# Patient Record
Sex: Male | Born: 1955 | Race: White | Hispanic: No | Marital: Married | Smoking: Never smoker
Health system: Southern US, Community
[De-identification: ages and names within clinical notes are randomized; demographics above are authoritative.]

## PROBLEM LIST (undated history)

## (undated) DIAGNOSIS — I639 Cerebral infarction, unspecified: Secondary | ICD-10-CM

## (undated) DIAGNOSIS — E785 Hyperlipidemia, unspecified: Secondary | ICD-10-CM

## (undated) DIAGNOSIS — R413 Other amnesia: Secondary | ICD-10-CM

## (undated) DIAGNOSIS — R7303 Prediabetes: Secondary | ICD-10-CM

## (undated) DIAGNOSIS — I10 Essential (primary) hypertension: Secondary | ICD-10-CM

## (undated) HISTORY — DX: Essential (primary) hypertension: I10

## (undated) HISTORY — DX: Other amnesia: R41.3

## (undated) HISTORY — DX: Hyperlipidemia, unspecified: E78.5

## (undated) HISTORY — DX: Prediabetes: R73.03

## (undated) HISTORY — DX: Cerebral infarction, unspecified: I63.9

---

## 2015-06-17 ENCOUNTER — Encounter: Payer: Self-pay | Admitting: Emergency Medicine

## 2015-06-17 ENCOUNTER — Emergency Department
Admission: EM | Admit: 2015-06-17 | Discharge: 2015-06-18 | Disposition: A | Discharge status: Home / Self Care | Payer: BLUE CROSS/BLUE SHIELD | Attending: Emergency Medicine | Admitting: Emergency Medicine

## 2015-06-17 ENCOUNTER — Emergency Department
Admission: EM | Admit: 2015-06-17 | Disposition: A | Discharge status: Home / Self Care | Payer: BLUE CROSS/BLUE SHIELD | Source: Home / Self Care

## 2015-06-17 DIAGNOSIS — S50812A Abrasion of left forearm, initial encounter: Secondary | ICD-10-CM | POA: Insufficient documentation

## 2015-06-17 DIAGNOSIS — S0990XA Unspecified injury of head, initial encounter: Secondary | ICD-10-CM | POA: Diagnosis present

## 2015-06-17 DIAGNOSIS — I10 Essential (primary) hypertension: Secondary | ICD-10-CM

## 2015-06-17 DIAGNOSIS — Y92410 Unspecified street and highway as the place of occurrence of the external cause: Secondary | ICD-10-CM | POA: Insufficient documentation

## 2015-06-17 DIAGNOSIS — Y998 Other external cause status: Secondary | ICD-10-CM | POA: Insufficient documentation

## 2015-06-17 DIAGNOSIS — S0081XA Abrasion of other part of head, initial encounter: Secondary | ICD-10-CM | POA: Diagnosis not present

## 2015-06-17 DIAGNOSIS — Y9389 Activity, other specified: Secondary | ICD-10-CM | POA: Diagnosis not present

## 2015-06-17 DIAGNOSIS — T148XXA Other injury of unspecified body region, initial encounter: Secondary | ICD-10-CM

## 2015-06-17 DIAGNOSIS — Z23 Encounter for immunization: Secondary | ICD-10-CM | POA: Diagnosis not present

## 2015-06-17 MED ORDER — LORAZEPAM 2 MG/ML IJ SOLN
1.0000 mg | Freq: Once | INTRAMUSCULAR | Status: AC
  Administered 2015-06-17: 1 mg via INTRAVENOUS

## 2015-06-17 MED ORDER — LORAZEPAM 2 MG/ML IJ SOLN
INTRAMUSCULAR | Status: AC
  Administered 2015-06-17: 1 mg via INTRAVENOUS
  Filled 2015-06-17: qty 1
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
## 2015-06-18 DIAGNOSIS — S0081XA Abrasion of other part of head, initial encounter: Secondary | ICD-10-CM | POA: Diagnosis not present

## 2015-06-18 MED ORDER — TETANUS-DIPHTH-ACELL PERTUSSIS 5-2.5-18.5 LF-MCG/0.5 IM SUSP
0.5000 mL | Freq: Once | INTRAMUSCULAR | Status: AC
  Administered 2015-06-18: 0.5 mL via INTRAMUSCULAR

## 2015-06-18 MED ORDER — TETANUS-DIPHTH-ACELL PERTUSSIS 5-2.5-18.5 LF-MCG/0.5 IM SUSP
INTRAMUSCULAR | Status: AC
  Administered 2015-06-18: 0.5 mL via INTRAMUSCULAR
  Filled 2015-06-18: qty 0.5

## 2015-06-18 MED ORDER — HYDROCHLOROTHIAZIDE 25 MG PO TABS: 25.0000 mg | ORAL_TABLET | Freq: Every day | ORAL | Status: DC
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
## 2015-07-30 ENCOUNTER — Ambulatory Visit (INDEPENDENT_AMBULATORY_CARE_PROVIDER_SITE_OTHER): Payer: BLUE CROSS/BLUE SHIELD | Admitting: Family Medicine

## 2015-07-30 ENCOUNTER — Encounter: Payer: Self-pay | Admitting: Family Medicine

## 2015-07-30 ENCOUNTER — Ambulatory Visit: Payer: BLUE CROSS/BLUE SHIELD | Admitting: Family Medicine

## 2015-07-30 VITALS — BP 220/120 | HR 80 | Temp 98.8°F | Ht 67.0 in | Wt 201.1 lb

## 2015-07-30 DIAGNOSIS — Z125 Encounter for screening for malignant neoplasm of prostate: Secondary | ICD-10-CM

## 2015-07-30 DIAGNOSIS — I160 Hypertensive urgency: Secondary | ICD-10-CM

## 2015-07-30 DIAGNOSIS — Z1159 Encounter for screening for other viral diseases: Secondary | ICD-10-CM

## 2015-07-30 DIAGNOSIS — I10 Essential (primary) hypertension: Secondary | ICD-10-CM | POA: Insufficient documentation

## 2015-07-30 DIAGNOSIS — E669 Obesity, unspecified: Secondary | ICD-10-CM

## 2015-07-30 DIAGNOSIS — Z1211 Encounter for screening for malignant neoplasm of colon: Secondary | ICD-10-CM

## 2015-07-30 DIAGNOSIS — Z0000 Encounter for general adult medical examination without abnormal findings: Secondary | ICD-10-CM

## 2015-07-30 MED ORDER — AMLODIPINE BESYLATE 10 MG PO TABS: 10.0000 mg | ORAL_TABLET | Freq: Every day | ORAL | Status: DC

## 2015-07-30 MED ORDER — HYDROCHLOROTHIAZIDE 25 MG PO TABS
25.0000 mg | ORAL_TABLET | Freq: Every day | ORAL | Status: DC
Start: 2015-07-30 — End: 2016-07-18
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
## 2015-07-30 NOTE — Patient Instructions (Signed)
It was nice to see you today.  Take the medications daily as prescribed.  Follow up in 1-2 weeks for a BP check.  We will call with the results of your labs.  Take care  Dr. Adriana Simas  Health Maintenance A healthy lifestyle and preventative care can promote health and wellness.  Maintain regular health, dental, and eye exams.  Eat a healthy diet. Foods like vegetables, fruits, whole grains, low-fat dairy products, and lean protein foods contain the nutrients you need and are low in calories. Decrease your intake of foods high in solid fats, added sugars, and salt. Get information about a proper diet from your health care provider, if necessary.  Regular physical exercise is one of the most important things you can do for your health. Most adults should get at least 150 minutes of moderate-intensity exercise (any activity that increases your heart rate and causes you to sweat) each week. In addition, most adults need muscle-strengthening exercises on 2 or more days a week.   Maintain a healthy weight. The body mass index (BMI) is a screening tool to identify possible weight problems. It provides an estimate of body fat based on height and weight. Your health care provider can find your BMI and can help you achieve or maintain a healthy weight. For males 20 years and older:  A BMI below 18.5 is considered underweight.  A BMI of 18.5 to 24.9 is normal.  A BMI of 25 to 29.9 is considered overweight.  A BMI of 30 and above is considered obese.  Maintain normal blood lipids and cholesterol by exercising and minimizing your intake of saturated fat. Eat a balanced diet with plenty of fruits and vegetables. Blood tests for lipids and cholesterol should begin at age 21 and be repeated every 5 years. If your lipid or cholesterol levels are high, you are over age 19, or you are at high risk for heart disease, you may need your cholesterol levels checked more frequently.Ongoing high lipid and  cholesterol levels should be treated with medicines if diet and exercise are not working.  If you smoke, find out from your health care provider how to quit. If you do not use tobacco, do not start.  Lung cancer screening is recommended for adults aged 55-80 years who are at high risk for developing lung cancer because of a history of smoking. A yearly low-dose CT scan of the lungs is recommended for people who have at least a 30-pack-year history of smoking and are current smokers or have quit within the past 15 years. A pack year of smoking is smoking an average of 1 pack of cigarettes a day for 1 year (for example, a 30-pack-year history of smoking could mean smoking 1 pack a day for 30 years or 2 packs a day for 15 years). Yearly screening should continue until the smoker has stopped smoking for at least 15 years. Yearly screening should be stopped for people who develop a health problem that would prevent them from having lung cancer treatment.  If you choose to drink alcohol, do not have more than 2 drinks per day. One drink is considered to be 12 oz (360 mL) of beer, 5 oz (150 mL) of wine, or 1.5 oz (45 mL) of liquor.  Avoid the use of street drugs. Do not share needles with anyone. Ask for help if you need support or instructions about stopping the use of drugs.  High blood pressure causes heart disease and increases the risk of  stroke. Blood pressure should be checked at least every 1-2 years. Ongoing high blood pressure should be treated with medicines if weight loss and exercise are not effective.  If you are 75-51 years old, ask your health care provider if you should take aspirin to prevent heart disease.  Diabetes screening involves taking a blood sample to check your fasting blood sugar level. This should be done once every 3 years after age 68 if you are at a normal weight and without risk factors for diabetes. Testing should be considered at a younger age or be carried out more  frequently if you are overweight and have at least 1 risk factor for diabetes.  Colorectal cancer can be detected and often prevented. Most routine colorectal cancer screening begins at the age of 81 and continues through age 40. However, your health care provider may recommend screening at an earlier age if you have risk factors for colon cancer. On a yearly basis, your health care provider may provide home test kits to check for hidden blood in the stool. A small camera at the end of a tube may be used to directly examine the colon (sigmoidoscopy or colonoscopy) to detect the earliest forms of colorectal cancer. Talk to your health care provider about this at age 74 when routine screening begins. A direct exam of the colon should be repeated every 5-10 years through age 11, unless early forms of precancerous polyps or small growths are found.  People who are at an increased risk for hepatitis B should be screened for this virus. You are considered at high risk for hepatitis B if:  You were born in a country where hepatitis B occurs often. Talk with your health care provider about which countries are considered high risk.  Your parents were born in a high-risk country and you have not received a shot to protect against hepatitis B (hepatitis B vaccine).  You have HIV or AIDS.  You use needles to inject street drugs.  You live with, or have sex with, someone who has hepatitis B.  You are a man who has sex with other men (MSM).  You get hemodialysis treatment.  You take certain medicines for conditions like cancer, organ transplantation, and autoimmune conditions.  Hepatitis C blood testing is recommended for all people born from [birth year] through 1965 and any individual with known risk factors for hepatitis C.  Healthy men should no longer receive prostate-specific antigen (PSA) blood tests as part of routine cancer screening. Talk to your health care provider about prostate cancer  screening.  Testicular cancer screening is not recommended for adolescents or adult males who have no symptoms. Screening includes self-exam, a health care provider exam, and other screening tests. Consult with your health care provider about any symptoms you have or any concerns you have about testicular cancer.  Practice safe sex. Use condoms and avoid high-risk sexual practices to reduce the spread of sexually transmitted infections (STIs).  You should be screened for STIs, including gonorrhea and chlamydia if:  You are sexually active and are younger than 24 years.  You are older than 24 years, and your health care provider tells you that you are at risk for this type of infection.  Your sexual activity has changed since you were last screened, and you are at an increased risk for chlamydia or gonorrhea. Ask your health care provider if you are at risk.  If you are at risk of being infected with HIV, it is recommended that  a prescription medicine daily to prevent HIV infection. This is called pre-exposure prophylaxis (PrEP). You are considered at risk if:  You are a man who has sex with other men (MSM).  You are a heterosexual man who is sexually active with multiple partners.  You take drugs by injection.  You are sexually active with a partner who has HIV.  Talk with your health care provider about whether you are at high risk of being infected with HIV. If you choose to begin PrEP, you should first be tested for HIV. You should then be tested every 3 months for as long as you are taking PrEP.  Use sunscreen. Apply sunscreen liberally and repeatedly throughout the day. You should seek shade when your shadow is shorter than you. Protect yourself by wearing long sleeves, pants, a wide-brimmed hat, and sunglasses year round whenever you are outdoors.  Tell your health care provider of new moles or changes in moles, especially if there is a change in shape or color. Also, tell  your health care provider if a mole is larger than the size of a pencil eraser.  A one-time screening for abdominal aortic aneurysm (AAA) and surgical repair of large AAAs by ultrasound is recommended for men aged 65-75 years who are current or former smokers.  Stay current with your vaccines (immunizations). Document Released: 05/26/2008 Document Revised: 12/03/2013 Document Reviewed: 04/25/2011 Exit[employer] Patient Information 2015 [employer]. This information is not intended to replace advice given to you by your health care provider. Make sure you discuss any questions you have with your health care provider.  

## 2015-07-30 NOTE — Progress Notes (Signed)
Pre visit review using our clinic review tool, if applicable. No additional management support is needed unless otherwise documented below in the visit note. 

## 2015-07-31 ENCOUNTER — Encounter: Payer: Self-pay | Admitting: Family Medicine

## 2015-07-31 DIAGNOSIS — Z0000 Encounter for general adult medical examination without abnormal findings: Secondary | ICD-10-CM | POA: Insufficient documentation

## 2015-07-31 DIAGNOSIS — I10 Essential (primary) hypertension: Secondary | ICD-10-CM | POA: Insufficient documentation

## 2015-07-31 LAB — COMPREHENSIVE METABOLIC PANEL
ALBUMIN: 4.7 g/dL (ref 3.5–5.2)
ALK PHOS: 67 U/L (ref 39–117)
ALT: 65 U/L — ABNORMAL HIGH (ref 0–53)
AST: 40 U/L — AB (ref 0–37)
BILIRUBIN TOTAL: 0.8 mg/dL (ref 0.2–1.2)
BUN: 12 mg/dL (ref 6–23)
CALCIUM: 9.9 mg/dL (ref 8.4–10.5)
CHLORIDE: 102 meq/L (ref 96–112)
CO2: 27 mEq/L (ref 19–32)
CREATININE: 1.16 mg/dL (ref 0.40–1.50)
GFR: 68.45 mL/min (ref >60.00)
Glucose, Bld: 100 mg/dL — ABNORMAL HIGH (ref 70–99)
Potassium: 4.3 mEq/L (ref 3.5–5.1)
Sodium: 140 mEq/L (ref 135–145)
TOTAL PROTEIN: 7.4 g/dL (ref 6.0–8.3)

## 2015-07-31 LAB — PSA: PSA: 1.15 ng/mL (ref 0.10–4.00)

## 2015-07-31 LAB — HEMOGLOBIN A1C: Hgb A1c MFr Bld: 5.4 % (ref 4.6–6.5)

## 2015-07-31 LAB — MICROALBUMIN / CREATININE URINE RATIO
Creatinine,U: 86 mg/dL
MICROALB UR: 1.2 mg/dL (ref 0.0–1.9)
Microalb Creat Ratio: 1.4 mg/g (ref 0.0–30.0)

## 2015-07-31 LAB — HEPATITIS C ANTIBODY: HCV Ab: NEGATIVE

## 2015-07-31 NOTE — Assessment & Plan Note (Signed)
Immunizations up to date. Labs: Lipid, CMP, A1C. Patient and I had a long discussion about prostate cancer screening. Will proceed with PSA screening. Placing referral for colonoscopy.

## 2015-07-31 NOTE — Assessment & Plan Note (Addendum)
Patient's blood pressure severely elevated today. Patient asymptomatic. With no physical exam signs of end organ damage. No prior labs in the EMR. Obtaining metabolic panel today to ensure no underlying renal disease or other abnormalities to suggest secondary cause of HTN. Starting patient on Norvasc and HCTZ. Follow up in 1-2 weeks for BP recheck.

## 2015-08-19 ENCOUNTER — Ambulatory Visit (INDEPENDENT_AMBULATORY_CARE_PROVIDER_SITE_OTHER): Payer: BLUE CROSS/BLUE SHIELD

## 2015-08-19 ENCOUNTER — Telehealth: Payer: Self-pay

## 2015-08-19 VITALS — BP 148/88 | HR 95 | Resp 18

## 2015-08-19 DIAGNOSIS — I10 Essential (primary) hypertension: Secondary | ICD-10-CM

## 2015-08-19 DIAGNOSIS — I160 Hypertensive urgency: Secondary | ICD-10-CM

## 2015-08-19 NOTE — Progress Notes (Signed)
Very pleased with BP reduction. Will continue current regimen.  Follow up BP check in 2 weeks. Goal <140/90

## 2015-08-19 NOTE — Progress Notes (Signed)
Patient came in for BP check.  Patient is BP checked in both arms.  Per patient he is taking both medications per ordered (Norvasc and HCTZ) daily.  Has had no major issues with the exception of one evening being lethargic and no other symptoms.  Please advise if you would like any changes.  Patient would like a return call at [phone].

## 2015-08-19 NOTE — Telephone Encounter (Signed)
-----   Message from Tommie Sams, DO sent at 08/19/2015  8:51 AM EDT -----   ----- Message -----    From: Elvia Collum, RN    Sent: 08/19/2015   8:15 AM      To: Tommie Sams, DO

## 2015-08-19 NOTE — Telephone Encounter (Signed)
Called patient to congratulate him on getting his BP down. He will call back and schedule his 2 wk BP check.

## 2015-08-25 ENCOUNTER — Other Ambulatory Visit (INDEPENDENT_AMBULATORY_CARE_PROVIDER_SITE_OTHER): Payer: BLUE CROSS/BLUE SHIELD

## 2015-08-25 ENCOUNTER — Other Ambulatory Visit (INDEPENDENT_AMBULATORY_CARE_PROVIDER_SITE_OTHER): Payer: BLUE CROSS/BLUE SHIELD | Admitting: *Deleted

## 2015-08-25 DIAGNOSIS — I10 Essential (primary) hypertension: Secondary | ICD-10-CM

## 2015-08-25 DIAGNOSIS — R945 Abnormal results of liver function studies: Principal | ICD-10-CM

## 2015-08-25 DIAGNOSIS — R7989 Other specified abnormal findings of blood chemistry: Secondary | ICD-10-CM

## 2015-08-26 LAB — COMPREHENSIVE METABOLIC PANEL
ALT: 36 U/L (ref 0–53)
AST: 23 U/L (ref 0–37)
Albumin: 4.4 g/dL (ref 3.5–5.2)
Alkaline Phosphatase: 68 U/L (ref 39–117)
BILIRUBIN TOTAL: 0.8 mg/dL (ref 0.2–1.2)
BUN: 20 mg/dL (ref 6–23)
CHLORIDE: 98 meq/L (ref 96–112)
CO2: 31 meq/L (ref 19–32)
Calcium: 10 mg/dL (ref 8.4–10.5)
Creatinine, Ser: 1.33 mg/dL (ref 0.40–1.50)
GFR: 58.44 mL/min — AB (ref >60.00)
Glucose, Bld: 102 mg/dL — ABNORMAL HIGH (ref 70–99)
POTASSIUM: 3.8 meq/L (ref 3.5–5.1)
Sodium: 139 mEq/L (ref 135–145)
Total Protein: 7 g/dL (ref 6.0–8.3)

## 2015-10-05 ENCOUNTER — Ambulatory Visit: Payer: BLUE CROSS/BLUE SHIELD | Admitting: *Deleted

## 2015-10-05 ENCOUNTER — Encounter
Admission: RE | Disposition: A | Discharge status: Home / Self Care | Payer: Self-pay | Source: Ambulatory Visit | Attending: Unknown Physician Specialty

## 2015-10-05 ENCOUNTER — Encounter: Payer: Self-pay | Admitting: Anesthesiology

## 2015-10-05 ENCOUNTER — Ambulatory Visit
Admission: RE | Admit: 2015-10-05 | Discharge: 2015-10-05 | Disposition: A | Discharge status: Home / Self Care | Payer: BLUE CROSS/BLUE SHIELD | Source: Ambulatory Visit | Attending: Unknown Physician Specialty | Admitting: Unknown Physician Specialty

## 2015-10-05 DIAGNOSIS — K640 First degree hemorrhoids: Secondary | ICD-10-CM | POA: Diagnosis not present

## 2015-10-05 DIAGNOSIS — K621 Rectal polyp: Secondary | ICD-10-CM | POA: Insufficient documentation

## 2015-10-05 DIAGNOSIS — K635 Polyp of colon: Secondary | ICD-10-CM | POA: Diagnosis not present

## 2015-10-05 DIAGNOSIS — Z1211 Encounter for screening for malignant neoplasm of colon: Secondary | ICD-10-CM | POA: Diagnosis not present

## 2015-10-05 DIAGNOSIS — I10 Essential (primary) hypertension: Secondary | ICD-10-CM | POA: Diagnosis not present

## 2015-10-05 HISTORY — PX: COLONOSCOPY WITH PROPOFOL: SHX5780

## 2015-10-05 SURGERY — COLONOSCOPY WITH PROPOFOL
Anesthesia: General

## 2015-10-05 MED ORDER — LACTATED RINGERS IV SOLN
INTRAVENOUS | Status: DC | PRN
  Administered 2015-10-05: 14:00:00 via INTRAVENOUS

## 2015-10-05 MED ORDER — SODIUM CHLORIDE 0.9 % IV SOLN
INTRAVENOUS | Status: DC
  Administered 2015-10-05: 14:00:00 via INTRAVENOUS

## 2015-10-05 MED ORDER — PROPOFOL 500 MG/50ML IV EMUL
INTRAVENOUS | Status: DC | PRN
  Administered 2015-10-05: 100 ug/kg/min via INTRAVENOUS

## 2015-10-05 MED ORDER — SODIUM CHLORIDE 0.9 % IV SOLN: INTRAVENOUS | Status: DC

## 2015-10-05 NOTE — Op Note (Signed)
[facility] Gastroenterology Patient Name: Jesse Black Procedure Date: 10/05/2015 2:03 PM MRN: [id number] Account #: [id number] Date of Birth: 07/22/1956 Admit Type: Outpatient Age: 59 Room: [facility] ENDO ROOM 1 Gender: Male Note Status: Finalized Procedure:         Colonoscopy Indications:       Screening for colorectal malignant neoplasm Providers:         Scot Jun, MD Referring MD:      No Local Md, MD (Referring MD) Medicines:         Propofol per Anesthesia Complications:     No immediate complications. Procedure:         Pre-Anesthesia Assessment:                    - After reviewing the risks and benefits, the patient was                     deemed in satisfactory condition to undergo the procedure.                    After obtaining informed consent, the colonoscope was                     passed under direct vision. Throughout the procedure, the                     patient's blood pressure, pulse, and oxygen saturations                     were monitored continuously. The Colonoscope was                     introduced through the anus and advanced to the the cecum,                     identified by appendiceal orifice and ileocecal valve. The                     colonoscopy was performed without difficulty. The patient                     tolerated the procedure well. The quality of the bowel                     preparation was excellent. Findings:      Two sessile polyps were found in the sigmoid colon. The polyps were       diminutive in size. These polyps were removed with a jumbo cold forceps.       Resection and retrieval were complete.      A diminutive polyp was found in the rectum. The polyp was sessile. The       polyp was removed with a jumbo cold forceps. Resection and retrieval       were complete.      The exam was otherwise without abnormality.      Internal hemorrhoids were found during endoscopy. The hemorrhoids were       small,  medium-sized and Grade I (internal hemorrhoids that do not       prolapse).      The exam was otherwise without abnormality. Impression:        - Two diminutive polyps in the sigmoid colon. Resected and  retrieved.                    - One diminutive polyp in the rectum. Resected and                     retrieved.                    - The examination was otherwise normal. Recommendation:    - Await pathology results. Scot Junobert T [name], MD 10/05/2015 2:31:48 PM This report has been signed electronically. Number of Addenda: 0 Note Initiated On: 10/05/2015 2:03 PM Scope Withdrawal Time: 0 hours 16 minutes 27 seconds  Total Procedure Duration: 0 hours 20 minutes 39 seconds       [facility]

## 2015-10-05 NOTE — Transfer of Care (Signed)
Immediate Anesthesia Transfer of Care Note  Patient: Jesse Black  Procedure(s) Performed: Procedure(s): COLONOSCOPY WITH PROPOFOL (N/A)  Patient Location: PACU  Anesthesia Type:General  Level of Consciousness: awake, alert  and oriented  Airway & Oxygen Therapy: Patient Spontanous Breathing and Patient connected to nasal cannula oxygen  Post-op Assessment: Report given to RN and Post -op Vital signs reviewed and stable  Post vital signs: Reviewed and stable  Last Vitals:  Filed Vitals:   10/05/15 1350  BP: 148/117  Pulse: 98  Temp: 36.8 C  Resp: 17    Complications: No apparent anesthesia complications

## 2015-10-05 NOTE — Anesthesia Preprocedure Evaluation (Addendum)
Anesthesia Evaluation  Patient identified by MRN, date of birth, ID band Patient awake, Patient confused and Patient unresponsive    Reviewed: Allergy & Precautions, H&P , NPO status , Patient's Chart, lab work & pertinent test results, reviewed documented beta blocker date and time   History of Anesthesia Complications Negative for: history of anesthetic complications  Airway Mallampati: II  TM Distance: >3 FB Neck ROM: full    Dental no notable dental hx.    Pulmonary neg pulmonary ROS,    Pulmonary exam normal breath sounds clear to auscultation       Cardiovascular Exercise Tolerance: Good hypertension, On Medications (-) angina(-) CAD, (-) Past MI, (-) Cardiac Stents and (-) CABG Normal cardiovascular exam(-) dysrhythmias (-) Valvular Problems/Murmurs Rhythm:Regular Rate:Normal     Neuro/Psych negative neurological ROS  negative psych ROS   GI/Hepatic negative GI ROS, Neg liver ROS,   Endo/Other  negative endocrine ROS  Renal/GU negative Renal ROS  negative genitourinary   Musculoskeletal negative musculoskeletal ROS (+)   Abdominal Normal abdominal exam  (+)   Peds negative pediatric ROS (+)  Hematology negative hematology ROS (+)   Anesthesia Other Findings Past Medical History:   High blood pressure                                          Reproductive/Obstetrics negative OB ROS                            Anesthesia Physical Anesthesia Plan  ASA: II  Anesthesia Plan: General   Post-op Pain Management:    Induction: Intravenous  Airway Management Planned:   Additional Equipment:   Intra-op Plan:   Post-operative Plan:   Informed Consent: I have reviewed the patients History and Physical, chart, labs and discussed the procedure including the risks, benefits and alternatives for the proposed anesthesia with the patient or authorized representative who has indicated  his/her understanding and acceptance.   Dental advisory given  Plan Discussed with: CRNA, Surgeon and Anesthesiologist  Anesthesia Plan Comments:        Anesthesia Quick Evaluation

## 2015-10-05 NOTE — H&P (Signed)
   Primary Care Physician:  Everlene OtherJayce Cook, DO Primary Gastroenterologist:  Dr. Mechele CollinElliott  Pre-Procedure History & Physical: HPI:  Jesse CossMark Austin Dorton is a 59 y.o. male is here for an colonoscopy.   Past Medical History  Diagnosis Date  . High blood pressure     Past Surgical History  Procedure Laterality Date  . No past surgeries      Prior to Admission medications   Medication Sig Start Date End Date Taking? Authorizing Provider  amLODipine (NORVASC) 10 MG tablet Take 1 tablet (10 mg total) by mouth daily. 07/30/15  Yes Tommie SamsJayce G Cook, DO  hydrochlorothiazide (HYDRODIURIL) 25 MG tablet Take 1 tablet (25 mg total) by mouth daily. 07/30/15   Tommie SamsJayce G Cook, DO    Allergies as of 09/04/2015  . (No Known Allergies)    Family History  Problem Relation Age of Onset  . Lung cancer Father     Social History   Social History  . Marital Status: Married    Spouse Name: N/A  . Number of Children: N/A  . Years of Education: N/A   Occupational History  . Not on file.   Social History Main Topics  . Smoking status: Never Smoker   . Smokeless tobacco: Never Used  . Alcohol Use: No     Comment: occ  . Drug Use: No  . Sexual Activity: Yes   Other Topics Concern  . Not on file   Social History Narrative    Review of Systems: See HPI, otherwise negative ROS  Physical Exam: BP 148/117 mmHg  Pulse 98  Temp(Src) 98.2 F (36.8 C) (Oral)  Resp 17  Ht 5\' 7"  (1.702 m)  Wt 86.183 kg (190 lb)  BMI 29.75 kg/m2  SpO2 100% General:   Alert,  pleasant and cooperative in NAD Head:  Normocephalic and atraumatic. Neck:  Supple; no masses or thyromegaly. Lungs:  Clear throughout to auscultation.    Heart:  Regular rate and rhythm. Abdomen:  Soft, nontender and nondistended. Normal bowel sounds, without guarding, and without rebound.   Neurologic:  Alert and  oriented x4;  grossly normal neurologically.  Impression/Plan: Jesse Black is here for an colonoscopy to be performed  for screening  Risks, benefits, limitations, and alternatives regarding  colonoscopy have been reviewed with the patient.  Questions have been answered.  All parties agreeable.   Lynnae PrudeELLIOTT, ROBERT, MD  10/05/2015, 1:58 PM

## 2015-10-06 NOTE — Anesthesia Postprocedure Evaluation (Signed)
  Anesthesia Post-op Note  Patient: Maximiano CossMark Austin Bogdon  Procedure(s) Performed: Procedure(s): COLONOSCOPY WITH PROPOFOL (N/A)  Anesthesia type:General  Patient location: PACU  Post pain: Pain level controlled  Post assessment: Post-op Vital signs reviewed, Patient's Cardiovascular Status Stable, Respiratory Function Stable, Patent Airway and No signs of Nausea or vomiting  Post vital signs: Reviewed and stable  Last Vitals:  Filed Vitals:   10/05/15 1500  BP: 119/80  Pulse:   Temp:   Resp:     Level of consciousness: awake, alert  and patient cooperative  Complications: No apparent anesthesia complications

## 2015-10-08 ENCOUNTER — Encounter: Payer: Self-pay | Admitting: Unknown Physician Specialty

## 2015-10-08 LAB — SURGICAL PATHOLOGY

## 2016-07-18 ENCOUNTER — Other Ambulatory Visit: Payer: Self-pay | Admitting: Family Medicine

## 2016-07-18 DIAGNOSIS — I160 Hypertensive urgency: Secondary | ICD-10-CM

## 2017-07-21 ENCOUNTER — Other Ambulatory Visit: Payer: Self-pay | Admitting: Family Medicine

## 2017-07-21 DIAGNOSIS — I160 Hypertensive urgency: Secondary | ICD-10-CM

## 2018-07-12 ENCOUNTER — Other Ambulatory Visit: Payer: Self-pay | Admitting: Family Medicine

## 2018-07-12 DIAGNOSIS — I160 Hypertensive urgency: Secondary | ICD-10-CM

## 2019-01-24 ENCOUNTER — Other Ambulatory Visit: Payer: Self-pay

## 2019-01-24 ENCOUNTER — Emergency Department: Payer: BLUE CROSS/BLUE SHIELD

## 2019-01-24 ENCOUNTER — Encounter: Payer: Self-pay | Admitting: Emergency Medicine

## 2019-01-24 ENCOUNTER — Inpatient Hospital Stay
Admission: EM | Admit: 2019-01-24 | Discharge: 2019-01-25 | DRG: 066 | Disposition: A | Discharge status: Home / Self Care | Payer: BLUE CROSS/BLUE SHIELD | Attending: Internal Medicine | Admitting: Internal Medicine

## 2019-01-24 DIAGNOSIS — R945 Abnormal results of liver function studies: Secondary | ICD-10-CM | POA: Diagnosis present

## 2019-01-24 DIAGNOSIS — I160 Hypertensive urgency: Secondary | ICD-10-CM | POA: Diagnosis not present

## 2019-01-24 DIAGNOSIS — Z79899 Other long term (current) drug therapy: Secondary | ICD-10-CM

## 2019-01-24 DIAGNOSIS — R29700 NIHSS score 0: Secondary | ICD-10-CM | POA: Diagnosis not present

## 2019-01-24 DIAGNOSIS — I6612 Occlusion and stenosis of left anterior cerebral artery: Secondary | ICD-10-CM | POA: Diagnosis not present

## 2019-01-24 DIAGNOSIS — I6381 Other cerebral infarction due to occlusion or stenosis of small artery: Principal | ICD-10-CM | POA: Diagnosis present

## 2019-01-24 DIAGNOSIS — I639 Cerebral infarction, unspecified: Secondary | ICD-10-CM | POA: Diagnosis not present

## 2019-01-24 DIAGNOSIS — R4789 Other speech disturbances: Secondary | ICD-10-CM | POA: Diagnosis present

## 2019-01-24 DIAGNOSIS — I10 Essential (primary) hypertension: Secondary | ICD-10-CM | POA: Diagnosis not present

## 2019-01-24 DIAGNOSIS — R260 Ataxic gait: Secondary | ICD-10-CM | POA: Diagnosis not present

## 2019-01-24 DIAGNOSIS — G459 Transient cerebral ischemic attack, unspecified: Secondary | ICD-10-CM | POA: Diagnosis not present

## 2019-01-24 DIAGNOSIS — I6602 Occlusion and stenosis of left middle cerebral artery: Secondary | ICD-10-CM | POA: Diagnosis not present

## 2019-01-24 DIAGNOSIS — I63231 Cerebral infarction due to unspecified occlusion or stenosis of right carotid arteries: Secondary | ICD-10-CM | POA: Diagnosis not present

## 2019-01-24 DIAGNOSIS — I63532 Cerebral infarction due to unspecified occlusion or stenosis of left posterior cerebral artery: Secondary | ICD-10-CM | POA: Diagnosis not present

## 2019-01-24 DIAGNOSIS — I6623 Occlusion and stenosis of bilateral posterior cerebral arteries: Secondary | ICD-10-CM | POA: Diagnosis not present

## 2019-01-24 DIAGNOSIS — E785 Hyperlipidemia, unspecified: Secondary | ICD-10-CM | POA: Diagnosis not present

## 2019-01-24 DIAGNOSIS — R4182 Altered mental status, unspecified: Secondary | ICD-10-CM | POA: Diagnosis not present

## 2019-01-24 LAB — CBC
HCT: 43.1 % (ref 39.0–52.0)
Hemoglobin: 15.5 g/dL (ref 13.0–17.0)
MCH: 34.1 pg — ABNORMAL HIGH (ref 26.0–34.0)
MCHC: 36 g/dL (ref 30.0–36.0)
MCV: 94.7 fL (ref 80.0–100.0)
NRBC: 0 % (ref 0.0–0.2)
PLATELETS: 224 10*3/uL (ref 150–400)
RBC: 4.55 MIL/uL (ref 4.22–5.81)
RDW: 12.7 % (ref 11.5–15.5)
WBC: 9 10*3/uL (ref 4.0–10.5)

## 2019-01-24 LAB — URINALYSIS, COMPLETE (UACMP) WITH MICROSCOPIC
BACTERIA UA: NONE SEEN
Bilirubin Urine: NEGATIVE
Glucose, UA: NEGATIVE mg/dL
Hgb urine dipstick: NEGATIVE
Ketones, ur: NEGATIVE mg/dL
Leukocytes,Ua: NEGATIVE
NITRITE: NEGATIVE
PH: 6 (ref 5.0–8.0)
Protein, ur: NEGATIVE mg/dL
SPECIFIC GRAVITY, URINE: 1.012 (ref 1.005–1.030)
SQUAMOUS EPITHELIAL / LPF: NONE SEEN (ref 0–5)

## 2019-01-24 LAB — COMPREHENSIVE METABOLIC PANEL
ALT: 106 U/L — AB (ref 0–44)
AST: 62 U/L — AB (ref 15–41)
Albumin: 4 g/dL (ref 3.5–5.0)
Alkaline Phosphatase: 56 U/L (ref 38–126)
Anion gap: 6 (ref 5–15)
BUN: 12 mg/dL (ref 8–23)
CHLORIDE: 103 mmol/L (ref 98–111)
CO2: 30 mmol/L (ref 22–32)
CREATININE: 1.25 mg/dL — AB (ref 0.61–1.24)
Calcium: 9 mg/dL (ref 8.9–10.3)
GFR calc Af Amer: >60 mL/min (ref >60)
GFR calc non Af Amer: >60 mL/min (ref >60)
Glucose, Bld: 117 mg/dL — ABNORMAL HIGH (ref 70–99)
POTASSIUM: 3.6 mmol/L (ref 3.5–5.1)
SODIUM: 139 mmol/L (ref 135–145)
Total Bilirubin: 1 mg/dL (ref 0.3–1.2)
Total Protein: 7.1 g/dL (ref 6.5–8.1)

## 2019-01-24 LAB — URINE DRUG SCREEN, QUALITATIVE (ARMC ONLY)
AMPHETAMINES, UR SCREEN: NOT DETECTED
Barbiturates, Ur Screen: NOT DETECTED
Benzodiazepine, Ur Scrn: NOT DETECTED
CANNABINOID 50 NG, UR ~~LOC~~: NOT DETECTED
COCAINE METABOLITE, UR ~~LOC~~: NOT DETECTED
MDMA (ECSTASY) UR SCREEN: NOT DETECTED
Methadone Scn, Ur: NOT DETECTED
Opiate, Ur Screen: NOT DETECTED
PHENCYCLIDINE (PCP) UR S: NOT DETECTED
TRICYCLIC, UR SCREEN: NOT DETECTED

## 2019-01-24 LAB — ETHANOL

## 2019-01-24 LAB — GLUCOSE, CAPILLARY: Glucose-Capillary: 132 mg/dL — ABNORMAL HIGH (ref 70–99)

## 2019-01-24 MED ORDER — OMEGA-3-ACID ETHYL ESTERS 1 G PO CAPS
1.0000 g | ORAL_CAPSULE | Freq: Every day | ORAL | Status: DC
  Administered 2019-01-24 – 2019-01-25 (×2): 1 g via ORAL
  Filled 2019-01-24 (×2): qty 1

## 2019-01-24 MED ORDER — ASPIRIN 81 MG PO CHEW
324.0000 mg | CHEWABLE_TABLET | Freq: Once | ORAL | Status: AC
  Administered 2019-01-24: 324 mg via ORAL
  Filled 2019-01-24: qty 4

## 2019-01-24 MED ORDER — ACETAMINOPHEN 325 MG PO TABS: 650.0000 mg | ORAL_TABLET | ORAL | Status: DC | PRN

## 2019-01-24 MED ORDER — LISINOPRIL 10 MG PO TABS
20.0000 mg | ORAL_TABLET | Freq: Every day | ORAL | Status: DC
  Administered 2019-01-24 – 2019-01-25 (×2): 20 mg via ORAL
  Filled 2019-01-24 (×2): qty 2

## 2019-01-24 MED ORDER — ACETAMINOPHEN 160 MG/5ML PO SOLN
650.0000 mg | ORAL | Status: DC | PRN
  Filled 2019-01-24: qty 20.3

## 2019-01-24 MED ORDER — ACETAMINOPHEN 650 MG RE SUPP: 650.0000 mg | RECTAL | Status: DC | PRN

## 2019-01-24 MED ORDER — ASPIRIN 325 MG PO TABS
325.0000 mg | ORAL_TABLET | Freq: Every day | ORAL | Status: DC
  Administered 2019-01-25: 325 mg via ORAL
  Filled 2019-01-24 (×2): qty 1

## 2019-01-24 MED ORDER — ENOXAPARIN SODIUM 40 MG/0.4ML ~~LOC~~ SOLN
40.0000 mg | SUBCUTANEOUS | Status: DC
  Administered 2019-01-24: 20:00:00 40 mg via SUBCUTANEOUS
  Filled 2019-01-24: qty 0.4

## 2019-01-24 MED ORDER — HYDRALAZINE HCL 50 MG PO TABS
25.0000 mg | ORAL_TABLET | Freq: Three times a day (TID) | ORAL | Status: DC
  Administered 2019-01-24 – 2019-01-25 (×4): 25 mg via ORAL
  Filled 2019-01-24 (×4): qty 1

## 2019-01-24 MED ORDER — STROKE: EARLY STAGES OF RECOVERY BOOK
Freq: Once | Status: AC
  Administered 2019-01-24: 17:00:00

## 2019-01-24 MED ORDER — SENNOSIDES-DOCUSATE SODIUM 8.6-50 MG PO TABS: 1.0000 | ORAL_TABLET | Freq: Every evening | ORAL | Status: DC | PRN

## 2019-01-24 MED ORDER — ADULT MULTIVITAMIN W/MINERALS CH
1.0000 | ORAL_TABLET | Freq: Every day | ORAL | Status: DC
  Administered 2019-01-25: 11:00:00 1 via ORAL
  Filled 2019-01-24: qty 1

## 2019-01-24 MED ORDER — ASPIRIN 300 MG RE SUPP: 300.0000 mg | Freq: Every day | RECTAL | Status: DC

## 2019-01-24 MED ORDER — CLONIDINE HCL 0.1 MG PO TABS
0.1000 mg | ORAL_TABLET | Freq: Once | ORAL | Status: AC
  Administered 2019-01-24: 0.1 mg via ORAL
  Filled 2019-01-24: qty 1

## 2019-01-24 MED ORDER — ATORVASTATIN CALCIUM 20 MG PO TABS
40.0000 mg | ORAL_TABLET | Freq: Every day | ORAL | Status: DC
  Administered 2019-01-24: 40 mg via ORAL
  Filled 2019-01-24: qty 2

## 2019-01-24 NOTE — ED Notes (Signed)
Po meds given. Awaiting verification to administer ordered meds.

## 2019-01-24 NOTE — ED Notes (Signed)
As per family patient demonstrating odd behavior. grasping for things that are not there. Appears incoherent at times, has missed work, speech is different. Rambling when conversation not really making cense. Family report change for over 1 week. Not able to bring him in to ed to be evaluated.

## 2019-01-24 NOTE — Progress Notes (Signed)
Advanced care plan. Purpose of the Encounter: CODE STATUS Parties in Attendance:Patient and family Patient's Decision Capacity:Good Subjective/Patient's story: Presented for confusion and slow speech Objective/Medical story Mental status better Mri brain revealed cva Needs stroke work up and further stroke work up Goals of care determination:  Advance care directives and goals of care discussed Patient wants everything done which includes cpr, intubation if need arises CODE STATUS: Full code Time spent discussing advanced care planning: 16 minutes

## 2019-01-24 NOTE — ED Notes (Signed)
Report given to receiving nurse. Patient to floor.  

## 2019-01-24 NOTE — ED Provider Notes (Signed)
[facility] Emergency Department Provider Note  Time seen: 12:38 PM  I have reviewed the triage vital signs and the nursing notes.   HISTORY  Chief Complaint Altered Mental Status    HPI Jesse Black is a 63 y.o. male with a past medical history of hypertension presents to the emergency department for altered mental status.  Currently the patient is awake alert oriented x4, he has no complaints.  States he is not noticed any confusion or changes in behavior.  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  They state over the past 1 week or so the patient has not been acting his self.  States he did not show up to work 1 day this week which is extremely abnormal, when he did show up today he seemed confused at times.  The business partner who is here states he is typically very sharp and something "seemed off."  Wife states over the past 1 week he has been late to dinner several times which is very abnormal for him.  States there is been things happening here and there that seem abnormal states the friends have noticed this as well, but she cannot describe any other specific instances.   Past Medical History:  Diagnosis Date  . High blood pressure     Patient Active Problem List   Diagnosis Date Noted  . Preventative health care 07/31/2015  . Hypertension 07/31/2015  . Hypertensive urgency 07/30/2015    Past Surgical History:  Procedure Laterality Date  . COLONOSCOPY WITH PROPOFOL N/A 10/05/2015   Procedure: COLONOSCOPY WITH PROPOFOL;  Surgeon: Scot Junobert T Elliott, MD;  [facility] ENDOSCOPY;  Service: Endoscopy;  Laterality: N/A;  . NO PAST SURGERIES      Prior to Admission medications   Medication Sig Start Date End Date Taking? Authorizing Provider  amLODipine (NORVASC) 10 MG tablet Take 1 tablet (10 mg total) by mouth daily. 07/30/15   Tommie Samsook, Jayce G, DO  hydrochlorothiazide (HYDRODIURIL) 25 MG tablet TAKE 1 TABLET (25 MG TOTAL) BY MOUTH  DAILY. *NEEDS 1 YEAR FOLLOW UP* 07/21/17   Tommie Samsook, Jayce G, DO    Not on File  Family History  Problem Relation Age of Onset  . Lung cancer Father     Social History Social History   Tobacco Use  . Smoking status: Never Smoker  . Smokeless tobacco: Never Used  Substance Use Topics  . Alcohol use: No    Comment: occ  . Drug use: No    Review of Systems Constitutional: Negative for fever. Cardiovascular: Negative for chest pain. Respiratory: Negative for shortness of breath. Gastrointestinal: Negative for abdominal pain, vomiting and diarrhea. Genitourinary: Negative for urinary compaints Musculoskeletal: Negative for musculoskeletal complaints Skin: Negative for skin complaints  Neurological: Negative for headache All other ROS negative  ____________________________________________   PHYSICAL EXAM:  VITAL SIGNS: ED Triage Vitals  Enc Vitals Group     BP 01/24/19 1210 (!) 224/114     Pulse Rate 01/24/19 1158 81     Resp 01/24/19 1158 18     Temp 01/24/19 1158 98.1 F (36.7 C)     Temp Source 01/24/19 1158 Oral     SpO2 01/24/19 1158 100 %     Weight 01/24/19 1210 180 lb (81.6 kg)     Height 01/24/19 1210 5\' 7"  (1.702 m)     Head Circumference --      Peak Flow --      Pain Score 01/24/19 1210  0     Pain Loc --      Pain Edu? --      Excl. in GC? --    Constitutional: Alert and oriented. Well appearing and in no distress. Eyes: Normal exam ENT   Head: Normocephalic and atraumatic.   Mouth/Throat: Mucous membranes are moist. Cardiovascular: Normal rate, regular rhythm. No murmur Respiratory: Normal respiratory effort without tachypnea nor retractions. Breath sounds are clear  Gastrointestinal: Soft and nontender. No distention.   Musculoskeletal: Nontender with normal range of motion in all extremities.  Neurologic:  Normal speech and language. No gross focal neurologic deficits.  Patient has equal grip strength bilaterally.  No pronator drift.  5/5  motor in all extremities without lower extremity drift.  Intact finger-to-nose testing.  Cranial nerves intact. Skin:  Skin is warm, dry and intact.  Psychiatric: Mood and affect are normal. Speech and behavior are normal.   ____________________________________________    EKG  Patient is EKG viewed and interpreted by myself shows a normal sinus rhythm at 82 bpm with a narrow QRS, normal axis, normal intervals, no concerning ST changes.  ____________________________________________    RADIOLOGY  IMPRESSION: 1. Small lacunar infarcts within the LEFT thalamus and LEFT external capsule, favored to be remote or subacute. 2. Periventricular white matter changes consistent with small vessel disease. 3. No evidence for acute intracranial abnormality.  ____________________________________________   INITIAL IMPRESSION / ASSESSMENT AND PLAN / ED COURSE  Pertinent labs & imaging results that were available during my care of the patient were reviewed by me and considered in my medical decision making (see chart for details).  Patient presents to the emergency department with altered mental status.  Currently the patient is awake alert oriented x4 normal neurological examination normal physical examination.  However family is very concerned.  We will check labs, CT scan of the head, urinalysis and continue to closely monitor.  Patient agreeable to work-up.  Patient CT concerning for lacunar infarcts subacute versus chronic.  The remainder of the patient's blood work is largely within normal limits.  Patient does remain quite hypertensive currently 229/105.  Patient states a history of hypertension, was only on hydrochlorothiazide however states he ran out of this medication several months ago.  We will dose a one-time dose of clonidine, continue to closely monitor.  We will proceed with MRI of the brain to further evaluate.  MRI positive for acute CVA.  We will admit to the hospitalist  service.   NIH Stroke Scale   Interval: Baseline Time: 2:03 PM Person Administering Scale: Minna AntisKevin Olson Lucarelli  Administer stroke scale items in the order listed. Record performance in each category after each subscale exam. Do not go back and change scores. Follow directions provided for each exam technique. Scores should reflect what the patient does, not what the clinician thinks the patient can do. The clinician should record answers while administering the exam and work quickly. Except where indicated, the patient should not be coached (i.e., repeated requests to patient to make a special effort).   1a  Level of consciousness: 0=alert; keenly responsive  1b. LOC questions:  0=Performs both tasks correctly  1c. LOC commands: 0=Performs both tasks correctly  2.  Best Gaze: 0=normal  3.  Visual: 0=No visual loss  4. Facial Palsy: 0=Normal symmetric movement  5a.  Motor left arm: 0=No drift, limb holds 90 (or 45) degrees for full 10 seconds  5b.  Motor right arm: 0=No drift, limb holds 90 (or 45) degrees for full  10 seconds  6a. motor left leg: 0=No drift, limb holds 90 (or 45) degrees for full 10 seconds  6b  Motor right leg:  0=No drift, limb holds 90 (or 45) degrees for full 10 seconds  7. Limb Ataxia: 0=Absent  8.  Sensory: 0=Normal; no sensory loss  9. Best Language:  0=No aphasia, normal  10. Dysarthria: 0=Normal  11. Extinction and Inattention: 0=No abnormality  12. Distal motor function: 0=Normal   Total:   0    ____________________________________________   FINAL CLINICAL IMPRESSION(S) / ED DIAGNOSES  Altered mental status Acute CVA   Minna Antis, MD 01/24/19 1523

## 2019-01-24 NOTE — ED Notes (Signed)
ED TO INPATIENT HANDOFF REPORT  Name/Age/Gender Jesse Black 63 y.o. male  Code Status   Home/SNF/Other from home   Chief Complaint poss stroke  Level of Care/Admitting Diagnosis ED Disposition    ED Disposition Condition Comment   Admit  Hospital Area: [facility] [[id number]]  Level of Care: Med-Surg [16]  Diagnosis: CVA (cerebral vascular accident) [facility]) [[id number]]  Admitting Physician: Ihor Austin [[id number]]  Attending Physician: Ihor Austin [[id number]]  Estimated length of stay: past midnight tomorrow  Certification:: I certify this patient will need inpatient services for at least 2 midnights  PT Class (Do Not Modify): Inpatient [101]  PT Acc Code (Do Not Modify): Private [1]       Medical History Past Medical History:  Diagnosis Date  . High blood pressure     Allergies Not on File  IV Location/Drains/Wounds Patient Lines/Drains/Airways Status   Active Line/Drains/Airways    Name:   Placement date:   Placement time:   Site:   Days:   Peripheral IV 01/24/19 Right Antecubital   01/24/19    1238    Antecubital   less than 1          Labs/Imaging Results for orders placed or performed during the hospital encounter of 01/24/19 (from the past 48 hour(s))  Glucose, capillary     Status: Abnormal   Collection Time: 01/24/19 11:59 AM  Result Value Ref Range   Glucose-Capillary 132 (H) 70 - 99 mg/dL  CBC     Status: Abnormal   Collection Time: 01/24/19 12:39 PM  Result Value Ref Range   WBC 9.0 4.0 - 10.5 K/uL   RBC 4.55 4.22 - 5.81 MIL/uL   Hemoglobin 15.5 13.0 - 17.0 g/dL   HCT 69.4 85.4 - 62.7 %   MCV 94.7 80.0 - 100.0 fL   MCH 34.1 (H) 26.0 - 34.0 pg   MCHC 36.0 30.0 - 36.0 g/dL   RDW 03.5 00.9 - 38.1 %   Platelets 224 150 - 400 K/uL   nRBC 0.0 0.0 - 0.2 %    Comment: Performed at [facility], [address] [place], [place] [address]  Comprehensive metabolic panel     Status: Abnormal   Collection Time:  01/24/19 12:39 PM  Result Value Ref Range   Sodium 139 135 - 145 mmol/L   Potassium 3.6 3.5 - 5.1 mmol/L   Chloride 103 98 - 111 mmol/L   CO2 30 22 - 32 mmol/L   Glucose, Bld 117 (H) 70 - 99 mg/dL   BUN 12 8 - 23 mg/dL   Creatinine, Ser 7.16 (H) 0.61 - 1.24 mg/dL   Calcium 9.0 8.9 - 96.7 mg/dL   Total Protein 7.1 6.5 - 8.1 g/dL   Albumin 4.0 3.5 - 5.0 g/dL   AST 62 (H) 15 - 41 U/L   ALT 106 (H) 0 - 44 U/L   Alkaline Phosphatase 56 38 - 126 U/L   Total Bilirubin 1.0 0.3 - 1.2 mg/dL   GFR calc non Af Amer >60 >60 mL/min   GFR calc Af Amer >60 >60 mL/min   Anion gap 6 5 - 15    Comment: Performed at [facility], [address] [place], [place] [address]  Ethanol     Status: None   Collection Time: 01/24/19 12:39 PM  Result Value Ref Range   Alcohol, Ethyl (B) <10 <10 mg/dL    Comment: (NOTE) Lowest detectable limit for serum alcohol is 10 mg/dL. For  medical purposes only. Performed at [facility], [address] [place], [place] [address]   Urine Drug Screen, [facility] only)     Status: None   Collection Time: 01/24/19  1:31 PM  Result Value Ref Range   Tricyclic, Ur Screen NONE DETECTED NONE DETECTED   Amphetamines, Ur Screen NONE DETECTED NONE DETECTED   MDMA (Ecstasy)Ur Screen NONE DETECTED NONE DETECTED   Cocaine Metabolite,Ur [place] NONE DETECTED NONE DETECTED   Opiate, Ur Screen NONE DETECTED NONE DETECTED   Phencyclidine (PCP) Ur S NONE DETECTED NONE DETECTED   Cannabinoid 50 Ng, Ur [place] NONE DETECTED NONE DETECTED   Barbiturates, Ur Screen NONE DETECTED NONE DETECTED   Benzodiazepine, Ur Scrn NONE DETECTED NONE DETECTED   Methadone Scn, Ur NONE DETECTED NONE DETECTED    Comment: (NOTE) Tricyclics + metabolites, urine    Cutoff 1000 ng/mL Amphetamines + metabolites, urine  Cutoff 1000 ng/mL MDMA (Ecstasy), urine              Cutoff 500 ng/mL Cocaine Metabolite, urine          Cutoff 300 ng/mL Opiate + metabolites, urine        Cutoff 300  ng/mL Phencyclidine (PCP), urine         Cutoff 25 ng/mL Cannabinoid, urine                 Cutoff 50 ng/mL Barbiturates + metabolites, urine  Cutoff 200 ng/mL Benzodiazepine, urine              Cutoff 200 ng/mL Methadone, urine                   Cutoff 300 ng/mL The urine drug screen provides only a preliminary, unconfirmed analytical test result and should not be used for non-medical purposes. Clinical consideration and professional judgment should be applied to any positive drug screen result due to possible interfering substances. A more specific alternate chemical method must be used in order to obtain a confirmed analytical result. Gas chromatography / mass spectrometry (GC/MS) is the preferred confirmat ory method. Performed at [facility], [address] [place], [place] [address]   Urinalysis, Complete w Microscopic     Status: Abnormal   Collection Time: 01/24/19  1:31 PM  Result Value Ref Range   Color, Urine YELLOW (A) YELLOW   APPearance CLEAR (A) CLEAR   Specific Gravity, Urine 1.012 1.005 - 1.030   pH 6.0 5.0 - 8.0   Glucose, UA NEGATIVE NEGATIVE mg/dL   Hgb urine dipstick NEGATIVE NEGATIVE   Bilirubin Urine NEGATIVE NEGATIVE   Ketones, ur NEGATIVE NEGATIVE mg/dL   Protein, ur NEGATIVE NEGATIVE mg/dL   Nitrite NEGATIVE NEGATIVE   Leukocytes,Ua NEGATIVE NEGATIVE   WBC, UA 0-5 0 - 5 WBC/hpf   Bacteria, UA NONE SEEN NONE SEEN   Squamous Epithelial / LPF NONE SEEN 0 - 5   Mucus PRESENT    Hyaline Casts, UA PRESENT     Comment: Performed at [facility], [address] [place], [place] [address]   Ct Head Wo Contrast  Result Date: 01/24/2019 CLINICAL DATA:  Pt arrives with family with concerns over increased altered mental status since Friday. Pt appropriate in triage; however family reports bizarre behavior. LKW last Friday. EXAM: CT HEAD WITHOUT CONTRAST TECHNIQUE: Contiguous axial images were obtained from the base of the skull through the  vertex without intravenous contrast. COMPARISON:  None. FINDINGS: Brain: Small lacunar infarcts are identified within the LEFT  thalamus and LEFT external capsule and favored to be remote or less likely, subacute. There are periventricular white matter changes consistent with small vessel disease. There is no intra or extra-axial fluid collection or mass lesion. The basilar cisterns and ventricles have a normal appearance. There is no CT evidence for acute infarction or hemorrhage. Vascular: No hyperdense vessel or unexpected calcification. Skull: Normal. Negative for fracture or focal lesion. Sinuses/Orbits: No acute finding. Other: None. IMPRESSION: 1. Small lacunar infarcts within the LEFT thalamus and LEFT external capsule, favored to be remote or subacute. 2. Periventricular white matter changes consistent with small vessel disease. 3. No evidence for acute intracranial abnormality. Electronically Signed   By: Norva PavlovElizabeth  Brown M.D.   On: 01/24/2019 13:19   Mr Brain Wo Contrast  Result Date: 01/24/2019 CLINICAL DATA:  Behavioral disturbance. Hallucinating. Symptoms developing over the last week. EXAM: MRI HEAD WITHOUT CONTRAST TECHNIQUE: Multiplanar, multiecho pulse sequences of the brain and surrounding structures were obtained without intravenous contrast. COMPARISON:  Head CT same day FINDINGS: Brain: Diffusion imaging shows a 1.2 cm acute infarction at the junction of the left cerebral peduncle, anterior thalamus and radiating white matter tracts. No other acute infarction or other cause of restricted diffusion. There are multifocal white matter lesions affecting the cerebral hemispheric deep and subcortical white matter. One of the lesions in the left posterior frontal white matter shows fluid intensity material centrally and shows some T2 shine through on diffusion imaging along the margins of that. There is an old small vessel infarction in the right thalamus. There is an old small vessel infarction in  the left pons with hemosiderin deposition. Certainly, in this patient with hypertension, small vessel infarctions could explain this entire picture. However, one could at least consider the possibility, though unlikely, of demyelinating disease in this case. Vascular: Major vessels at the base of the brain show flow. Skull and upper cervical spine: Negative Sinuses/Orbits: Clear/normal Other: None IMPRESSION: 1.2 cm acute infarction at the base of the brain on the left at the junction of the cerebral peduncle, anterior thalamus and radiating white matter tracts. Extensive old small vessel type infarctions elsewhere throughout the brain as outlined above. One could consider the possibility of demyelinating disease coexisting with small-vessel disease in this case, but that is less likely. Electronically Signed   By: Paulina FusiMark  Shogry M.D.   On: 01/24/2019 15:06    Pending Labs [employer] (From admission, onward)    Start     Ordered   Signed and Held  HIV antibody (Routine Testing)  Once,   R     Signed and Held   Signed and Held  Lipid panel  Tomorrow morning,   R    Comments:  Fasting    Signed and Held   Signed and Held  CBC  (enoxaparin (LOVENOX)    CrCl >/= 30 ml/min)  Once,   R    Comments:  Baseline for enoxaparin therapy IF NOT ALREADY DRAWN.  Notify MD if PLT < 100 K.    Signed and Held   Signed and Held  Creatinine, serum  (enoxaparin (LOVENOX)    CrCl >/= 30 ml/min)  Once,   R    Comments:  Baseline for enoxaparin therapy IF NOT ALREADY DRAWN.    Signed and Held   Signed and Held  Creatinine, serum  (enoxaparin (LOVENOX)    CrCl >/= 30 ml/min)  Weekly,   R    Comments:  while on enoxaparin therapy  Signed and Held          Vitals/Pain Today's Vitals   01/24/19 1243 01/24/19 1331 01/24/19 1400 01/24/19 1604  BP: (!) 161/150 (!) 229/105 (!) 199/114 (!) 171/106  Pulse: 68 75 67 72  Resp: 18 17  20   Temp: 98.6 F (37 C)     TempSrc:      SpO2: 100% 100% 96% 99%  Weight:       Height:      PainSc: 0-No pain 0-No pain  0-No pain    Isolation Precautions No active isolations  Medications Medications  atorvastatin (LIPITOR) tablet 40 mg (has no administration in time range)  lisinopril (PRINIVIL,ZESTRIL) tablet 20 mg (has no administration in time range)  hydrALAZINE (APRESOLINE) tablet 25 mg (has no administration in time range)  cloNIDine (CATAPRES) tablet 0.1 mg (0.1 mg Oral Given 01/24/19 1509)  aspirin chewable tablet 324 mg (324 mg Oral Given 01/24/19 1601)    Mobility ambulatory

## 2019-01-24 NOTE — ED Triage Notes (Signed)
Pt arrives with family with concerns over increased altered mental status since Friday. Pt appropriate in triage; however family reports bizarre behavior. LKW last Friday.

## 2019-01-24 NOTE — H&P (Signed)
[facility]  at [facility]   PATIENT NAME: Jesse FilbertMark Prigmore    MR#:  [id number]  DATE OF BIRTH:  03/25/1956  DATE OF ADMISSION:  01/24/2019  PRIMARY CARE PHYSICIAN: McLean-Scocuzza, Pasty Spillersracy N, MD   REQUESTING/REFERRING PHYSICIAN:   CHIEF COMPLAINT:   Chief Complaint  Patient presents with  . Altered Mental Status    HISTORY OF PRESENT ILLNESS: Jesse Black  is a 63 y.o. male with a known history of high blood pressure was initially brought to the emergency room for confusion.  [name] little confused and slow in speech.  Not been taking his blood pressure medication for the last couple of days.  According to patient's family he has slow speech for the last few days.  Blood pressure has been high today around like systolic 200 mmHg.  Patient was worked up for stroke.  MRI brain revealed 1.2 cm acute stroke in the thalamus area and basal cerebral peduncle.  Patient is awake alert and oriented to time place and person.  Speech appears to be better now in the emergency room.  He was given aspirin.  PAST MEDICAL HISTORY:   Past Medical History:  Diagnosis Date  . High blood pressure     PAST SURGICAL HISTORY:  Past Surgical History:  Procedure Laterality Date  . COLONOSCOPY WITH PROPOFOL N/A 10/05/2015   Procedure: COLONOSCOPY WITH PROPOFOL;  Surgeon: Scot Junobert T Elliott, MD;  [facility];  Service: Endoscopy;  Laterality: N/A;  . NO PAST SURGERIES      SOCIAL HISTORY:  Social History   Tobacco Use  . Smoking status: Never Smoker  . Smokeless tobacco: Never Used  Substance Use Topics  . Alcohol use: No    Comment: occ    FAMILY HISTORY:  Family History  Problem Relation Age of Onset  . Lung cancer Father     DRUG ALLERGIES: Not on File  REVIEW OF SYSTEMS:   CONSTITUTIONAL: No fever, fatigue or weakness.  EYES: No blurred or double vision.  EARS, NOSE, AND THROAT: No tinnitus or ear pain.   RESPIRATORY: No cough, shortness of breath, wheezing or hemoptysis.  CARDIOVASCULAR: No chest pain, orthopnea, edema.  GASTROINTESTINAL: No nausea, vomiting, diarrhea or abdominal pain.  GENITOURINARY: No dysuria, hematuria.  ENDOCRINE: No polyuria, nocturia,  HEMATOLOGY: No anemia, easy bruising or bleeding SKIN: No rash or lesion. MUSCULOSKELETAL: No joint pain or arthritis.   NEUROLOGIC: No tingling, numbness, weakness.  Slow speech Was confused prior to presentation PSYCHIATRY: No anxiety or depression.   MEDICATIONS AT HOME:  Prior to Admission medications   Medication Sig Start Date End Date Taking? Authorizing Provider  hydrochlorothiazide (HYDRODIURIL) 25 MG tablet TAKE 1 TABLET (25 MG TOTAL) BY MOUTH DAILY. *NEEDS 1 YEAR FOLLOW UP* Patient taking differently: Take 25 mg by mouth daily.  07/21/17  Yes Cook, Dorie RankJayce G, DO  Multiple Vitamin (MULTI-VITAMIN DAILY) TABS Take 1 tablet by mouth daily.   Yes [provider]  omega-3 acid ethyl esters (LOVAZA) 1 g capsule Take 1 g by mouth daily.   Yes [provider]      PHYSICAL EXAMINATION:   VITAL SIGNS: Blood pressure (!) 199/114, pulse 67, temperature 98.6 F (37 C), resp. rate 17, height 5\' 7"  (1.702 m), weight 81.6 kg, SpO2 96 %.  GENERAL:  63 y.o.-year-old patient lying in the bed with no acute distress.  EYES: Pupils equal, round, reactive to light and accommodation. No scleral icterus. Extraocular muscles intact.  HEENT: Head atraumatic, normocephalic. Oropharynx and nasopharynx clear.  NECK:  Supple, no jugular venous distention. No thyroid enlargement, no tenderness.  LUNGS: Normal breath sounds bilaterally, no wheezing, rales,rhonchi or crepitation. No use of accessory muscles of respiration.  CARDIOVASCULAR: S1, S2 normal. No murmurs, rubs, or gallops.  ABDOMEN: Soft, nontender, nondistended. Bowel sounds present. No organomegaly or mass.  EXTREMITIES: No pedal edema, cyanosis, or clubbing.   NEUROLOGIC: Cranial nerves II through XII are intact. Muscle strength 5/5 in all extremities. Sensation intact. Gait not checked.  PSYCHIATRIC: The patient is alert and oriented x 3.  SKIN: No obvious rash, lesion, or ulcer.   LABORATORY PANEL:   CBC Recent Labs  Lab 01/24/19 1239  WBC 9.0  HGB 15.5  HCT 43.1  PLT 224  MCV 94.7  MCH 34.1*  MCHC 36.0  RDW 12.7   ------------------------------------------------------------------------------------------------------------------  Chemistries  Recent Labs  Lab 01/24/19 1239  NA 139  K 3.6  CL 103  CO2 30  GLUCOSE 117*  BUN 12  CREATININE 1.25*  CALCIUM 9.0  AST 62*  ALT 106*  ALKPHOS 56  BILITOT 1.0   ------------------------------------------------------------------------------------------------------------------ estimated creatinine clearance is 62.7 mL/min (A) (by C-G formula based on SCr of 1.25 mg/dL (H)). ------------------------------------------------------------------------------------------------------------------ No results for input(s): TSH, T4TOTAL, T3FREE, THYROIDAB in the last 72 hours.  Invalid input(s): FREET3   Coagulation profile No results for input(s): INR, PROTIME in the last 168 hours. ------------------------------------------------------------------------------------------------------------------- No results for input(s): DDIMER in the last 72 hours. -------------------------------------------------------------------------------------------------------------------  Cardiac Enzymes No results for input(s): CKMB, TROPONINI, MYOGLOBIN in the last 168 hours.  Invalid input(s): CK ------------------------------------------------------------------------------------------------------------------ Invalid input(s): POCBNP  ---------------------------------------------------------------------------------------------------------------  Urinalysis    Component Value Date/Time   COLORURINE  YELLOW (A) 01/24/2019 1331   APPEARANCEUR CLEAR (A) 01/24/2019 1331   LABSPEC 1.012 01/24/2019 1331   PHURINE 6.0 01/24/2019 1331   GLUCOSEU NEGATIVE 01/24/2019 1331   HGBUR NEGATIVE 01/24/2019 1331   BILIRUBINUR NEGATIVE 01/24/2019 1331   KETONESUR NEGATIVE 01/24/2019 1331   PROTEINUR NEGATIVE 01/24/2019 1331   NITRITE NEGATIVE 01/24/2019 1331   LEUKOCYTESUR NEGATIVE 01/24/2019 1331     RADIOLOGY: Ct Head Wo Contrast  Result Date: 01/24/2019 CLINICAL DATA:  Pt arrives with family with concerns over increased altered mental status since Friday. Pt appropriate in triage; however family reports bizarre behavior. LKW last Friday. EXAM: CT HEAD WITHOUT CONTRAST TECHNIQUE: Contiguous axial images were obtained from the base of the skull through the vertex without intravenous contrast. COMPARISON:  None. FINDINGS: Brain: Small lacunar infarcts are identified within the LEFT thalamus and LEFT external capsule and favored to be remote or less likely, subacute. There are periventricular white matter changes consistent with small vessel disease. There is no intra or extra-axial fluid collection or mass lesion. The basilar cisterns and ventricles have a normal appearance. There is no CT evidence for acute infarction or hemorrhage. Vascular: No hyperdense vessel or unexpected calcification. Skull: Normal. Negative for fracture or focal lesion. Sinuses/Orbits: No acute finding. Other: None. IMPRESSION: 1. Small lacunar infarcts within the LEFT thalamus and LEFT external capsule, favored to be remote or subacute. 2. Periventricular white matter changes consistent with small vessel disease. 3. No evidence for acute intracranial abnormality. Electronically Signed   By: Norva Pavlov M.D.   On: 01/24/2019 13:19   Mr Brain Wo Contrast  Result Date: 01/24/2019 CLINICAL DATA:  Behavioral disturbance. Hallucinating. Symptoms developing over the last week. EXAM: MRI HEAD WITHOUT CONTRAST TECHNIQUE: Multiplanar,  multiecho pulse sequences of the brain and  surrounding structures were obtained without intravenous contrast. COMPARISON:  Head CT same day FINDINGS: Brain: Diffusion imaging shows a 1.2 cm acute infarction at the junction of the left cerebral peduncle, anterior thalamus and radiating white matter tracts. No other acute infarction or other cause of restricted diffusion. There are multifocal white matter lesions affecting the cerebral hemispheric deep and subcortical white matter. One of the lesions in the left posterior frontal white matter shows fluid intensity material centrally and shows some T2 shine through on diffusion imaging along the margins of that. There is an old small vessel infarction in the right thalamus. There is an old small vessel infarction in the left pons with hemosiderin deposition. Certainly, in this patient with hypertension, small vessel infarctions could explain this entire picture. However, one could at least consider the possibility, though unlikely, of demyelinating disease in this case. Vascular: Major vessels at the base of the brain show flow. Skull and upper cervical spine: Negative Sinuses/Orbits: Clear/normal Other: None IMPRESSION: 1.2 cm acute infarction at the base of the brain on the left at the junction of the cerebral peduncle, anterior thalamus and radiating white matter tracts. Extensive old small vessel type infarctions elsewhere throughout the brain as outlined above. One could consider the possibility of demyelinating disease coexisting with small-vessel disease in this case, but that is less likely. Electronically Signed   By: Paulina Fusi M.D.   On: 01/24/2019 15:06    EKG: Orders placed or performed during the hospital encounter of 01/24/19  . EKG 12-Lead  . EKG 12-Lead    IMPRESSION AND PLAN:  63 year old male patient with history of high blood pressure currently presented to emergency room for slow speech and confusion  -Acute CVA ischemic Admit  patient to medical floor  Neurology consult hypertension Check echocardiogram carotid ultrasound Start patient on aspirin and statin Neuro checks Speech therapy and physical therapy evaluation  -Hypertensive urgency Control blood pressure with oral lisinopril and hydralazine Medication compliance discussed with the patient  -DVT prophylaxis with subcu Lovenox daily  -Mildly elevated LFTs Follow-up LFTs  All the records are reviewed and case discussed with ED provider. Management plans discussed with the patient, family and they are in agreement.  CODE STATUS:  TOTAL TIME TAKING CARE OF THIS PATIENT: 53 minutes.    Ihor Austin M.D on 01/24/2019 at 3:53 PM  Between 7am to 6pm - Pager - [phone]  After 6pm go to [email or web address] - password [email or web address] [facility]  [facility]  Office  [phone]  CC: Primary care physician; McLean-Scocuzza, Pasty Spillers, MD

## 2019-01-25 ENCOUNTER — Inpatient Hospital Stay
Admit: 2019-01-25 | Discharge: 2019-01-25 | Disposition: A | Discharge status: Home / Self Care | Payer: BLUE CROSS/BLUE SHIELD | Attending: Internal Medicine | Admitting: Internal Medicine

## 2019-01-25 ENCOUNTER — Inpatient Hospital Stay: Payer: BLUE CROSS/BLUE SHIELD

## 2019-01-25 DIAGNOSIS — I639 Cerebral infarction, unspecified: Secondary | ICD-10-CM

## 2019-01-25 LAB — LIPID PANEL
Cholesterol: 145 mg/dL (ref 0–200)
HDL: 30 mg/dL — ABNORMAL LOW (ref >40)
LDL Cholesterol: 62 mg/dL (ref 0–99)
TRIGLYCERIDES: 266 mg/dL — AB (ref <150)
Total CHOL/HDL Ratio: 4.8 RATIO
VLDL: 53 mg/dL — ABNORMAL HIGH (ref 0–40)

## 2019-01-25 LAB — BASIC METABOLIC PANEL
ANION GAP: 3 — AB (ref 5–15)
BUN: 15 mg/dL (ref 8–23)
CO2: 29 mmol/L (ref 22–32)
Calcium: 8.5 mg/dL — ABNORMAL LOW (ref 8.9–10.3)
Chloride: 103 mmol/L (ref 98–111)
Creatinine, Ser: 1.17 mg/dL (ref 0.61–1.24)
GFR calc Af Amer: >60 mL/min (ref >60)
GFR calc non Af Amer: >60 mL/min (ref >60)
Glucose, Bld: 113 mg/dL — ABNORMAL HIGH (ref 70–99)
Potassium: 3.9 mmol/L (ref 3.5–5.1)
Sodium: 135 mmol/L (ref 135–145)

## 2019-01-25 LAB — HEMOGLOBIN A1C
HEMOGLOBIN A1C: 5.8 % — AB (ref 4.8–5.6)
Mean Plasma Glucose: 119.76 mg/dL

## 2019-01-25 LAB — ECHOCARDIOGRAM COMPLETE
Height: 67 in
Weight: 3255.75 oz

## 2019-01-25 MED ORDER — ATORVASTATIN CALCIUM 40 MG PO TABS: 40.0000 mg | ORAL_TABLET | Freq: Every day | ORAL | 0 refills | Status: DC

## 2019-01-25 MED ORDER — AMLODIPINE BESYLATE 5 MG PO TABS
5.0000 mg | ORAL_TABLET | Freq: Every day | ORAL | Status: DC
  Administered 2019-01-25: 14:00:00 5 mg via ORAL
  Filled 2019-01-25: qty 1

## 2019-01-25 MED ORDER — HYDRALAZINE HCL 25 MG PO TABS: 25.0000 mg | ORAL_TABLET | Freq: Three times a day (TID) | ORAL | 0 refills | Status: DC

## 2019-01-25 MED ORDER — AMLODIPINE BESYLATE 5 MG PO TABS: 5.0000 mg | ORAL_TABLET | Freq: Every day | ORAL | 0 refills | Status: DC

## 2019-01-25 MED ORDER — METOPROLOL TARTRATE 25 MG PO TABS
25.0000 mg | ORAL_TABLET | Freq: Two times a day (BID) | ORAL | Status: DC
  Administered 2019-01-25: 25 mg via ORAL
  Filled 2019-01-25: qty 1

## 2019-01-25 MED ORDER — CLOPIDOGREL BISULFATE 75 MG PO TABS
75.0000 mg | ORAL_TABLET | Freq: Every day | ORAL | Status: DC
  Administered 2019-01-25: 75 mg via ORAL
  Filled 2019-01-25: qty 1

## 2019-01-25 MED ORDER — IOHEXOL 350 MG/ML SOLN
75.0000 mL | Freq: Once | INTRAVENOUS | Status: AC | PRN
  Administered 2019-01-25: 11:00:00 75 mL via INTRAVENOUS

## 2019-01-25 MED ORDER — CLOPIDOGREL BISULFATE 75 MG PO TABS: 75.0000 mg | ORAL_TABLET | Freq: Every day | ORAL | 0 refills | Status: DC

## 2019-01-25 MED ORDER — SENNOSIDES-DOCUSATE SODIUM 8.6-50 MG PO TABS: 1.0000 | ORAL_TABLET | Freq: Every evening | ORAL | Status: DC | PRN

## 2019-01-25 MED ORDER — METOPROLOL TARTRATE 25 MG PO TABS: 25.0000 mg | ORAL_TABLET | Freq: Two times a day (BID) | ORAL | 0 refills | Status: DC

## 2019-01-25 NOTE — Progress Notes (Signed)
Pt's q 2hr neuro checks throughout the shift did not change from the 01/24/2019 2000 baseline that was previously documented in the flowsheets

## 2019-01-25 NOTE — Consult Note (Addendum)
Referring Physician: Ramonita LabGouru Aruna, MD    Chief Complaint:   HPI: Jesse Black is an 63 y.o. male with pertinent past medical history of uncontrolled hypertension presenting to the ED on 01/24/2019 with with altered mental status x 1 week.  Per patient's wife who provides collateral history today, patient has been demonstrating odd behavior since 01/18/2019.  Patient's wife report that he has been talking incoherently at times with rambling speech that does not seem to make sense.  He is also repeating the same conversation over and over not realizing that he has said the same thing.  At work his coworkers noticed that he appeared confused and at one point did not show up for work which was very unusual for him. Patient's wife states that he is not normally late for dinner but over the past week he has been chronically late to dinner several times which is also very abnormal for him.  They noticed that he has been unsteady and appears to be leaning towards the right when walking.  Patient's family tried to convince him to come to the ED however he refused stating there was nothing wrong with him.  Due to his worsening symptoms family decided to bring him to the ED for further evaluation. On arrival to the ED, he was afebrile with blood pressure 224/114 mm Hg and pulse rate 81 beats/min. There were no focal neurological deficits; he was alert and oriented x4, and he did not demonstrate any memory deficits. Complete blood count (CBC), thyroid-stimulating hormone (TSH) -reflex, urinalysis, drug of abuse screen were unremarkable other than comprehensive metabolic panel (CMP) which showed elevated creatinine 1.25, AST 62 and ALT 106; an insidious infectious workup was initiated, including rapid HIV were ultimately negative. ECG showed sinus rhythm of 82 beats per minute.  Initial NIH stroke scale 0. A non-contrast head CT showed small lacunar infarcts within the LEFT thalamus and LEFT external capsule. An MRI of  the head confirmed the thalamic infarctionat the junction of the cerebral peduncle, anterior thalamus and radiating white matter tracts. Patient was therefore admitted for further stroke work-up and management. On clinical examination, he appeared pleasant with rambling speech requiring redirection and repeating conversation but with no other focal neurological deficit.The patient has remained free from recurrent events and maintains a secondary prevention medication regimen including 81mg  of aspirin and 40mg  of atorvastatin.  Date last known well: Date: 01/18/2019 Time last known well: Unable to determine tPA Given: No: Outside window period  Past Medical History:  Diagnosis Date  . High blood pressure     Past Surgical History:  Procedure Laterality Date  . COLONOSCOPY WITH PROPOFOL N/A 10/05/2015   Procedure: COLONOSCOPY WITH PROPOFOL;  Surgeon: Scot Junobert T Elliott, MD;  [facility] ENDOSCOPY;  Service: Endoscopy;  Laterality: N/A;  . NO PAST SURGERIES      Family History  Problem Relation Age of Onset  . Lung cancer Father    Social History:  reports that he has never smoked. He has never used smokeless tobacco. He reports that he does not drink alcohol or use drugs.  Allergies: Not on File  Medications:  I have reviewed the patient's current medications. Prior to Admission:  Medications Prior to Admission  Medication Sig Dispense Refill Last Dose  . hydrochlorothiazide (HYDRODIURIL) 25 MG tablet TAKE 1 TABLET (25 MG TOTAL) BY MOUTH DAILY. *NEEDS 1 YEAR FOLLOW UP* (Patient taking differently: Take 25 mg by mouth daily. ) 90 tablet 3 Past Month at Unknown time  .  Multiple Vitamin (MULTI-VITAMIN DAILY) TABS Take 1 tablet by mouth daily.   Past Month at Unknown time  . omega-3 acid ethyl esters (LOVAZA) 1 g capsule Take 1 g by mouth daily.   Past Month at Unknown time   Scheduled: . aspirin  300 mg Rectal Daily   Or  . aspirin  325 mg Oral Daily  . atorvastatin  40 mg Oral  q1800  . enoxaparin (LOVENOX) injection  40 mg Subcutaneous Q24H  . hydrALAZINE  25 mg Oral Q8H  . lisinopril  20 mg Oral Daily  . multivitamin with minerals  1 tablet Oral Daily  . omega-3 acid ethyl esters  1 g Oral Daily    ROS: History obtained from the patient   General ROS: negative for - chills, fatigue, fever, night sweats, weight gain or weight loss Psychological ROS: negative for - behavioral disorder, hallucinations, memory difficulties, mood swings or suicidal ideation Ophthalmic ROS: negative for - blurry vision, double vision, eye pain or loss of vision ENT ROS: negative for - epistaxis, nasal discharge, oral lesions, sore throat, tinnitus or vertigo Allergy and Immunology ROS: negative for - hives or itchy/watery eyes Hematological and Lymphatic ROS: negative for - bleeding problems, bruising or swollen lymph nodes Endocrine ROS: negative for - galactorrhea, hair pattern changes, polydipsia/polyuria or temperature intolerance Respiratory ROS: negative for - cough, hemoptysis, shortness of breath or wheezing Cardiovascular ROS: negative for - chest pain, dyspnea on exertion, edema or irregular heartbeat Gastrointestinal ROS: negative for - abdominal pain, diarrhea, hematemesis, nausea/vomiting or stool incontinence Genito-Urinary ROS: negative for - dysuria, hematuria, incontinence or urinary frequency/urgency Musculoskeletal ROS: negative for - joint swelling or muscular weakness Neurological ROS: as noted in HPI Dermatological ROS: negative for rash and skin lesion changes  Physical Examination: Blood pressure (!) 176/98, pulse (!) 56, temperature 98.8 F (37.1 C), temperature source Oral, resp. rate 16, height 5\' 7"  (1.702 m), weight 92.3 kg, SpO2 94 %.   HEENT-  Normocephalic, no lesions, without obvious abnormality.  Normal external eye and conjunctiva.  Normal TM's bilaterally.  Normal auditory canals and external ears. Normal external nose, mucus membranes and  septum.  Normal pharynx. Cardiovascular- S1, S2 normal, pulses palpable throughout   Lungs- chest clear, no wheezing, rales, normal symmetric air entry Abdomen- soft, non-tender; bowel sounds normal; no masses,  no organomegaly Extremities- no edema Lymph-no adenopathy palpable Musculoskeletal-no joint tenderness, deformity or swelling Skin-warm and dry, no hyperpigmentation, vitiligo, or suspicious lesions  Neurological Exam   Mental Status: Alert, oriented x4, thought content appropriate.  Speech fluent without evidence of aphasia.  Able to follow 3 step commands without difficulty. Attention span and concentration seemed mildly inappropriate'; requires redirection. Repetition of words and or sentences. Cranial Nerves: II: Discs flat bilaterally; Visual fields grossly normal, pupils equal, round, reactive to light and accommodation III,IV, VI: ptosis not present, extra-ocular motions intact bilaterally V,VII: smile symmetric, facial light touch sensation intact VIII: hearing normal bilaterally IX,X: gag reflex present XI: bilateral shoulder shrug XII: midline tongue extension Motor: Right :  Upper extremity   5/5 Without pronator drift      Left: Upper extremity   5/5 without pronator drift Right:   Lower extremity   4+/5                                          Left: Lower extremity   5/5 Tone  and bulk:normal tone throughout; no atrophy noted Sensory: Pinprick and light touch intact bilaterally Deep Tendon Reflexes: 2+ and symmetric throughout Plantars: Right: mute                              Left: mute Cerebellar: Finger-to-nose testing intact bilaterally. Heel to shin testing normal bilaterally Gait: not tested due to safety concerns  Data Reviewed  Laboratory Studies:  Basic Metabolic Panel: Recent Labs  Lab 01/24/19 1239  NA 139  K 3.6  CL 103  CO2 30  GLUCOSE 117*  BUN 12  CREATININE 1.25*  CALCIUM 9.0    Liver Function Tests: Recent Labs  Lab  01/24/19 1239  AST 62*  ALT 106*  ALKPHOS 56  BILITOT 1.0  PROT 7.1  ALBUMIN 4.0   No results for input(s): LIPASE, AMYLASE in the last 168 hours. No results for input(s): AMMONIA in the last 168 hours.  CBC: Recent Labs  Lab 01/24/19 1239  WBC 9.0  HGB 15.5  HCT 43.1  MCV 94.7  PLT 224    Cardiac Enzymes: No results for input(s): CKTOTAL, CKMB, CKMBINDEX, TROPONINI in the last 168 hours.  BNP: Invalid input(s): POCBNP  CBG: Recent Labs  Lab 01/24/19 1159  GLUCAP 132*    Microbiology: No results found for this or any previous visit.  Coagulation Studies: No results for input(s): LABPROT, INR in the last 72 hours.  Urinalysis:  Recent Labs  Lab 01/24/19 1331  COLORURINE YELLOW*  LABSPEC 1.012  PHURINE 6.0  GLUCOSEU NEGATIVE  HGBUR NEGATIVE  BILIRUBINUR NEGATIVE  KETONESUR NEGATIVE  PROTEINUR NEGATIVE  NITRITE NEGATIVE  LEUKOCYTESUR NEGATIVE    Lipid Panel:    Component Value Date/Time   CHOL 145 01/25/2019 0642   TRIG 266 (H) 01/25/2019 0642   HDL 30 (L) 01/25/2019 0642   CHOLHDL 4.8 01/25/2019 0642   VLDL 53 (H) 01/25/2019 0642   LDLCALC 62 01/25/2019 0642    HgbA1C:  Lab Results  Component Value Date   HGBA1C 5.4 07/30/2015    Urine Drug Screen:      Component Value Date/Time   LABOPIA NONE DETECTED 01/24/2019 1331   COCAINSCRNUR NONE DETECTED 01/24/2019 1331   LABBENZ NONE DETECTED 01/24/2019 1331   AMPHETMU NONE DETECTED 01/24/2019 1331   THCU NONE DETECTED 01/24/2019 1331   LABBARB NONE DETECTED 01/24/2019 1331    Alcohol Level:  Recent Labs  Lab 01/24/19 1239  ETH <10    Other results: EKG: normal EKG, normal sinus rhythm, unchanged from previous tracings. Vent. rate 82 BPM PR interval 200 ms QRS duration 88 ms QT/QTc 400/467 ms P-R-T axes 39 33 20 Imaging: Ct Head Wo Contrast  Result Date: 01/24/2019 CLINICAL DATA:  Pt arrives with family with concerns over increased altered mental status since Friday. Pt  appropriate in triage; however family reports bizarre behavior. LKW last Friday. EXAM: CT HEAD WITHOUT CONTRAST TECHNIQUE: Contiguous axial images were obtained from the base of the skull through the vertex without intravenous contrast. COMPARISON:  None. FINDINGS: Brain: Small lacunar infarcts are identified within the LEFT thalamus and LEFT external capsule and favored to be remote or less likely, subacute. There are periventricular white matter changes consistent with small vessel disease. There is no intra or extra-axial fluid collection or mass lesion. The basilar cisterns and ventricles have a normal appearance. There is no CT evidence for acute infarction or hemorrhage. Vascular: No hyperdense vessel or unexpected calcification. Skull: Normal.  Negative for fracture or focal lesion. Sinuses/Orbits: No acute finding. Other: None. IMPRESSION: 1. Small lacunar infarcts within the LEFT thalamus and LEFT external capsule, favored to be remote or subacute. 2. Periventricular white matter changes consistent with small vessel disease. 3. No evidence for acute intracranial abnormality. Electronically Signed   By: Norva Pavlov M.D.   On: 01/24/2019 13:19   Mr Brain Wo Contrast  Result Date: 01/24/2019 CLINICAL DATA:  Behavioral disturbance. Hallucinating. Symptoms developing over the last week. EXAM: MRI HEAD WITHOUT CONTRAST TECHNIQUE: Multiplanar, multiecho pulse sequences of the brain and surrounding structures were obtained without intravenous contrast. COMPARISON:  Head CT same day FINDINGS: Brain: Diffusion imaging shows a 1.2 cm acute infarction at the junction of the left cerebral peduncle, anterior thalamus and radiating white matter tracts. No other acute infarction or other cause of restricted diffusion. There are multifocal white matter lesions affecting the cerebral hemispheric deep and subcortical white matter. One of the lesions in the left posterior frontal white matter shows fluid intensity  material centrally and shows some T2 shine through on diffusion imaging along the margins of that. There is an old small vessel infarction in the right thalamus. There is an old small vessel infarction in the left pons with hemosiderin deposition. Certainly, in this patient with hypertension, small vessel infarctions could explain this entire picture. However, one could at least consider the possibility, though unlikely, of demyelinating disease in this case. Vascular: Major vessels at the base of the brain show flow. Skull and upper cervical spine: Negative Sinuses/Orbits: Clear/normal Other: None IMPRESSION: 1.2 cm acute infarction at the base of the brain on the left at the junction of the cerebral peduncle, anterior thalamus and radiating white matter tracts. Extensive old small vessel type infarctions elsewhere throughout the brain as outlined above. One could consider the possibility of demyelinating disease coexisting with small-vessel disease in this case, but that is less likely. Electronically Signed   By: Paulina Fusi M.D.   On: 01/24/2019 15:06   US Carotid Bilateral (at [facility] And [facility] Only)  Result Date: 01/25/2019 CLINICAL DATA:  63 year old male with new onset left basal ganglia lacunar infarcts. EXAM: BILATERAL CAROTID DUPLEX ULTRASOUND TECHNIQUE: Wallace Cullens scale imaging, color Doppler and duplex ultrasound were performed of bilateral carotid and vertebral arteries in the neck. COMPARISON:  Brain MRI 01/24/2019 FINDINGS: Criteria: Quantification of carotid stenosis is based on velocity parameters that correlate the residual internal carotid diameter with NASCET-based stenosis levels, using the diameter of the distal internal carotid lumen as the denominator for stenosis measurement. The following velocity measurements were obtained: RIGHT ICA: 63/24 cm/sec CCA: 51/11 cm/sec SYSTOLIC ICA/CCA RATIO:  1.2 ECA:  67 cm/sec LEFT ICA: 59/21 cm/sec CCA: 70/17 cm/sec SYSTOLIC ICA/CCA RATIO:  0.8 ECA:  68 cm/sec  RIGHT CAROTID ARTERY: Trace heterogeneous atherosclerotic plaque in the proximal internal carotid artery. By peak systolic velocity criteria, the estimated stenosis remains less than 50%. RIGHT VERTEBRAL ARTERY:  Patent with normal antegrade flow. LEFT CAROTID ARTERY: No significant atherosclerotic plaque or evidence of stenosis in the internal carotid artery. LEFT VERTEBRAL ARTERY:  Patent with normal antegrade flow. IMPRESSION: 1. Mild (1-49%) stenosis proximal right internal carotid artery secondary to trace heterogeneous atherosclerotic plaque. 2. No significant atherosclerotic plaque or evidence of stenosis in the left internal carotid artery. 3. Vertebral arteries are patent with normal antegrade flow. Signed, Sterling Big, MD, RPVI [facility] Electronically Signed   By: Malachy Moan M.D.   On:  01/25/2019 09:13   Assessment: 63 y.o. male with pertinent past medical history of untreated hypertension presenting to the ED on 01/24/2019 with altered mental status and gait unsteadiness x 1 week. Noted to be demonstrating odd behaviors, incoherent and repeating conversations.  A non-contrast head CT showed small lacunar infarcts within the LEFT thalamus and LEFT external capsule. MRI of the head reviewed and shows thalamic infarction at the junction of the cerebral peduncle, anterior thalamus and radiating white matter tracts.  Ultrasound carotid negative for hemodynamically significant stenosis.  Etiology likely small vessel disease in the setting of elevated high blood pressure on presentation.  Patient report he has history of high blood pressure and has not been on any antihypertensive, anticoagulant or antiplatelet.   Stroke Risk Factors - hypertension  Plan: 1. HgbA1c, fasting lipid panel 2. CTA  of the head and neck with and without contrast 3. PT consult, OT consult, Speech consult 4. Echocardiogram 5. Start Aspirin 81 mg/day  with intensive management of vascular risk factor to keep systolic BP (SBP) <140 mm Hg (505 mm Hg if diabetic). 6. Lipid management with goal low density lipoprotein (LDL) <70 mg/dl 7. NPO until RN stroke swallow screen 8. Telemetry monitoring 9. Frequent neuro checks  This patient was staffed with Dr. Loretha Brasil, Doyle Askew who personally evaluated patient, reviewed documentation and agreed with assessment and plan of care as above.  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 - Needs dual anti platelet therapy of plavix ans ASA 01/25/2019, 9:43 AM

## 2019-01-25 NOTE — Progress Notes (Signed)
OT Cancellation Note  Patient Details Name: Jesse Black MRN: [id number] DOB: 08-03-1956   Cancelled Treatment:    Reason Eval/Treat Not Completed: Other (comment). Consult received, charted reviewed. Upon attempt, nurse tech exiting room reporting that pt is getting dressed in preparation for discharge. Unavailable for OT evaluation. Will re-attempt as appropriate.   Richrd Prime, MPH, MS, OTR/L ascom [phone] 01/25/19, 2:09 PM

## 2019-01-25 NOTE — Progress Notes (Signed)
Patient discharged home per MD order. Patient advised not to drive until seen by neurology follow up per Dr Amado Coe. Prescriptions given to patient. All discharge instructions given and all questions answered.

## 2019-01-25 NOTE — Discharge Summary (Signed)
[facility] - [place] at [facility]   PATIENT NAME: Jesse Black    MR#:  [id number]  DATE OF BIRTH:  03/26/1956  DATE OF ADMISSION:  01/24/2019 ADMITTING PHYSICIAN: Ihor Austin, MD  DATE OF DISCHARGE:  01/25/19   PRIMARY CARE PHYSICIAN: McLean-Scocuzza, Pasty Spillers, MD    ADMISSION DIAGNOSIS:  Cerebrovascular accident (CVA), unspecified mechanism (HCC) [I63.9]  DISCHARGE DIAGNOSIS:  Active Problems:   CVA (cerebral vascular accident) (HCC)  Uncontrolled blood pressure SECONDARY DIAGNOSIS:   Past Medical History:  Diagnosis Date  . High blood pressure     HOSPITAL COURSE:   HISTORY OF PRESENT ILLNESS: Jesse Black  is a 63 y.o. male with a known history of high blood pressure was initially brought to the emergency room for confusion.  [name] little confused and slow in speech.  Not been taking his blood pressure medication for the last couple of days.  According to patient's family he has slow speech for the last few days.  Blood pressure has been high today around like systolic 200 mmHg.  Patient was worked up for stroke.  MRI brain revealed 1.2 cm acute stroke in the thalamus area and basal cerebral peduncle.  Patient is awake alert and oriented to time place and person.  Speech appears to be better now in the emergency room.  He was given aspirin   -Acute CVA ischemic Echo cardiogram-55 to 60% ejection fraction Urine drug screen negative urinalysis negative MRI of the brain with left brain infarct Carotid Dopplers right ICA less than 49% stenosis in left ICA no stenosis CT angiogram of the head and neck with  mild right and moderate left M1 stenoses, and severe left A2 stenosis.. Strongly dominant left vertebral artery with moderate to severe V4 stenosis. Speech therapy and physical therapy evaluation-no needs identified Needs tight control of the blood pressure Discharge patient with aspirin 81 mg Plavix 75 mg and Lipitor  40 mg as recommended by neurology Outpatient follow-up with neurology Dr. Clelia Croft in 2 weeks Outpatient follow-up with interventional radiology Dr. Montine Circle in a week as recommended by neurology patient has to call and make an appointment Hemoglobin A1c 5.8   -Hypertensive urgency Control blood pressure with metoprolol, hydralazine and amlodipine titrate medications as needed Medication compliance discussed with the patient.  Patient is refusing to stay 1 more day he desperately wanted to go home.  Okay to discharge patient from neurology standpoint    -Mildly elevated LFTs-could be acute phase reactants Follow-up LFTs  DVT prophylaxis provided  DISCHARGE CONDITIONS:   Fair  CONSULTS OBTAINED:  Treatment Team:  Thana Farr, MD   PROCEDURES CT angiogram of the head and neck, MRI  DRUG ALLERGIES:  Not on File  DISCHARGE MEDICATIONS:   Allergies as of 01/25/2019   Not on File     Medication List    STOP taking these medications   hydrochlorothiazide 25 MG tablet Commonly known as:  HYDRODIURIL     TAKE these medications   amLODipine 5 MG tablet Commonly known as:  NORVASC Take 1 tablet (5 mg total) by mouth daily. Start taking on:  January 26, 2019   atorvastatin 40 MG tablet Commonly known as:  LIPITOR Take 1 tablet (40 mg total) by mouth daily at 6 PM.   clopidogrel 75 MG tablet Commonly known as:  PLAVIX Take 1 tablet (75 mg total) by mouth daily. Start taking on:  January 26, 2019   hydrALAZINE 25 MG tablet Commonly  known as:  APRESOLINE Take 1 tablet (25 mg total) by mouth every 8 (eight) hours.   metoprolol tartrate 25 MG tablet Commonly known as:  LOPRESSOR Take 1 tablet (25 mg total) by mouth 2 (two) times daily.   MULTI-VITAMIN DAILY Tabs Take 1 tablet by mouth daily.   omega-3 acid ethyl esters 1 g capsule Commonly known as:  LOVAZA Take 1 g by mouth daily.   senna-docusate 8.6-50 MG tablet Commonly known as:  Senokot-S Take 1  tablet by mouth at bedtime as needed for mild constipation.        DISCHARGE INSTRUCTIONS:   Follow-up with primary care physician in 3 days Follow-up with interventional radiology Dr. Montine Circleaveshwar in a week Instructed patient not to drive until seen and cleared by Dr. Montine Circleaveshwar -interventional radiology as recommended by neurology Follow-up with neurology Dr. Clelia CroftShaw in 2 weeks  DIET:  Cardiac diet  DISCHARGE CONDITION:  Fair  ACTIVITY:  Activity as tolerated  OXYGEN:  Home Oxygen: No.   Oxygen Delivery: room air  DISCHARGE LOCATION:  home   If you experience worsening of your admission symptoms, develop shortness of breath, life threatening emergency, suicidal or homicidal thoughts you must seek medical attention immediately by calling 911 or calling your MD immediately  if symptoms less severe.  You Must read complete instructions/literature along with all the possible adverse reactions/side effects for all the Medicines you take and that have been prescribed to you. Take any new Medicines after you have completely understood and accpet all the possible adverse reactions/side effects.   Please note  You were cared for by a hospitalist during your hospital stay. If you have any questions about your discharge medications or the care you received while you were in the hospital after you are discharged, you can call the unit and asked to speak with the hospitalist on call if the hospitalist that took care of you is not available. Once you are discharged, your primary care physician will handle any further medical issues. Please note that NO REFILLS for any discharge medications will be authorized once you are discharged, as it is imperative that you return to your primary care physician (or establish a relationship with a primary care physician if you do not have one) for your aftercare needs so that they can reassess your need for medications and monitor your lab values.     Today   Chief Complaint  Patient presents with  . Altered Mental Status   Patient is doing fine denies any headache or blurry vision.  Okay to discharge patient from neurology standpoint.  Blood pressure is uncontrolled but patient is refusing to stay 1 more day  ROS:  CONSTITUTIONAL: Denies fevers, chills. Denies any fatigue, weakness.  EYES: Denies blurry vision, double vision, eye pain. EARS, NOSE, THROAT: Denies tinnitus, ear pain, hearing loss. RESPIRATORY: Denies cough, wheeze, shortness of breath.  CARDIOVASCULAR: Denies chest pain, palpitations, edema.  GASTROINTESTINAL: Denies nausea, vomiting, diarrhea, abdominal pain. Denies bright red blood per rectum. GENITOURINARY: Denies dysuria, hematuria. ENDOCRINE: Denies nocturia or thyroid problems. HEMATOLOGIC AND LYMPHATIC: Denies easy bruising or bleeding. SKIN: Denies rash or lesion. MUSCULOSKELETAL: Denies pain in neck, back, shoulder, knees, hips or arthritic symptoms.  NEUROLOGIC: Denies paralysis, paresthesias.  PSYCHIATRIC: Denies anxiety or depressive symptoms.   VITAL SIGNS:  Blood pressure (!) 161/90, pulse 63, temperature 98 F (36.7 C), temperature source Oral, resp. rate 20, height 5\' 7"  (1.702 m), weight 92.3 kg, SpO2 100 %.  I/O:  Intake/Output Summary (Last 24 hours) at 01/25/2019 1609 Last data filed at 01/25/2019 0900 Gross per 24 hour  Intake 240 ml  Output 800 ml  Net -560 ml    PHYSICAL EXAMINATION:  GENERAL:  63 y.o.-year-old patient lying in the bed with no acute distress.  EYES: Pupils equal, round, reactive to light and accommodation. No scleral icterus. Extraocular muscles intact.  HEENT: Head atraumatic, normocephalic. Oropharynx and nasopharynx clear.  NECK:  Supple, no jugular venous distention. No thyroid enlargement, no tenderness.  LUNGS: Normal breath sounds bilaterally, no wheezing, rales,rhonchi or crepitation. No use of accessory muscles of respiration.  CARDIOVASCULAR: S1, S2 normal. No  murmurs, rubs, or gallops.  ABDOMEN: Soft, non-tender, non-distended. Bowel sounds present. No organomegaly or mass.  EXTREMITIES: No pedal edema, cyanosis, or clubbing.  NEUROLOGIC: Cranial nerves II through XII are intact. Muscle strength 5/5 in all extremities. Sensation intact. Gait not checked.  PSYCHIATRIC: The patient is alert and oriented x 3.  SKIN: No obvious rash, lesion, or ulcer.   DATA REVIEW:   CBC Recent Labs  Lab 01/24/19 1239  WBC 9.0  HGB 15.5  HCT 43.1  PLT 224    Chemistries  Recent Labs  Lab 01/24/19 1239 01/25/19 0642  NA 139 135  K 3.6 3.9  CL 103 103  CO2 30 29  GLUCOSE 117* 113*  BUN 12 15  CREATININE 1.25* 1.17  CALCIUM 9.0 8.5*  AST 62*  --   ALT 106*  --   ALKPHOS 56  --   BILITOT 1.0  --     Cardiac Enzymes No results for input(s): TROPONINI in the last 168 hours.  Microbiology Results  No results found for this or any previous visit.  RADIOLOGY:  Ct Angio Head W Or Wo Contrast  Result Date: 01/25/2019 CLINICAL DATA:  Stroke follow-up. Acute left thalamic infarct on MRI. EXAM: CT ANGIOGRAPHY HEAD AND NECK TECHNIQUE: Multidetector CT imaging of the head and neck was performed using the standard protocol during bolus administration of intravenous contrast. Multiplanar CT image reconstructions and MIPs were obtained to evaluate the vascular anatomy. Carotid stenosis measurements (when applicable) are obtained utilizing NASCET criteria, using the distal internal carotid diameter as the denominator. CONTRAST:  24mL OMNIPAQUE IOHEXOL 350 MG/ML SOLN COMPARISON:  Brain MRI 01/24/2019. Carotid Doppler ultrasound 01/25/2019. FINDINGS: CT HEAD FINDINGS Brain: An acute lacunar infarct is again noted near the ventral margin of the left thalamus. Patchy cerebral white matter hypodensities are nonspecific but compatible with moderately advanced chronic small vessel ischemic disease. Chronic lacunar infarcts are noted in the corona radiata bilaterally. No  acute intracranial hemorrhage, mass, midline shift, or extra-axial fluid collection is identified. Vascular: As below. Skull: No fracture or focal osseous lesion. Sinuses: Paranasal sinuses and mastoid air cells are clear. Orbits: Unremarkable. Review of the MIP images confirms the above findings CTA NECK FINDINGS Aortic arch: Normal variant aortic arch branching pattern with common origin of the brachiocephalic and left common carotid arteries. Widely patent arch vessel origins. Right carotid system: Patent without evidence of stenosis or dissection. Tortuous proximal ICA. Left carotid system: Patent without evidence of stenosis or dissection. Vertebral arteries: Patent and strongly dominant left vertebral artery with mild atherosclerotic plaque at its origin not resulting in significant stenosis. Extremely diminutive but grossly patent right vertebral artery with assessment limited proximally by venous contrast. Skeleton: Advanced disc degeneration at C5-6 and C6-7 with asymmetric right neural foraminal stenosis at both levels due to uncovertebral spurring. Other neck: No evidence of  acute abnormality or mass. Upper chest: Clear lung apices. Review of the MIP images confirms the above findings CTA HEAD FINDINGS Anterior circulation: The internal carotid arteries are widely patent from skull base to carotid termini. ACAs and MCAs are patent without evidence of proximal branch occlusion. The right A1 segment is hypoplastic. There are bilateral distal M1 stenoses, mild on the right and moderate on the left. There is a severe mid left A2 stenosis, and there is ACA and MCA distal branch vessel irregularity bilaterally. No aneurysm is identified. Posterior circulation: Focal calcified plaque in the left V4 segment results in moderate to severe stenosis. The right V4 segment is diminutive with a possible severe stenosis versus short segment occlusion in its midportion though assessment is limited by the small size of the  vessel and beam hardening at the skull base. Patent left PICA and bilateral SCA origins are identified. The basilar artery is patent without evidence of significant stenosis. There is a fetal origin of the left PCA. There are moderate right and severe left P2 stenoses with marked irregular narrowing and attenuation of more distal PCA branch vessels bilaterally. No aneurysm is identified. Venous sinuses: Patent. Anatomic variants: Fetal left PCA.  Hypoplastic right A1. Delayed phase: No abnormal enhancement. Review of the MIP images confirms the above findings IMPRESSION: 1. Intracranial atherosclerosis including moderate right and severe left P2 stenoses, mild right and moderate left M1 stenoses, and severe left A2 stenosis. 2. Strongly dominant left vertebral artery with moderate to severe V4 stenosis. 3. Extremely diminutive right vertebral artery. 4. Widely patent carotid arteries. Electronically Signed   By: Sebastian Ache M.D.   On: 01/25/2019 14:09   Ct Head Wo Contrast  Result Date: 01/24/2019 CLINICAL DATA:  Pt arrives with family with concerns over increased altered mental status since Friday. Pt appropriate in triage; however family reports bizarre behavior. LKW last Friday. EXAM: CT HEAD WITHOUT CONTRAST TECHNIQUE: Contiguous axial images were obtained from the base of the skull through the vertex without intravenous contrast. COMPARISON:  None. FINDINGS: Brain: Small lacunar infarcts are identified within the LEFT thalamus and LEFT external capsule and favored to be remote or less likely, subacute. There are periventricular white matter changes consistent with small vessel disease. There is no intra or extra-axial fluid collection or mass lesion. The basilar cisterns and ventricles have a normal appearance. There is no CT evidence for acute infarction or hemorrhage. Vascular: No hyperdense vessel or unexpected calcification. Skull: Normal. Negative for fracture or focal lesion. Sinuses/Orbits: No acute  finding. Other: None. IMPRESSION: 1. Small lacunar infarcts within the LEFT thalamus and LEFT external capsule, favored to be remote or subacute. 2. Periventricular white matter changes consistent with small vessel disease. 3. No evidence for acute intracranial abnormality. Electronically Signed   By: Norva Pavlov M.D.   On: 01/24/2019 13:19   Ct Angio Neck W Or Wo Contrast  Result Date: 01/25/2019 CLINICAL DATA:  Stroke follow-up. Acute left thalamic infarct on MRI. EXAM: CT ANGIOGRAPHY HEAD AND NECK TECHNIQUE: Multidetector CT imaging of the head and neck was performed using the standard protocol during bolus administration of intravenous contrast. Multiplanar CT image reconstructions and MIPs were obtained to evaluate the vascular anatomy. Carotid stenosis measurements (when applicable) are obtained utilizing NASCET criteria, using the distal internal carotid diameter as the denominator. CONTRAST:  75mL OMNIPAQUE IOHEXOL 350 MG/ML SOLN COMPARISON:  Brain MRI 01/24/2019. Carotid Doppler ultrasound 01/25/2019. FINDINGS: CT HEAD FINDINGS Brain: An acute lacunar infarct is again noted near the  ventral margin of the left thalamus. Patchy cerebral white matter hypodensities are nonspecific but compatible with moderately advanced chronic small vessel ischemic disease. Chronic lacunar infarcts are noted in the corona radiata bilaterally. No acute intracranial hemorrhage, mass, midline shift, or extra-axial fluid collection is identified. Vascular: As below. Skull: No fracture or focal osseous lesion. Sinuses: Paranasal sinuses and mastoid air cells are clear. Orbits: Unremarkable. Review of the MIP images confirms the above findings CTA NECK FINDINGS Aortic arch: Normal variant aortic arch branching pattern with common origin of the brachiocephalic and left common carotid arteries. Widely patent arch vessel origins. Right carotid system: Patent without evidence of stenosis or dissection. Tortuous proximal ICA.  Left carotid system: Patent without evidence of stenosis or dissection. Vertebral arteries: Patent and strongly dominant left vertebral artery with mild atherosclerotic plaque at its origin not resulting in significant stenosis. Extremely diminutive but grossly patent right vertebral artery with assessment limited proximally by venous contrast. Skeleton: Advanced disc degeneration at C5-6 and C6-7 with asymmetric right neural foraminal stenosis at both levels due to uncovertebral spurring. Other neck: No evidence of acute abnormality or mass. Upper chest: Clear lung apices. Review of the MIP images confirms the above findings CTA HEAD FINDINGS Anterior circulation: The internal carotid arteries are widely patent from skull base to carotid termini. ACAs and MCAs are patent without evidence of proximal branch occlusion. The right A1 segment is hypoplastic. There are bilateral distal M1 stenoses, mild on the right and moderate on the left. There is a severe mid left A2 stenosis, and there is ACA and MCA distal branch vessel irregularity bilaterally. No aneurysm is identified. Posterior circulation: Focal calcified plaque in the left V4 segment results in moderate to severe stenosis. The right V4 segment is diminutive with a possible severe stenosis versus short segment occlusion in its midportion though assessment is limited by the small size of the vessel and beam hardening at the skull base. Patent left PICA and bilateral SCA origins are identified. The basilar artery is patent without evidence of significant stenosis. There is a fetal origin of the left PCA. There are moderate right and severe left P2 stenoses with marked irregular narrowing and attenuation of more distal PCA branch vessels bilaterally. No aneurysm is identified. Venous sinuses: Patent. Anatomic variants: Fetal left PCA.  Hypoplastic right A1. Delayed phase: No abnormal enhancement. Review of the MIP images confirms the above findings IMPRESSION: 1.  Intracranial atherosclerosis including moderate right and severe left P2 stenoses, mild right and moderate left M1 stenoses, and severe left A2 stenosis. 2. Strongly dominant left vertebral artery with moderate to severe V4 stenosis. 3. Extremely diminutive right vertebral artery. 4. Widely patent carotid arteries. Electronically Signed   By: Sebastian Ache M.D.   On: 01/25/2019 14:09   Mr Brain Wo Contrast  Result Date: 01/24/2019 CLINICAL DATA:  Behavioral disturbance. Hallucinating. Symptoms developing over the last week. EXAM: MRI HEAD WITHOUT CONTRAST TECHNIQUE: Multiplanar, multiecho pulse sequences of the brain and surrounding structures were obtained without intravenous contrast. COMPARISON:  Head CT same day FINDINGS: Brain: Diffusion imaging shows a 1.2 cm acute infarction at the junction of the left cerebral peduncle, anterior thalamus and radiating white matter tracts. No other acute infarction or other cause of restricted diffusion. There are multifocal white matter lesions affecting the cerebral hemispheric deep and subcortical white matter. One of the lesions in the left posterior frontal white matter shows fluid intensity material centrally and shows some T2 shine through on diffusion imaging along the  margins of that. There is an old small vessel infarction in the right thalamus. There is an old small vessel infarction in the left pons with hemosiderin deposition. Certainly, in this patient with hypertension, small vessel infarctions could explain this entire picture. However, one could at least consider the possibility, though unlikely, of demyelinating disease in this case. Vascular: Major vessels at the base of the brain show flow. Skull and upper cervical spine: Negative Sinuses/Orbits: Clear/normal Other: None IMPRESSION: 1.2 cm acute infarction at the base of the brain on the left at the junction of the cerebral peduncle, anterior thalamus and radiating white matter tracts. Extensive old  small vessel type infarctions elsewhere throughout the brain as outlined above. One could consider the possibility of demyelinating disease coexisting with small-vessel disease in this case, but that is less likely. Electronically Signed   By: Paulina Fusi M.D.   On: 01/24/2019 15:06   US Carotid Bilateral (at [facility] Only)  Result Date: 01/25/2019 CLINICAL DATA:  63 year old male with new onset left basal ganglia lacunar infarcts. EXAM: BILATERAL CAROTID DUPLEX ULTRASOUND TECHNIQUE: Wallace Cullens scale imaging, color Doppler and duplex ultrasound were performed of bilateral carotid and vertebral arteries in the neck. COMPARISON:  Brain MRI 01/24/2019 FINDINGS: Criteria: Quantification of carotid stenosis is based on velocity parameters that correlate the residual internal carotid diameter with NASCET-based stenosis levels, using the diameter of the distal internal carotid lumen as the denominator for stenosis measurement. The following velocity measurements were obtained: RIGHT ICA: 63/24 cm/sec CCA: 51/11 cm/sec SYSTOLIC ICA/CCA RATIO:  1.2 ECA:  67 cm/sec LEFT ICA: 59/21 cm/sec CCA: 70/17 cm/sec SYSTOLIC ICA/CCA RATIO:  0.8 ECA:  68 cm/sec RIGHT CAROTID ARTERY: Trace heterogeneous atherosclerotic plaque in the proximal internal carotid artery. By peak systolic velocity criteria, the estimated stenosis remains less than 50%. RIGHT VERTEBRAL ARTERY:  Patent with normal antegrade flow. LEFT CAROTID ARTERY: No significant atherosclerotic plaque or evidence of stenosis in the internal carotid artery. LEFT VERTEBRAL ARTERY:  Patent with normal antegrade flow. IMPRESSION: 1. Mild (1-49%) stenosis proximal right internal carotid artery secondary to trace heterogeneous atherosclerotic plaque. 2. No significant atherosclerotic plaque or evidence of stenosis in the left internal carotid artery. 3. Vertebral arteries are patent with normal antegrade flow. Signed, Sterling Big, MD, RPVI [facility] Electronically Signed   By: Malachy Moan M.D.   On: 01/25/2019 09:13    EKG:   Orders placed or performed during the hospital encounter of 01/24/19  . EKG 12-Lead  . EKG 12-Lead      Management plans discussed with the patient, family and they are in agreement.  CODE STATUS:     Code Status Orders  (From admission, onward)         Start     Ordered   01/24/19 1705  Full code  Continuous     01/24/19 1704        Code Status History    This patient has a current code status but no historical code status.      TOTAL TIME TAKING CARE OF THIS PATIENT: 45  minutes.   Note: This dictation was prepared with Dragon dictation along with smaller phrase technology. Any transcriptional errors that result from this process are unintentional.   @[facility] @  on 01/25/2019 at 4:09 PM  Between 7am to 6pm - Pager - [phone]  After 6pm go to [email or web address] - Scientist, research (life sciences) Hospitalists  Office  [phone]  CC: Primary care physician; McLean-Scocuzza, Nino Glow, MD

## 2019-01-25 NOTE — Progress Notes (Signed)
*  PRELIMINARY RESULTS* Echocardiogram 2D Echocardiogram has been performed.  Jesse Black Luisalberto Beegle 01/25/2019, 9:57 AM

## 2019-01-25 NOTE — Evaluation (Signed)
Occupational Therapy Evaluation Patient Details Name: Jesse Black MRN: [id number] DOB: 14-Oct-1956 Today's Date: 01/25/2019    History of Present Illness Pt is a 63 y.o. male presenting to hospital 01/24/19 with AMS x1 week.  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  Pt admitted with acute CVA and hypertensive urgency.  PMH includes htn.   Clinical Impression   Pt seen for OT evaluation this date. 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 Pt lives with his spouse.  Currently pt able to perform functional mobility and simple ADL tasks at baseline independence. Pt/wife report no difficulties with pt manipulating and using his cell phone which pt demonstrates to OT. No FMC, sensory, focal strength, or balance deficits appreciated. Difficult to further assess higher level cognitive tasks that pt would need to perform as part of his normal job functions and for other higher level tasks such as driving. Pt has limited insight into deficits, but trusts that family is providing truthful information. Pt very jovial and jokes around, which family states is baseline for the pt. Question whether behavior is also used to deflect. Encouraged pt/family to seek out additional OP OT services should they find that pt has difficulty with return to driving and work.     Follow Up Recommendations  Other (comment)(Rec OP OT should pt/family have higher level cognitive concerns as pt transitions back to driving and back to work)    Equipment Recommendations  None recommended by OT    Recommendations for Other Services       Precautions / Restrictions Precautions Precautions: Fall Restrictions Weight Bearing Restrictions: No      Mobility Bed Mobility Overal bed mobility: Independent                 Transfers Overall transfer level: Independent Equipment used: None                 Balance Overall balance assessment: No apparent balance deficits (not formally assessed) Sitting-balance support: No upper extremity supported;Feet supported Sitting balance-Leahy Scale: Normal Sitting balance - Comments: steady sitting putting on his shoes   Standing balance support: No upper extremity supported;During functional activity Standing balance-Leahy Scale: Good                          ADL either performed or assessed with clinical judgement   ADL Overall ADL's : Independent                                       General ADL Comments: indep with basic self care tasks     Vision Patient Visual Report: No change from baseline       Perception     Praxis      Pertinent Vitals/Pain Pain Assessment: No/denies pain     Hand Dominance     Extremity/Trunk Assessment Upper Extremity Assessment Upper Extremity Assessment: Overall WFL for tasks assessed   Lower Extremity Assessment Lower Extremity Assessment: Overall WFL for tasks assessed   Cervical / Trunk Assessment Cervical / Trunk Assessment: Normal   Communication Communication Communication: No difficulties   Cognition Arousal/Alertness: Awake/alert Behavior During Therapy: WFL for tasks assessed/performed Overall Cognitive Status: (Oriented to person, place, time, and able  to state situation (although pt reports it is what people have told him--pt reports not noticing it himself))                                 General Comments: Decreased awareness of deficits noted but pt reports he trusts that what his family is saying is true. Jokes around a lot.    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[employer], CAD Estate agent, email)        OT Problem List: Decreased safety awareness;Decreased cognition      OT Treatment/Interventions:      OT Goals(Current goals can be found in the care plan section) Acute Rehab OT Goals Patient Stated Goal: to go home and back to work OT Goal Formulation: All assessment and education complete, DC therapy  OT Frequency:     Barriers to D/C:            Co-evaluation              AM-PAC OT "6 Clicks" Daily Activity     Outcome Measure Help from another person eating meals?: None Help from another person taking care of personal grooming?: None Help from another person toileting, which includes using toliet, bedpan, or urinal?: None Help from another person bathing (including washing, rinsing, drying)?: None Help from another person to put on and taking off regular upper body clothing?: None Help from another person to put on and taking off regular lower body clothing?: None 6 Click Score: 24   End of Session    Activity Tolerance: Patient tolerated treatment well Patient left: in chair;with call bell/phone within reach;with family/visitor present;Other (comment)(family and MD in room)  OT Visit Diagnosis: Other symptoms and signs involving cognitive function                Time: 5638-9373 OT Time Calculation (min): 16 min Charges:  OT General Charges $OT Visit: 1 Visit OT Evaluation $OT Eval Low Complexity: 1 Low  Richrd Prime, MPH, MS, OTR/L ascom [phone] 01/25/19, 2:42 PM

## 2019-01-25 NOTE — Progress Notes (Signed)
Pt declined information/education about AD. He said he just wanted to sleep. His only need was extra blanket, which was provided   01/25/19 0600  Clinical Encounter Type  Visited With Patient;Family  Visit Type Initial;Spiritual support  Referral From Nurse  Spiritual Encounters  Spiritual Needs Emotional;Grief support  Stress Factors  Patient Stress Factors Exhausted  Family Stress Factors Loss  Advance Directives (For Healthcare)  Does Patient Have a Medical Advance Directive? No  Would patient like information on creating a medical advance directive? No - Patient declined  Mental Health Advance Directives  Does Patient Have a Mental Health Advance Directive? No  Would patient like information on creating a mental health advance directive? No - Patient declined   to him.

## 2019-01-25 NOTE — Plan of Care (Signed)
  Problem: Education: Goal: Knowledge of disease or condition will improve Outcome: Progressing Goal: Knowledge of secondary prevention will improve Outcome: Progressing Goal: Knowledge of patient specific risk factors addressed and post discharge goals established will improve Outcome: Progressing   Problem: Coping: Goal: Will verbalize positive feelings about self Outcome: Progressing Goal: Will identify appropriate support needs Outcome: Progressing   Problem: Ischemic Stroke/TIA Tissue Perfusion: Goal: Complications of ischemic stroke/TIA will be minimized Outcome: Progressing   

## 2019-01-25 NOTE — Progress Notes (Signed)
SLP Cancellation Note  Patient Details Name: Adrienne Winders MRN: [id number] DOB: 1956-11-17   Cancelled treatment:       Reason Eval/Treat Not Completed: Patient at procedure or test/unavailable    SLP will try again later today.  Dollene Primrose, MS/CCC- SLP  Leandrew Koyanagi 01/25/2019, 11:17 AM

## 2019-01-25 NOTE — Evaluation (Signed)
Speech Language Pathology Evaluation Patient Details Name: Jesse Black MRN: [id number] DOB: [birth year] Today's Date: 01/25/2019 Time: 5790-3833 SLP Time Calculation (min) (ACUTE ONLY): 59 min  Problem List:  Patient Active Problem List   Diagnosis Date Noted  . CVA (cerebral vascular accident) (HCC) 01/24/2019  . Preventative health care 07/31/2015  . Hypertension 07/31/2015  . Hypertensive urgency 07/30/2015   Past Medical History:  Past Medical History:  Diagnosis Date  . High blood pressure    Past Surgical History:  Past Surgical History:  Procedure Laterality Date  . COLONOSCOPY WITH PROPOFOL N/A 10/05/2015   Procedure: COLONOSCOPY WITH PROPOFOL;  Surgeon: Scot Jun, MD;  Location: [facility] ENDOSCOPY;  Service: Endoscopy;  Laterality: N/A;  . NO PAST SURGERIES     HPI:  HPI: Jesse Black is an 63 y.o. male with pertinent past medical history of uncontrolled hypertension presenting to the ED on 01/24/2019 with with altered mental status x 1 week.  Per patient's wife who provides collateral history today, patient has been demonstrating odd behavior since 01/18/2019.  Patient's wife report that he has been talking incoherently at times with rambling speech that does not seem to make sense.  He is also repeating the same conversation over and over not realizing that he has said the same thing.  At work his coworkers noticed that he appeared confused and at one point did not show up for work which was very unusual for him. Patient's wife states that he is not normally late for dinner but over the past week he has been chronically late to dinner several times which is also very abnormal for him.  They noticed that he has been unsteady and appears to be leaning towards the right when walking.  Patient's family tried to convince him to come to the ED however he refused stating there was nothing wrong with him.  Due to his worsening symptoms family decided to bring him to the ED  for further evaluation. On arrival to the ED, he was afebrile with blood pressure 224/114 mm Hg and pulse rate 81 beats/min. There were no focal neurological deficits; he was alert and oriented x4, and he did not demonstrate any memory deficits. Complete blood count (CBC), thyroid-stimulating hormone (TSH) -reflex, urinalysis, drug of abuse screen were unremarkable other than comprehensive metabolic panel (CMP) which showed elevated creatinine 1.25, AST 62 and ALT 106; an insidious infectious workup was initiated, including rapid HIV were ultimately negative. ECG showed sinus rhythm of 82 beats per minute.  Initial NIH stroke scale 0. A non-contrast head CT showed small lacunar infarcts within the LEFT thalamus and LEFT external capsule. An MRI of the head confirmed the thalamic infarction at the junction of the cerebral peduncle, anterior thalamus and radiating white matter tracts. Patient was therefore admitted for further stroke work-up and management. On clinical examination, he appeared pleasant with rambling speech requiring redirection and repeating conversation but with no other focal neurological deficit. The patient has remained free from recurrent events and maintains a secondary prevention medication regimen including 81mg  of aspirin and 40mg  of atorvastatin.   Assessment / Plan / Recommendation Clinical Impression  The patient is presenting with mild cognitive communication impairment characterized by voluble speech, delayed recall impairment, impaired attention/vigilance, impairment of executive skills, and reduced awareness of deficits.  The patient scored 18/20 on the MOCA.  Recommend that the patient have outpatient speech therapy for restorative and compensatory treatment of cognitive communication deficits.  He may benefit from referral to  neuropsychology for more in-depth evaluation of cognitive function.     Montreal Cognitive Assessment [facility]) Version:  8.1 Visuospatieal/Executive Alternating trail making       0/1 Visuoconstruction Skills (copy 3-d design) 0/1 Draw a clock     1/3 Naming     3/3 Attention Forward digit span    1/1 Backward digit span    1/1 Vigilance     0/1 Serial 7's     2/3 Language  Verbal Fluency     0/1 Repetition     2/2 Abstraction     2/2 Delayed Recall    0/5  Memory Index Score 7/15 Orientation     6/6 TOTAL      18/30       Normal  ? 26/30       SLP Assessment  SLP Recommendation/Assessment: All further Speech Lanaguage Pathology  needs can be addressed in the next venue of care SLP Visit Diagnosis: Cognitive communication deficit (R41.841)    Follow Up Recommendations       Frequency and Duration min 1 x/week         SLP Evaluation Cognition  Overall Cognitive Status: Impaired/Different from baseline Orientation Level: Oriented X4       Comprehension       Expression     Oral / Motor  Motor Speech Overall Motor Speech: Appears within functional limits for tasks assessed   GO                   Dollene Primrose, MS/CCC- SLP  Tida Saner, Susie 01/25/2019, 3:44 PM

## 2019-01-25 NOTE — Evaluation (Signed)
Physical Therapy Evaluation Patient Details Name: Jesse Black MRN: [id number] DOB: 29-Feb-1956 Today's Date: 01/25/2019   History of Present Illness  Pt is a 63 y.o. male presenting to hospital 01/24/19 with AMS x1 week.  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  Pt admitted with acute CVA and hypertensive urgency.  PMH includes htn.  Clinical Impression  Prior to hospital admission, pt was independent and working.  Pt lives with his wife in 1 level home with 5 steps and R railing to enter.  Initially when PT entered room, pt not on phone but when therapist started talking to pt, pt started to text on his phone (pt reporting needing to text his boss so therapist stopped to let pt perform task); then when pt was done, therapist continued to ask pt questions and then pt picked up advertisement sheet (from newspaper) and started to read it.  Oriented to person, place, and time.  Pt friendly and tended to joke around a lot.  Pt able to state situation but it was information that he was told (pt reporting he had not noticed any of the concerns).  In terms of functional mobility, currently pt is independent with bed mobility, independent with transfers, CGA with ambulation 240 feet no AD, and CGA navigating 6 steps with R railing.  Very mild R LE gait impairments noted with increased distance ambulating (see below for details) but pt appearing steady and safe (no loss of balance).  Of note, when attempting to initially step over obstacle on floor pt caught obstacle with R foot, and when navigating steps pt caught step with tip of R shoe mildly with ascending 3/6 steps (pt only aware of catching tip of shoe 1/3 times); pt able to self correct or maintain balance with all activities though.  Pt appearing with decreased awareness and some impulsiveness with activities at times (OT and SLP notified).  Pt  would benefit from skilled PT to address noted impairments and functional limitations during hospital stay (see below for any additional details).  Upon hospital discharge, anticipate no PT follow-up needs but educated pt and pt's family to follow-up with PCP for any OP PT needs if mobility or balance concerns were noted.  Pt and family also educated to provide extra supervision initially with higher level mobility d/t above noted awareness concerns.    Follow Up Recommendations Other (comment)(Follow-up with PCP for any OP PT needs)    Equipment Recommendations  None recommended by PT    Recommendations for Other Services       Precautions / Restrictions Precautions Precautions: Fall Restrictions Weight Bearing Restrictions: No      Mobility  Bed Mobility Overal bed mobility: Independent                Transfers Overall transfer level: Independent Equipment used: None             General transfer comment: pt quick to stand but steady with multiple trials  Ambulation/Gait Ambulation/Gait assistance: Min guard Gait Distance (Feet): 240 Feet Assistive device: None Gait Pattern/deviations: Step-through pattern   Gait velocity interpretation: >2.62 ft/sec, indicative of community ambulatory General Gait Details: with increased distance very mild increased R LE external rotation (with very mild R LE movement laterally during swing phase) and very mild decreased R LE stance time noted; steady  Stairs Stairs: Yes Stairs assistance: Min guard Stair Management: One rail  Right;Alternating pattern;Forwards Number of Stairs: 6 General stair comments: 3/6 steps pt caught R tip of shoe mildly on step ascending (pt only noticed 1/3 times he did this) but pt able to self correct or maintain balance  Wheelchair Mobility    Modified Rankin (Stroke Patients Only)       Balance Overall balance assessment: Needs assistance Sitting-balance support: No upper extremity  supported;Feet supported Sitting balance-Leahy Scale: Normal Sitting balance - Comments: steady sitting putting on his shoes   Standing balance support: No upper extremity supported;During functional activity Standing balance-Leahy Scale: Good Standing balance comment: no loss of balance with ambulation                 Standardized Balance Assessment Standardized Balance Assessment : Dynamic Gait Index   Dynamic Gait Index Level Surface: Mild Impairment(very mild gait deviation with increased distance ambulating) Change in Gait Speed: Normal Gait with Horizontal Head Turns: Normal Gait with Vertical Head Turns: Normal Gait and Pivot Turn: Normal Step Over Obstacle: Moderate Impairment(pt initially caught obstacle with R foot when attempting to step over--no loss of balance; required vc's and demo for technique to step over obstacle) Step Around Obstacles: Normal Steps: Mild Impairment Total Score: 20       Pertinent Vitals/Pain Pain Assessment: No/denies pain    Home Living Family/patient expects to be discharged to:: Private residence Living Arrangements: Spouse/significant other Available Help at Discharge: Family(Wife retired) Type of Home: House Home Access: Stairs to enter Entrance Stairs-Rails: Right Entrance Stairs-Number of Steps: 5 Home Layout: One level Home Equipment: None      Prior Function Level of Independence: Independent         Comments: Working (desk job).     Hand Dominance        Extremity/Trunk Assessment   Upper Extremity Assessment Upper Extremity Assessment: Defer to OT evaluation;[facility] for tasks assessed    Lower Extremity Assessment Lower Extremity Assessment: (Intact B LE strength, sensation, coordination, proprioception, and tone.  5/5 B hip flexion, knee flexion/extension, and DF)    Cervical / Trunk Assessment Cervical / Trunk Assessment: Normal  Communication   Communication: No difficulties  Cognition  Arousal/Alertness: Awake/alert Behavior During Therapy: (Tends to joke around a lot (baseline per pt's family); appearing impulsive at times with behavior and mobility) Overall Cognitive Status: (Oriented to person, place, time, and able to state situation (although pt reports it is what people have told him--pt reports not noticing it himself))                                 General Comments: Decreased awareness of deficits noted      General Comments   Nursing cleared pt for participation in physical therapy.  Pt agreeable to PT session. Pt's wife, daughter, and daughter's friend present during session.    Exercises  Educated pt and pt's family on pt safety regarding awareness concerns.   Assessment/Plan    PT Assessment Patient needs continued PT services  PT Problem List Decreased safety awareness;Decreased knowledge of precautions;Decreased balance       PT Treatment Interventions Gait training;Stair training;Functional mobility training;Therapeutic activities;Therapeutic exercise;Balance training;Neuromuscular re-education;Patient/family education    PT Goals (Current goals can be found in the Care Plan section)  Acute Rehab PT Goals Patient Stated Goal: to go home and back to work PT Goal Formulation: With patient/family Time For Goal Achievement: 02/08/19 Potential to Achieve Goals: Good  Frequency 7X/week   Barriers to discharge        Co-evaluation               AM-PAC PT "6 Clicks" Mobility  Outcome Measure Help needed turning from your back to your side while in a flat bed without using bedrails?: None Help needed moving from lying on your back to sitting on the side of a flat bed without using bedrails?: None Help needed moving to and from a bed to a chair (including a wheelchair)?: None Help needed standing up from a chair using your arms (e.g., wheelchair or bedside chair)?: None Help needed to walk in hospital room?: A Little Help  needed climbing 3-5 steps with a railing? : A Little 6 Click Score: 22    End of Session Equipment Utilized During Treatment: Gait belt Activity Tolerance: Patient tolerated treatment well Patient left: in bed;with call bell/phone within reach;with family/visitor present;Other (comment)(Nurse notified to verify bed alarm on) Nurse Communication: Mobility status;Precautions;Other (comment) PT Visit Diagnosis: Other abnormalities of gait and mobility (R26.89)    Time: 9629-5284 PT Time Calculation (min) (ACUTE ONLY): 47 min   Charges:   PT Evaluation $PT Eval Low Complexity: 1 Low PT Treatments $Therapeutic Activity: 8-22 mins       Hendricks Limes, PT 01/25/19, 2:20 PM [phone]

## 2019-01-26 LAB — HIV ANTIBODY (ROUTINE TESTING W REFLEX): HIV Screen 4th Generation wRfx: NONREACTIVE

## 2019-01-28 ENCOUNTER — Other Ambulatory Visit (HOSPITAL_COMMUNITY): Payer: Self-pay | Admitting: Interventional Radiology

## 2019-01-28 ENCOUNTER — Ambulatory Visit (HOSPITAL_COMMUNITY)
Admission: RE | Admit: 2019-01-28 | Discharge: 2019-01-28 | Disposition: A | Discharge status: Home / Self Care | Payer: BLUE CROSS/BLUE SHIELD | Source: Ambulatory Visit | Attending: Interventional Radiology | Admitting: Interventional Radiology

## 2019-01-28 ENCOUNTER — Telehealth: Payer: Self-pay | Admitting: Internal Medicine

## 2019-01-28 DIAGNOSIS — I639 Cerebral infarction, unspecified: Secondary | ICD-10-CM

## 2019-01-28 DIAGNOSIS — I6502 Occlusion and stenosis of left vertebral artery: Secondary | ICD-10-CM | POA: Diagnosis not present

## 2019-01-28 NOTE — Telephone Encounter (Signed)
First attempt made for TCM no answer left voice mail to please return call to office.

## 2019-01-28 NOTE — Consult Note (Signed)
Chief Complaint: Patient was seen in consultation today for left VA stenosis.  Referring Physician(s): Gouru, Aruna  Supervising Physician: Julieanne Cotton  Patient Status: [facility] - Out-pt  History of Present Illness: Jesse Black is a 63 y.o. male with a past medical history as below, with pertinent past medical history including hypertension and CVA 01/2019. He presented to [facility] ED 01/24/2019 with complaints of slow speech and confusion. He was found to have an acute CVA and was admitted for further management. He was discharged home 01/25/2019 on APT with neurology follow-up.  CT head 01/24/2019: 1. Small lacunar infarcts within the LEFT thalamus and LEFT external capsule, favored to be remote or subacute. 2. Periventricular white matter changes consistent with small vessel disease. 3. No evidence for acute intracranial abnormality.  MR brain 01/24/2019: 1. 1.2 cm acute infarction at the base of the brain on the left at the junction of the cerebral peduncle, anterior thalamus and radiating white matter tracts. 2. Extensive old small vessel type infarctions elsewhere throughout the brain as outlined above. 3. One could consider the possibility of demyelinating disease coexisting with small-vessel disease in this case, but that is less likely.  US carotids 01/25/2019: 1. Mild (1-49%) stenosis proximal right internal carotid artery secondary to trace heterogeneous atherosclerotic plaque. 2. No significant atherosclerotic plaque or evidence of stenosis in the left internal carotid artery. 3. Vertebral arteries are patent with normal antegrade flow.  CTA head/neck 01/25/2019: 1. Intracranial atherosclerosis including moderate right and severe left P2 stenoses, mild right and moderate left M1 stenoses, and severe left A2 stenosis. 2. Strongly dominant left vertebral artery with moderate to severe V4 stenosis. 3. Extremely diminutive right vertebral artery. 4. Widely patent carotid  arteries.  IR requested by Dr. Amado Coe for management of left VA stenosis. Patient awake and alert sitting in chair. Accompanied by wife and daughter. Patient states that he has not noticed any symptoms. Patient's wife states that she has noticed "behavioral changes" that have occurred for approximately 2 weeks. States that he has had increased forgetfulness and "excessive humor". Patient's wife complains of gait issues, stating that her husband will "kick out" his right leg when walking. Denies headache, weakness, numbness/tingling, dizziness, vision changes, hearing changes, tinnitus, or speech difficulty.  Patient is currently taking Plavix 75 mg once daily and Aspirin 81 mg once daily.   Past Medical History:  Diagnosis Date  . High blood pressure     Past Surgical History:  Procedure Laterality Date  . COLONOSCOPY WITH PROPOFOL N/A 10/05/2015   Procedure: COLONOSCOPY WITH PROPOFOL;  Surgeon: Scot Jun, MD;  Location: [facility] ENDOSCOPY;  Service: Endoscopy;  Laterality: N/A;  . NO PAST SURGERIES      Allergies: Patient has no allergy information on record.  Medications: Prior to Admission medications   Medication Sig Start Date End Date Taking? Authorizing Provider  amLODipine (NORVASC) 5 MG tablet Take 1 tablet (5 mg total) by mouth daily. 01/26/19   Ramonita Lab, MD  atorvastatin (LIPITOR) 40 MG tablet Take 1 tablet (40 mg total) by mouth daily at 6 PM. 01/25/19   Gouru, Aruna, MD  clopidogrel (PLAVIX) 75 MG tablet Take 1 tablet (75 mg total) by mouth daily. 01/26/19   Ramonita Lab, MD  hydrALAZINE (APRESOLINE) 25 MG tablet Take 1 tablet (25 mg total) by mouth every 8 (eight) hours. 01/25/19   Ramonita Lab, MD  metoprolol tartrate (LOPRESSOR) 25 MG tablet Take 1 tablet (25 mg total) by mouth 2 (two) times daily. 01/25/19  Ramonita Lab, MD  Multiple Vitamin (MULTI-VITAMIN DAILY) TABS Take 1 tablet by mouth daily.    [provider]  omega-3 acid ethyl esters (LOVAZA) 1  g capsule Take 1 g by mouth daily.    [provider]  senna-docusate (SENOKOT-S) 8.6-50 MG tablet Take 1 tablet by mouth at bedtime as needed for mild constipation. 01/25/19   Ramonita Lab, MD     Family History  Problem Relation Age of Onset  . Lung cancer Father     Social History   Socioeconomic History  . Marital status: Married    Spouse name: Not on file  . Number of children: Not on file  . Years of education: Not on file  . Highest education level: Not on file  Occupational History    Employer: [employer]  Social Needs  . Financial resource strain: Not on file  . Food insecurity:    Worry: Not on file    Inability: Not on file  . Transportation needs:    Medical: Not on file    Non-medical: Not on file  Tobacco Use  . Smoking status: Never Smoker  . Smokeless tobacco: Never Used  Substance and Sexual Activity  . Alcohol use: No    Comment: occ  . Drug use: No  . Sexual activity: Yes  Lifestyle  . Physical activity:    Days per week: Not on file    Minutes per session: Not on file  . Stress: Not on file  Relationships  . Social connections:    Talks on phone: Not on file    Gets together: Not on file    Attends religious service: Not on file    Active member of club or organization: Not on file    Attends meetings of clubs or organizations: Not on file    Relationship status: Not on file  Other Topics Concern  . Not on file  Social History Narrative  . Not on file     Review of Systems: A 12 point ROS discussed and pertinent positives are indicated in the HPI above.  All other systems are negative.  Review of Systems  Constitutional: Negative for chills and fever.  HENT: Negative for hearing loss and tinnitus.   Eyes: Negative for visual disturbance.  Respiratory: Negative for shortness of breath and wheezing.   Cardiovascular: Negative for chest pain and palpitations.  Musculoskeletal: Positive for gait problem.  Neurological: Negative  for dizziness, speech difficulty, weakness, numbness and headaches.  Psychiatric/Behavioral: Negative for behavioral problems and confusion.    Physical Exam Constitutional:      General: He is not in acute distress.    Appearance: Normal appearance.  Pulmonary:     Effort: Pulmonary effort is normal. No respiratory distress.  Skin:    General: Skin is warm and dry.  Neurological:     Mental Status: He is alert and oriented to person, place, and time.  Psychiatric:        Mood and Affect: Mood normal.        Behavior: Behavior normal.        Thought Content: Thought content normal.        Judgment: Judgment normal.      Imaging: Ct Angio Head W Or Wo Contrast  Result Date: 01/25/2019 CLINICAL DATA:  Stroke follow-up. Acute left thalamic infarct on MRI. EXAM: CT ANGIOGRAPHY HEAD AND NECK TECHNIQUE: Multidetector CT imaging of the head and neck was performed using the standard protocol during bolus administration  of intravenous contrast. Multiplanar CT image reconstructions and MIPs were obtained to evaluate the vascular anatomy. Carotid stenosis measurements (when applicable) are obtained utilizing NASCET criteria, using the distal internal carotid diameter as the denominator. CONTRAST:  75mL OMNIPAQUE IOHEXOL 350 MG/ML SOLN COMPARISON:  Brain MRI 01/24/2019. Carotid Doppler ultrasound 01/25/2019. FINDINGS: CT HEAD FINDINGS Brain: An acute lacunar infarct is again noted near the ventral margin of the left thalamus. Patchy cerebral white matter hypodensities are nonspecific but compatible with moderately advanced chronic small vessel ischemic disease. Chronic lacunar infarcts are noted in the corona radiata bilaterally. No acute intracranial hemorrhage, mass, midline shift, or extra-axial fluid collection is identified. Vascular: As below. Skull: No fracture or focal osseous lesion. Sinuses: Paranasal sinuses and mastoid air cells are clear. Orbits: Unremarkable. Review of the MIP images  confirms the above findings CTA NECK FINDINGS Aortic arch: Normal variant aortic arch branching pattern with common origin of the brachiocephalic and left common carotid arteries. Widely patent arch vessel origins. Right carotid system: Patent without evidence of stenosis or dissection. Tortuous proximal ICA. Left carotid system: Patent without evidence of stenosis or dissection. Vertebral arteries: Patent and strongly dominant left vertebral artery with mild atherosclerotic plaque at its origin not resulting in significant stenosis. Extremely diminutive but grossly patent right vertebral artery with assessment limited proximally by venous contrast. Skeleton: Advanced disc degeneration at C5-6 and C6-7 with asymmetric right neural foraminal stenosis at both levels due to uncovertebral spurring. Other neck: No evidence of acute abnormality or mass. Upper chest: Clear lung apices. Review of the MIP images confirms the above findings CTA HEAD FINDINGS Anterior circulation: The internal carotid arteries are widely patent from skull base to carotid termini. ACAs and MCAs are patent without evidence of proximal branch occlusion. The right A1 segment is hypoplastic. There are bilateral distal M1 stenoses, mild on the right and moderate on the left. There is a severe mid left A2 stenosis, and there is ACA and MCA distal branch vessel irregularity bilaterally. No aneurysm is identified. Posterior circulation: Focal calcified plaque in the left V4 segment results in moderate to severe stenosis. The right V4 segment is diminutive with a possible severe stenosis versus short segment occlusion in its midportion though assessment is limited by the small size of the vessel and beam hardening at the skull base. Patent left PICA and bilateral SCA origins are identified. The basilar artery is patent without evidence of significant stenosis. There is a fetal origin of the left PCA. There are moderate right and severe left P2 stenoses  with marked irregular narrowing and attenuation of more distal PCA branch vessels bilaterally. No aneurysm is identified. Venous sinuses: Patent. Anatomic variants: Fetal left PCA.  Hypoplastic right A1. Delayed phase: No abnormal enhancement. Review of the MIP images confirms the above findings IMPRESSION: 1. Intracranial atherosclerosis including moderate right and severe left P2 stenoses, mild right and moderate left M1 stenoses, and severe left A2 stenosis. 2. Strongly dominant left vertebral artery with moderate to severe V4 stenosis. 3. Extremely diminutive right vertebral artery. 4. Widely patent carotid arteries. Electronically Signed   By: Sebastian Ache M.D.   On: 01/25/2019 14:09   Ct Head Wo Contrast  Result Date: 01/24/2019 CLINICAL DATA:  Pt arrives with family with concerns over increased altered mental status since Friday. Pt appropriate in triage; however family reports bizarre behavior. LKW last Friday. EXAM: CT HEAD WITHOUT CONTRAST TECHNIQUE: Contiguous axial images were obtained from the base of the skull through the vertex without intravenous  contrast. COMPARISON:  None. FINDINGS: Brain: Small lacunar infarcts are identified within the LEFT thalamus and LEFT external capsule and favored to be remote or less likely, subacute. There are periventricular white matter changes consistent with small vessel disease. There is no intra or extra-axial fluid collection or mass lesion. The basilar cisterns and ventricles have a normal appearance. There is no CT evidence for acute infarction or hemorrhage. Vascular: No hyperdense vessel or unexpected calcification. Skull: Normal. Negative for fracture or focal lesion. Sinuses/Orbits: No acute finding. Other: None. IMPRESSION: 1. Small lacunar infarcts within the LEFT thalamus and LEFT external capsule, favored to be remote or subacute. 2. Periventricular white matter changes consistent with small vessel disease. 3. No evidence for acute intracranial  abnormality. Electronically Signed   By: Norva Pavlov M.D.   On: 01/24/2019 13:19   Ct Angio Neck W Or Wo Contrast  Result Date: 01/25/2019 CLINICAL DATA:  Stroke follow-up. Acute left thalamic infarct on MRI. EXAM: CT ANGIOGRAPHY HEAD AND NECK TECHNIQUE: Multidetector CT imaging of the head and neck was performed using the standard protocol during bolus administration of intravenous contrast. Multiplanar CT image reconstructions and MIPs were obtained to evaluate the vascular anatomy. Carotid stenosis measurements (when applicable) are obtained utilizing NASCET criteria, using the distal internal carotid diameter as the denominator. CONTRAST:  66mL OMNIPAQUE IOHEXOL 350 MG/ML SOLN COMPARISON:  Brain MRI 01/24/2019. Carotid Doppler ultrasound 01/25/2019. FINDINGS: CT HEAD FINDINGS Brain: An acute lacunar infarct is again noted near the ventral margin of the left thalamus. Patchy cerebral white matter hypodensities are nonspecific but compatible with moderately advanced chronic small vessel ischemic disease. Chronic lacunar infarcts are noted in the corona radiata bilaterally. No acute intracranial hemorrhage, mass, midline shift, or extra-axial fluid collection is identified. Vascular: As below. Skull: No fracture or focal osseous lesion. Sinuses: Paranasal sinuses and mastoid air cells are clear. Orbits: Unremarkable. Review of the MIP images confirms the above findings CTA NECK FINDINGS Aortic arch: Normal variant aortic arch branching pattern with common origin of the brachiocephalic and left common carotid arteries. Widely patent arch vessel origins. Right carotid system: Patent without evidence of stenosis or dissection. Tortuous proximal ICA. Left carotid system: Patent without evidence of stenosis or dissection. Vertebral arteries: Patent and strongly dominant left vertebral artery with mild atherosclerotic plaque at its origin not resulting in significant stenosis. Extremely diminutive but grossly  patent right vertebral artery with assessment limited proximally by venous contrast. Skeleton: Advanced disc degeneration at C5-6 and C6-7 with asymmetric right neural foraminal stenosis at both levels due to uncovertebral spurring. Other neck: No evidence of acute abnormality or mass. Upper chest: Clear lung apices. Review of the MIP images confirms the above findings CTA HEAD FINDINGS Anterior circulation: The internal carotid arteries are widely patent from skull base to carotid termini. ACAs and MCAs are patent without evidence of proximal branch occlusion. The right A1 segment is hypoplastic. There are bilateral distal M1 stenoses, mild on the right and moderate on the left. There is a severe mid left A2 stenosis, and there is ACA and MCA distal branch vessel irregularity bilaterally. No aneurysm is identified. Posterior circulation: Focal calcified plaque in the left V4 segment results in moderate to severe stenosis. The right V4 segment is diminutive with a possible severe stenosis versus short segment occlusion in its midportion though assessment is limited by the small size of the vessel and beam hardening at the skull base. Patent left PICA and bilateral SCA origins are identified. The basilar artery is  patent without evidence of significant stenosis. There is a fetal origin of the left PCA. There are moderate right and severe left P2 stenoses with marked irregular narrowing and attenuation of more distal PCA branch vessels bilaterally. No aneurysm is identified. Venous sinuses: Patent. Anatomic variants: Fetal left PCA.  Hypoplastic right A1. Delayed phase: No abnormal enhancement. Review of the MIP images confirms the above findings IMPRESSION: 1. Intracranial atherosclerosis including moderate right and severe left P2 stenoses, mild right and moderate left M1 stenoses, and severe left A2 stenosis. 2. Strongly dominant left vertebral artery with moderate to severe V4 stenosis. 3. Extremely diminutive  right vertebral artery. 4. Widely patent carotid arteries. Electronically Signed   By: Sebastian AcheAllen  Grady M.D.   On: 01/25/2019 14:09   Mr Brain Wo Contrast  Result Date: 01/24/2019 CLINICAL DATA:  Behavioral disturbance. Hallucinating. Symptoms developing over the last week. EXAM: MRI HEAD WITHOUT CONTRAST TECHNIQUE: Multiplanar, multiecho pulse sequences of the brain and surrounding structures were obtained without intravenous contrast. COMPARISON:  Head CT same day FINDINGS: Brain: Diffusion imaging shows a 1.2 cm acute infarction at the junction of the left cerebral peduncle, anterior thalamus and radiating white matter tracts. No other acute infarction or other cause of restricted diffusion. There are multifocal white matter lesions affecting the cerebral hemispheric deep and subcortical white matter. One of the lesions in the left posterior frontal white matter shows fluid intensity material centrally and shows some T2 shine through on diffusion imaging along the margins of that. There is an old small vessel infarction in the right thalamus. There is an old small vessel infarction in the left pons with hemosiderin deposition. Certainly, in this patient with hypertension, small vessel infarctions could explain this entire picture. However, one could at least consider the possibility, though unlikely, of demyelinating disease in this case. Vascular: Major vessels at the base of the brain show flow. Skull and upper cervical spine: Negative Sinuses/Orbits: Clear/normal Other: None IMPRESSION: 1.2 cm acute infarction at the base of the brain on the left at the junction of the cerebral peduncle, anterior thalamus and radiating white matter tracts. Extensive old small vessel type infarctions elsewhere throughout the brain as outlined above. One could consider the possibility of demyelinating disease coexisting with small-vessel disease in this case, but that is less likely. Electronically Signed   By: Paulina FusiMark  Shogry M.D.    On: 01/24/2019 15:06   [place] Carotid Bilateral (at [facility] And Ap Only)  Result Date: 01/25/2019 CLINICAL DATA:  63 year old male with new onset left basal ganglia lacunar infarcts. EXAM: BILATERAL CAROTID DUPLEX ULTRASOUND TECHNIQUE: Wallace CullensGray scale imaging, color Doppler and duplex ultrasound were performed of bilateral carotid and vertebral arteries in the neck. COMPARISON:  Brain MRI 01/24/2019 FINDINGS: Criteria: Quantification of carotid stenosis is based on velocity parameters that correlate the residual internal carotid diameter with NASCET-based stenosis levels, using the diameter of the distal internal carotid lumen as the denominator for stenosis measurement. The following velocity measurements were obtained: RIGHT ICA: 63/24 cm/sec CCA: 51/11 cm/sec SYSTOLIC ICA/CCA RATIO:  1.2 ECA:  67 cm/sec LEFT ICA: 59/21 cm/sec CCA: 70/17 cm/sec SYSTOLIC ICA/CCA RATIO:  0.8 ECA:  68 cm/sec RIGHT CAROTID ARTERY: Trace heterogeneous atherosclerotic plaque in the proximal internal carotid artery. By peak systolic velocity criteria, the estimated stenosis remains less than 50%. RIGHT VERTEBRAL ARTERY:  Patent with normal antegrade flow. LEFT CAROTID ARTERY: No significant atherosclerotic plaque or evidence of stenosis in the internal carotid artery. LEFT VERTEBRAL ARTERY:  Patent with normal antegrade  flow. IMPRESSION: 1. Mild (1-49%) stenosis proximal right internal carotid artery secondary to trace heterogeneous atherosclerotic plaque. 2. No significant atherosclerotic plaque or evidence of stenosis in the left internal carotid artery. 3. Vertebral arteries are patent with normal antegrade flow. Signed, Sterling Big, MD, RPVI [facility] Electronically Signed   By: Malachy Moan M.D.   On: 01/25/2019 09:13    Labs:  CBC: Recent Labs    01/24/19 1239  WBC 9.0  HGB 15.5  HCT 43.1  PLT 224    COAGS: No results for input(s): INR, APTT in the  last 8760 hours.  BMP: Recent Labs    01/24/19 1239 01/25/19 0642  NA 139 135  K 3.6 3.9  CL 103 103  CO2 30 29  GLUCOSE 117* 113*  BUN 12 15  CALCIUM 9.0 8.5*  CREATININE 1.25* 1.17  GFRNONAA >60 >60  GFRAA >60 >60    LIVER FUNCTION TESTS: Recent Labs    01/24/19 1239  BILITOT 1.0  AST 62*  ALT 106*  ALKPHOS 56  PROT 7.1  ALBUMIN 4.0    Assessment and Plan:  Left VA stenosis. Dr. Corliss Skains was present for consultation. Reviewed imaging with patient and family. First brought to their attention was patient's recent stroke. Next brought to their attention was patient's left VA stenosis. Explained that this is a possible cause of his stroke, but we cannot be sure. However, because his right VA is occluded, explained that there are two management options moving forward- either monitoring with routine imaging scans or with a procedure called an image-guided diagnostic cerebral arteriogram. Explained procedure, including risks and benefits. Explained the indication for this procedure is to quantify patient's left VA stenosis and see if he is a candidate for revascularization. Patient expresses desire to move forward with procedure.  Discussed patient's hypertension. Patient states that he has been taking his medications as prescribed since discharge 01/25/2019. Instructed patient to check his BP and record daily. Instructed patient to follow-up with PCP regularly (recommend at least once every 3 months) regarding BP management.  Discussed stoke like symptoms and instructed patient to call 911 immediately if he notices any of these symptoms (weakness/numbness/tingling of one side, facial droop, gait disturbances, dizziness, vision changes, ect.).  Instructed patient not to exercise until BP is controlled. Instructed patient to decrease his salt and fried food intake.  Plan for follow-up with an image-guided diagnostic cerebral arteriogram ASAP. Informed patient that our  schedulers will call him to set up this procedure. Instructed patient to continue taking Plavix 75 mg once daily and Aspirin 81 mg once daily.  All questions answered and concerns addressed. Patient conveys understanding and agrees with plan.  Thank you for this interesting consult.  I greatly enjoyed meeting Jesse Black and look forward to participating in their care.  A copy of this report was sent to the requesting provider on this date.  Electronically Signed: Elwin Mocha, PA-C 01/28/2019, 10:31 AM   I spent a total of 60 Minutes in face to face in clinical consultation, greater than 50% of which was counseling/coordinating care for left VA stenosis.

## 2019-01-29 NOTE — Telephone Encounter (Signed)
Second attempt made for TCM call no answer left voicemail to return call to office patient has an appointment for 01/30/19 at  Pm with provider for HFU and TOC.

## 2019-01-30 ENCOUNTER — Other Ambulatory Visit (HOSPITAL_COMMUNITY): Payer: Self-pay | Admitting: Interventional Radiology

## 2019-01-30 ENCOUNTER — Ambulatory Visit (INDEPENDENT_AMBULATORY_CARE_PROVIDER_SITE_OTHER): Payer: BLUE CROSS/BLUE SHIELD | Admitting: Internal Medicine

## 2019-01-30 ENCOUNTER — Encounter: Payer: Self-pay | Admitting: Internal Medicine

## 2019-01-30 VITALS — BP 150/74 | HR 73 | Temp 98.1°F | Ht 67.0 in | Wt 204.2 lb

## 2019-01-30 DIAGNOSIS — R748 Abnormal levels of other serum enzymes: Secondary | ICD-10-CM

## 2019-01-30 DIAGNOSIS — I639 Cerebral infarction, unspecified: Secondary | ICD-10-CM

## 2019-01-30 DIAGNOSIS — Z125 Encounter for screening for malignant neoplasm of prostate: Secondary | ICD-10-CM

## 2019-01-30 DIAGNOSIS — R7303 Prediabetes: Secondary | ICD-10-CM

## 2019-01-30 DIAGNOSIS — Z1159 Encounter for screening for other viral diseases: Secondary | ICD-10-CM

## 2019-01-30 DIAGNOSIS — E559 Vitamin D deficiency, unspecified: Secondary | ICD-10-CM | POA: Diagnosis not present

## 2019-01-30 DIAGNOSIS — Z1329 Encounter for screening for other suspected endocrine disorder: Secondary | ICD-10-CM

## 2019-01-30 DIAGNOSIS — Z23 Encounter for immunization: Secondary | ICD-10-CM

## 2019-01-30 DIAGNOSIS — E538 Deficiency of other specified B group vitamins: Secondary | ICD-10-CM

## 2019-01-30 DIAGNOSIS — Z8673 Personal history of transient ischemic attack (TIA), and cerebral infarction without residual deficits: Secondary | ICD-10-CM | POA: Diagnosis not present

## 2019-01-30 DIAGNOSIS — I10 Essential (primary) hypertension: Secondary | ICD-10-CM

## 2019-01-30 DIAGNOSIS — E785 Hyperlipidemia, unspecified: Secondary | ICD-10-CM

## 2019-01-30 DIAGNOSIS — I6521 Occlusion and stenosis of right carotid artery: Secondary | ICD-10-CM

## 2019-01-30 DIAGNOSIS — I672 Cerebral atherosclerosis: Secondary | ICD-10-CM

## 2019-01-30 MED ORDER — OLMESARTAN MEDOXOMIL-HCTZ 20-12.5 MG PO TABS: 1.0000 | ORAL_TABLET | Freq: Every day | ORAL | 0 refills | Status: DC

## 2019-01-30 NOTE — Progress Notes (Signed)
Pre visit review using our clinic review tool, if applicable. No additional management support is needed unless otherwise documented below in the visit note. 

## 2019-01-30 NOTE — Patient Instructions (Addendum)
Lacunar Stroke  A stroke is the sudden death of brain tissue that occurs when an area of the brain does not get enough oxygen. A lacunar stroke (lacunar infarction) is caused by a blockage in one of the small arteries deep in the brain. Lacunar stroke is a medical emergency that must be treated right away.It is important to get help right away as soon as you notice symptoms of stroke. What are the causes? A lacunar stroke occurs when small arteries deep in the brain become more narrow due to a buildup of fatty deposits in the arteries (atherosclerosis). When the arteries narrow, less blood flows to certain areas of the brain. Without enough blood and oxygen, these parts of the brain can die or become permanently damaged. What increases the risk? You are more likely to develop this condition if:  You have high blood pressure (hypertension).  You have high cholesterol (hyperlipidemia).  You smoke.  You have diabetes.  You are obese.  You have an unhealthy diet. This includes foods that are high in saturated fat, trans fat, and salt (sodium).  You have a heart rhythm disorder (atrial fibrillation).  You have heart disease.  You have artery disease, such as carotid artery disease or peripheral artery disease.  You have sickle cell disease.  You are age 63 or older.  You have a personal or family history of stroke.  You are African-American.  You are a woman who: ? Is pregnant. ? Has a history of diabetes during pregnancy (gestational diabetes). ? Has a history of preeclampsia or eclampsia. ? Has had hormone therapy after menopause. ? Uses oral birth control (contraception), especially while smoking. What are the signs or symptoms? Symptoms of this condition usually develop suddenly. They may include:  Weakness or numbness in your face, arm, or leg, especially on one side of your body.  Trouble walking or difficulty moving your arms or legs.  Loss of balance or  coordination.  Confusion.  Slurred speech (dysarthria).  Trouble speaking, understanding speech, or both (aphasia).  Vision problems in one or both eyes.  Dizziness.  Nausea and vomiting.  Severe headache with no known cause. How is this diagnosed? This condition may be diagnosed based on:  Your symptoms and medical history.  A physical exam.  Blood tests.  A CT scan of the brain.  MRI.  Ultrasound of an artery. This may help find blood flow problems or blockages.  Angiogram. During this test, dye is injected into your blood and then an X-ray is done to look for blockages. The dye helps blood flow and blockages show up clearly on X-rays.  Electroencephalogram (EEG). This test checks electrical activity in the brain. How is this treated? This condition must be treated within 4.5 hours of the start of the stroke. It is treated with IV medicine that dissolves the blood clot (tissue plasminogen activator, TPA) that is causing the blockage. The goal is to restore blood flow to the brain as soon as possible. Your health care provider may prescribe blood thinners (antiplatelets or anticoagulants) to lower your risk of another stroke. Follow these instructions at home: Medicines  Take over-the-counter and other prescription medicines only as told by your health care provider. This includes diabetes or cholesterol medicine.  If you were told to take a medicine to thin your blood, such as aspirin, take it exactly as told by your health care provider. ? Taking too much blood-thinning medicine can cause bleeding. ? If you do not take enough  blood-thinning medicine, you will not have the protection that you need against another stroke and other problems. Eating and drinking  Follow instructions from your health care provider about eating and drinking restrictions.  Eat a healthy diet. This includes plenty of fruits and vegetables, lean meats, whole grains, and low-fat dairy  products.  Avoid foods high in saturated fat, trans fat, or sodium.  Limit alcohol intake to no more than 1 drink a day for nonpregnant women and 2 drinks a day for men. One drink equals 12 oz of beer, 5 oz of wine, or 1 oz of hard liquor. Safety  If you need help walking, use a cane or walker as told by your health care provider.  Take steps to lower the risk of falls in your home. This may include: ? Using safety equipment, such as raised toilets and a seat in the shower. ? Removing clutter and tripping hazards from walkways, such as cords or rugs. ? Installing grab bars in the bedroom and bathroom. Activity  Exercise regularly, as told by your health care provider.  Take part in rehabilitation programs as told by your health care provider. This may include physical therapy, occupational therapy, or speech therapy. General instructions  Do not use any tobacco products, such as cigarettes, chewing tobacco, and e-cigarettes. If you need help quitting, ask your health care provider.  Keep all follow-up visits as told by your health care provider. This is important. Get help right away if:   You have any symptoms of stroke. "BE FAST" is an easy way to remember the main warning signs of stroke: ? B - Balance. Signs are dizziness, sudden trouble walking, or loss of balance. ? E - Eyes. Signs are trouble seeing or a sudden change in vision. ? F - Face. Signs are sudden weakness or numbness of the face, or the face or eyelid drooping on one side. ? A - Arms. Signs are weakness or numbness in an arm. This happens suddenly and usually on one side of the body. ? S - Speech. Signs are sudden trouble speaking, slurred speech, or trouble understanding what people say. ? T - Time. Time to call emergency services. Write down what time symptoms started.  You have other signs of stroke, such as: ? A sudden, severe headache with no known cause. ? Nausea or vomiting. ? Seizure.  You have a  severe fall or injury. These symptoms may represent a serious problem that is an emergency. Do not wait to see if the symptoms will go away. Get medical help right away. Call your local emergency services (911 in the [place] Do not drive yourself to the hospital. Summary  A lacunar stroke (lacunar infarction) is a blockage of one of the small arteries deep in the brain. When one of these arteries is blocked, parts of the brain do not get enough oxygen and may die.  This condition is a medical emergency that must be treated right away. Treatments must be done within 4.5 hours of the start of the stroke.  Controlling your risk factors for stroke is the best way to avoid another lacunar stroke.  Get help right away if you have any symptoms of stroke. "BE FAST" is an easy way to remember the main warning signs of stroke. This information is not intended to replace advice given to you by your health care provider. Make sure you discuss any questions you have with your health care provider. Document Released: 04/14/2017 Document Revised: 08/17/2018 Document  Reviewed: 04/14/2017 [employer] Interactive Patient Education  2019 [employer]  Nonalcoholic Fatty Liver Disease Diet Nonalcoholic fatty liver disease is a condition that causes fat to accumulate in and around the liver. The disease makes it harder for the liver to work the way that it should. Following a healthy diet can help to keep nonalcoholic fatty liver disease under control. It can also help to prevent or improve conditions that are associated with the disease, such as heart disease, diabetes, high blood pressure, and abnormal cholesterol levels. Along with regular exercise, this diet:  Promotes weight loss.  Helps to control blood sugar levels.  Helps to improve the way that the body uses insulin. What do I need to know about this diet?  Use the glycemic index (GI) to plan your meals. The index tells you how quickly a food will raise  your blood sugar. Choose low-GI foods. These foods take a longer time to raise blood sugar.  Keep track of how many calories you take in. Eating the right amount of calories will help you to achieve a healthy weight.  You may want to follow a Mediterranean diet. This diet includes a lot of vegetables, lean meats or fish, whole grains, fruits, and healthy oils and fats. What foods can I eat? Grains Whole grains, such as whole-wheat or whole-grain breads, crackers, tortillas, cereals, and pasta. Stone-ground whole wheat. Pumpernickel bread. Unsweetened oatmeal. Bulgur. Barley. Quinoa. Brown or wild rice. Corn or whole-wheat flour tortillas. Vegetables Lettuce. Spinach. Peas. Beets. Cauliflower. Cabbage. Broccoli. Carrots. Tomatoes. Squash. Eggplant. Herbs. Peppers. Onions. Cucumbers. Brussels sprouts. Yams and sweet potatoes. Beans. Lentils. Fruits Bananas. Apples. Oranges. Grapes. Papaya. Mango. Pomegranate. Kiwi. Grapefruit. Cherries. Meats and Other Protein Sources Seafood and shellfish. Lean meats. Poultry. Tofu. Dairy Low-fat or fat-free dairy products, such as yogurt, cottage cheese, and cheese. Beverages Water. Sugar-free drinks. Tea. Coffee. Low-fat or skim milk. Milk alternatives, such as soy or almond milk. Real fruit juice. Condiments Mustard. Relish. Low-fat, low-sugar ketchup and barbecue sauce. Low-fat or fat-free mayonnaise. Sweets and Desserts Sugar-free sweets. Fats and Oils Avocado. Canola or olive oil. Nuts and nut butters. Seeds. The items listed above may not be a complete list of recommended foods or beverages. Contact your dietitian for more options. What foods are not recommended? Palm oil and coconut oil. Processed foods. Fried foods. Sweetened drinks, such as sweet tea, milkshakes, snow cones, iced sweet drinks, and sodas. Alcohol. Sweets. Foods that contain a lot of salt or sodium. The items listed above may not be a complete list of foods and beverages to avoid.  Contact your dietitian for more information. This information is not intended to replace advice given to you by your health care provider. Make sure you discuss any questions you have with your health care provider. Document Released: 04/14/2015 Document Revised: 05/05/2016 Document Reviewed: 12/23/2014 Elsevier Interactive Patient Education  2019 [employer]  Fatty Liver Disease  Fatty liver disease occurs when too much fat has built up in your liver cells. Fatty liver disease is also called hepatic steatosis or steatohepatitis. The liver removes harmful substances from your bloodstream and produces fluids that your body needs. It also helps your body use and store energy from the food you eat. In many cases, fatty liver disease does not cause symptoms or problems. It is often diagnosed when tests are being done for other reasons. However, over time, fatty liver can cause inflammation that may lead to more serious liver problems, such as scarring of the liver (cirrhosis)  and liver failure. Fatty liver is associated with insulin resistance, increased body fat, high blood pressure (hypertension), and high cholesterol. These are features of metabolic syndrome and increase your risk for stroke, diabetes, and heart disease. What are the causes? This condition may be caused by:  Drinking too much alcohol.  Poor nutrition.  Obesity.  Cushing's syndrome.  Diabetes.  High cholesterol.  Certain drugs.  Poisons.  Some viral infections.  Pregnancy. What increases the risk? You are more likely to develop this condition if you:  Abuse alcohol.  Are overweight.  Have diabetes.  Have hepatitis.  Have a high triglyceride level.  Are pregnant. What are the signs or symptoms? Fatty liver disease often does not cause symptoms. If symptoms do develop, they can include:  Fatigue.  Weakness.  Weight loss.  Confusion.  Abdominal pain.  Nausea and vomiting.  Yellowing of your  skin and the white parts of your eyes (jaundice).  Itchy skin. How is this diagnosed? This condition may be diagnosed by:  A physical exam and medical history.  Blood tests.  Imaging tests, such as an ultrasound, CT scan, or MRI.  A liver biopsy. A small sample of liver tissue is removed using a needle. The sample is then looked at under a microscope. How is this treated? Fatty liver disease is often caused by other health conditions. Treatment for fatty liver may involve medicines and lifestyle changes to manage conditions such as:  Alcoholism.  High cholesterol.  Diabetes.  Being overweight or obese. Follow these instructions at home:   Do not drink alcohol. If you have trouble quitting, ask your health care provider how to safely quit with the help of medicine or a supervised program. This is important to keep your condition from getting worse.  Eat a healthy diet as told by your health care provider. Ask your health care provider about working with a diet and nutrition specialist (dietitian) to develop an eating plan.  Exercise regularly. This can help you lose weight and control your cholesterol and diabetes. Talk to your health care provider about an exercise plan and which activities are best for you.  Take over-the-counter and prescription medicines only as told by your health care provider.  Keep all follow-up visits as told by your health care provider. This is important. Contact a health care provider if: You have trouble controlling your:  Blood sugar. This is especially important if you have diabetes.  Cholesterol.  Drinking of alcohol. Get help right away if:  You have abdominal pain.  You have jaundice.  You have nausea and vomiting.  You vomit blood or material that looks like coffee grounds.  You have stools that are black, tar-like, or bloody. Summary  Fatty liver disease develops when too much fat builds up in the cells of your  liver.  Fatty liver disease often causes no symptoms or problems. However, over time, fatty liver can cause inflammation that may lead to more serious liver problems, such as scarring of the liver (cirrhosis).  You are more likely to develop this condition if you abuse alcohol, are pregnant, are overweight, have diabetes, have hepatitis, or have high triglyceride levels.  Contact your health care provider if you have trouble controlling your weight, blood sugar, cholesterol, or drinking of alcohol. This information is not intended to replace advice given to you by your health care provider. Make sure you discuss any questions you have with your health care provider. Document Released: 01/13/2006 Document Revised: 09/06/2017 Document Reviewed:  09/06/2017 Elsevier Interactive Patient Education  [employer].

## 2019-01-31 ENCOUNTER — Encounter: Payer: Self-pay | Admitting: Internal Medicine

## 2019-01-31 ENCOUNTER — Other Ambulatory Visit: Payer: Self-pay | Admitting: Internal Medicine

## 2019-01-31 DIAGNOSIS — I672 Cerebral atherosclerosis: Secondary | ICD-10-CM | POA: Insufficient documentation

## 2019-01-31 DIAGNOSIS — R748 Abnormal levels of other serum enzymes: Secondary | ICD-10-CM | POA: Insufficient documentation

## 2019-01-31 DIAGNOSIS — E785 Hyperlipidemia, unspecified: Secondary | ICD-10-CM | POA: Insufficient documentation

## 2019-01-31 DIAGNOSIS — I6521 Occlusion and stenosis of right carotid artery: Secondary | ICD-10-CM | POA: Insufficient documentation

## 2019-01-31 DIAGNOSIS — E559 Vitamin D deficiency, unspecified: Secondary | ICD-10-CM | POA: Insufficient documentation

## 2019-01-31 DIAGNOSIS — R7303 Prediabetes: Secondary | ICD-10-CM

## 2019-01-31 LAB — COMPREHENSIVE METABOLIC PANEL
ALT: 126 U/L — ABNORMAL HIGH (ref 0–53)
AST: 61 U/L — ABNORMAL HIGH (ref 0–37)
Albumin: 4.4 g/dL (ref 3.5–5.2)
Alkaline Phosphatase: 68 U/L (ref 39–117)
BUN: 11 mg/dL (ref 6–23)
CO2: 25 meq/L (ref 19–32)
Calcium: 9.7 mg/dL (ref 8.4–10.5)
Chloride: 105 mEq/L (ref 96–112)
Creatinine, Ser: 1.33 mg/dL (ref 0.40–1.50)
GFR: 54.36 mL/min — ABNORMAL LOW (ref >60.00)
Glucose, Bld: 128 mg/dL — ABNORMAL HIGH (ref 70–99)
Potassium: 4.6 mEq/L (ref 3.5–5.1)
Sodium: 138 mEq/L (ref 135–145)
Total Bilirubin: 0.7 mg/dL (ref 0.2–1.2)
Total Protein: 7 g/dL (ref 6.0–8.3)

## 2019-01-31 LAB — HEPATITIS B SURFACE ANTIGEN: HEP B S AG: NONREACTIVE

## 2019-01-31 LAB — HEPATITIS B SURFACE ANTIBODY, QUANTITATIVE: Hep B S AB Quant (Post): <5 m[IU]/mL — ABNORMAL LOW (ref >=10)

## 2019-01-31 LAB — VITAMIN B12: Vitamin B-12: 421 pg/mL (ref 211–911)

## 2019-01-31 LAB — TSH: TSH: 1.71 u[IU]/mL (ref 0.35–4.50)

## 2019-01-31 LAB — HEPATITIS A ANTIBODY, TOTAL: Hepatitis A AB,Total: NONREACTIVE

## 2019-01-31 LAB — VITAMIN D 25 HYDROXY (VIT D DEFICIENCY, FRACTURES): VITD: 17.61 ng/mL — AB (ref 30.00–100.00)

## 2019-01-31 LAB — PSA: PSA: 1.42 ng/mL (ref 0.10–4.00)

## 2019-01-31 MED ORDER — CHOLECALCIFEROL 1.25 MG (50000 UT) PO CAPS: 50000.0000 [IU] | ORAL_CAPSULE | ORAL | 1 refills | Status: DC

## 2019-01-31 NOTE — Progress Notes (Signed)
Chief Complaint  Patient presents with  . Transitions Of Care   TOC/HFU wife present today   1. Multiple new and old strokes and intracranial atherosclerosis noted on MRI/CT 2/13/-2/14 and pt had elevated BP was noncompliant with medications and BP readings 200-220s/90s lost to f/u for some time in our clinic. He is not a smoker A1C 5.8 prediabetes. Wife is concerned about pt rambling and personality change I.e memory loss (I.e a Zenaida Niece blew up on him though he was not injured) with FH dementia. His mother had dementia. He is on plavix and below BP meds and wants to see Dr. Sherryll Burger and asks about work and driving which I will defer to neurology  2. HTN uncontrolled at home per wife 150s-160s/90 on current meds norvasc 5 mg, hydralazine 25 tid, lopressor 25 mg bid.  3. HLD uncontrolled no lipitor 40 mg qhs now  4. Elevated lfts though trending down wife has some ? About how much pt is drinking as she finds empty cans of beer and pt does not remember if he drank them though today he reports drinking 2 beer at a time.   Review of Systems  Constitutional: Negative for weight loss.  HENT: Negative for hearing loss.   Eyes: Negative for blurred vision.  Respiratory: Negative for shortness of breath.   Cardiovascular: Negative for chest pain.  Gastrointestinal: Negative for abdominal pain.  Musculoskeletal: Negative for falls.  Skin: Negative for rash.  Neurological: Negative for headaches.  Psychiatric/Behavioral: Positive for memory loss.   Past Medical History:  Diagnosis Date  . High blood pressure   . Hyperlipidemia   . Prediabetes   . Stroke [facility])    Past Surgical History:  Procedure Laterality Date  . COLONOSCOPY WITH PROPOFOL N/A 10/05/2015   Procedure: COLONOSCOPY WITH PROPOFOL;  Surgeon: Scot Jun, MD;  Location: [facility] END[facility]COPY;  Service: Endoscopy;  Laterality: N/A;  . NO PAST SURGERIES     Family History  Problem Relation Age of Onset  . Lung cancer Father    Social  History   Socioeconomic History  . Marital status: Married    Spouse name: Not on file  . Number of children: Not on file  . Years of education: Not on file  . Highest education level: Not on file  Occupational History    Employer: [employer]  Social Needs  . Financial resource strain: Not on file  . Food insecurity:    Worry: Not on file    Inability: Not on file  . Transportation needs:    Medical: Not on file    Non-medical: Not on file  Tobacco Use  . Smoking status: Never Smoker  . Smokeless tobacco: Never Used  Substance and Sexual Activity  . Alcohol use: No    Comment: occ  . Drug use: No  . Sexual activity: Yes  Lifestyle  . Physical activity:    Days per week: Not on file    Minutes per session: Not on file  . Stress: Not on file  Relationships  . Social connections:    Talks on phone: Not on file    Gets together: Not on file    Attends religious service: Not on file    Active member of club or organization: Not on file    Attends meetings of clubs or organizations: Not on file    Relationship status: Not on file  . Intimate partner violence:    Fear of current or ex partner: Not on file  Emotionally abused: Not on file    Physically abused: Not on file    Forced sexual activity: Not on file  Other Topics Concern  . Not on file  Social History Narrative  . Not on file   Current Meds  Medication Sig  . amLODipine (NORVASC) 5 MG tablet Take 1 tablet (5 mg total) by mouth daily.  Marland Kitchen. atorvastatin (LIPITOR) 40 MG tablet Take 1 tablet (40 mg total) by mouth daily at 6 PM.  . clopidogrel (PLAVIX) 75 MG tablet Take 1 tablet (75 mg total) by mouth daily.  . hydrALAZINE (APRESOLINE) 25 MG tablet Take 1 tablet (25 mg total) by mouth every 8 (eight) hours.  . metoprolol tartrate (LOPRESSOR) 25 MG tablet Take 1 tablet (25 mg total) by mouth 2 (two) times daily.  . Multiple Vitamin (MULTI-VITAMIN DAILY) TABS Take 1 tablet by mouth daily.  Marland Kitchen. omega-3 acid ethyl  esters (LOVAZA) 1 g capsule Take 1 g by mouth daily.  Marland Kitchen. senna-docusate (SENOKOT-S) 8.6-50 MG tablet Take 1 tablet by mouth at bedtime as needed for mild constipation.   Not on File Recent Results (from the past 2160 hour(s))  Glucose, capillary     Status: Abnormal   Collection Time: 01/24/19 11:59 AM  Result Value Ref Range   Glucose-Capillary 132 (H) 70 - 99 mg/dL  CBC     Status: Abnormal   Collection Time: 01/24/19 12:39 PM  Result Value Ref Range   WBC 9.0 4.0 - 10.5 K/uL   RBC 4.55 4.22 - 5.81 MIL/uL   Hemoglobin 15.5 13.0 - 17.0 g/dL   HCT 16.143.1 09.639.0 - 04.552.0 %   MCV 94.7 80.0 - 100.0 fL   MCH 34.1 (H) 26.0 - 34.0 pg   MCHC 36.0 30.0 - 36.0 g/dL   RDW 40.912.7 81.111.5 - 91.415.5 %   Platelets 224 150 - 400 K/uL   nRBC 0.0 0.0 - 0.2 %    Comment: Performed at [facility], [address] [place], [place] [address]  Comprehensive metabolic panel     Status: Abnormal   Collection Time: 01/24/19 12:39 PM  Result Value Ref Range   Sodium 139 135 - 145 mmol/L   Potassium 3.6 3.5 - 5.1 mmol/L   Chloride 103 98 - 111 mmol/L   CO2 30 22 - 32 mmol/L   Glucose, Bld 117 (H) 70 - 99 mg/dL   BUN 12 8 - 23 mg/dL   Creatinine, Ser 6.211.25 (H) 0.61 - 1.24 mg/dL   Calcium 9.0 8.9 - 30.810.3 mg/dL   Total Protein 7.1 6.5 - 8.1 g/dL   Albumin 4.0 3.5 - 5.0 g/dL   AST 62 (H) 15 - 41 U/L   ALT 106 (H) 0 - 44 U/L   Alkaline Phosphatase 56 38 - 126 U/L   Total Bilirubin 1.0 0.3 - 1.2 mg/dL   GFR calc non Af Amer >60 >60 mL/min   GFR calc Af Amer >60 >60 mL/min   Anion gap 6 5 - 15    Comment: Performed at [facility], [address] [place], [place] [address]  Ethanol     Status: None   Collection Time: 01/24/19 12:39 PM  Result Value Ref Range   Alcohol, Ethyl (B) <10 <10 mg/dL    Comment: (NOTE) Lowest detectable limit for serum alcohol is 10 mg/dL. For medical purposes only. Performed at [facility], [address] [place], [place] [address]   Urine Drug Screen,  Qualitative [facility] only)  Status: None   Collection Time: 01/24/19  1:31 PM  Result Value Ref Range   Tricyclic, Ur Screen NONE DETECTED NONE DETECTED   Amphetamines, Ur Screen NONE DETECTED NONE DETECTED   MDMA (Ecstasy)Ur Screen NONE DETECTED NONE DETECTED   Cocaine Metabolite,Ur [place] NONE DETECTED NONE DETECTED   Opiate, Ur Screen NONE DETECTED NONE DETECTED   Phencyclidine (PCP) Ur S NONE DETECTED NONE DETECTED   Cannabinoid 50 Ng, Ur [place] NONE DETECTED NONE DETECTED   Barbiturates, Ur Screen NONE DETECTED NONE DETECTED   Benzodiazepine, Ur Scrn NONE DETECTED NONE DETECTED   Methadone Scn, Ur NONE DETECTED NONE DETECTED    Comment: (NOTE) Tricyclics + metabolites, urine    Cutoff 1000 ng/mL Amphetamines + metabolites, urine  Cutoff 1000 ng/mL MDMA (Ecstasy), urine              Cutoff 500 ng/mL Cocaine Metabolite, urine          Cutoff 300 ng/mL Opiate + metabolites, urine        Cutoff 300 ng/mL Phencyclidine (PCP), urine         Cutoff 25 ng/mL Cannabinoid, urine                 Cutoff 50 ng/mL Barbiturates + metabolites, urine  Cutoff 200 ng/mL Benzodiazepine, urine              Cutoff 200 ng/mL Methadone, urine                   Cutoff 300 ng/mL The urine drug screen provides only a preliminary, unconfirmed analytical test result and should not be used for non-medical purposes. Clinical consideration and professional judgment should be applied to any positive drug screen result due to possible interfering substances. A more specific alternate chemical method must be used in order to obtain a confirmed analytical result. Gas chromatography / mass spectrometry (GC/MS) is the preferred confirmat ory method. Performed at [facility], [address] [place], [place] [address]   Urinalysis, Complete w Microscopic     Status: Abnormal   Collection Time: 01/24/19  1:31 PM  Result Value Ref Range   Color, Urine YELLOW (A) YELLOW   APPearance CLEAR (A) CLEAR   Specific  Gravity, Urine 1.012 1.005 - 1.030   pH 6.0 5.0 - 8.0   Glucose, UA NEGATIVE NEGATIVE mg/dL   Hgb urine dipstick NEGATIVE NEGATIVE   Bilirubin Urine NEGATIVE NEGATIVE   Ketones, ur NEGATIVE NEGATIVE mg/dL   Protein, ur NEGATIVE NEGATIVE mg/dL   Nitrite NEGATIVE NEGATIVE   Leukocytes,Ua NEGATIVE NEGATIVE   WBC, UA 0-5 0 - 5 WBC/hpf   Bacteria, UA NONE SEEN NONE SEEN   Squamous Epithelial / LPF NONE SEEN 0 - 5   Mucus PRESENT    Hyaline Casts, UA PRESENT     Comment: Performed at [facility], [address] [place], [place] [address]  Hemoglobin A1c     Status: Abnormal   Collection Time: 01/25/19  6:39 AM  Result Value Ref Range   Hgb A1c MFr Bld 5.8 (H) 4.8 - 5.6 %    Comment: (NOTE) Pre diabetes:          5.7%-6.4% Diabetes:              >6.4% Glycemic control for   <7.0% adults with diabetes    Mean Plasma Glucose 119.76 mg/dL    Comment: Performed at [facility], [address] [place], [place] [address]  HIV  antibody (Routine Testing)     Status: None   Collection Time: 01/25/19  6:42 AM  Result Value Ref Range   HIV Screen 4th Generation wRfx Non Reactive Non Reactive    Comment: (NOTE) Performed At: [facility] [address] [place], [place] [address] Jolene Schimke MD WU:[id number]   Lipid panel     Status: Abnormal   Collection Time: 01/25/19  6:42 AM  Result Value Ref Range   Cholesterol 145 0 - 200 mg/dL   Triglycerides 956 (H) <150 mg/dL   HDL 30 (L) >21 mg/dL   Total CHOL/HDL Ratio 4.8 RATIO   VLDL 53 (H) 0 - 40 mg/dL   LDL Cholesterol 62 0 - 99 mg/dL    Comment:        Total Cholesterol/HDL:CHD Risk Coronary Heart Disease Risk Table                     Men   Women  1/2 Average Risk   3.4   3.3  Average Risk       5.0   4.4  2 X Average Risk   9.6   7.1  3 X Average Risk  23.4   11.0        Use the calculated Patient Ratio above and the CHD Risk Table to determine the patient's CHD Risk.        ATP III CLASSIFICATION  (LDL):  <100     mg/dL   Optimal  308-657  mg/dL   Near or Above                    Optimal  130-159  mg/dL   Borderline  846-962  mg/dL   High  >952     mg/dL   Very High Performed at [facility], [address] [place], [place] [address]   Basic metabolic panel     Status: Abnormal   Collection Time: 01/25/19  6:42 AM  Result Value Ref Range   Sodium 135 135 - 145 mmol/L   Potassium 3.9 3.5 - 5.1 mmol/L   Chloride 103 98 - 111 mmol/L   CO2 29 22 - 32 mmol/L   Glucose, Bld 113 (H) 70 - 99 mg/dL   BUN 15 8 - 23 mg/dL   Creatinine, Ser 4.40 0.61 - 1.24 mg/dL   Calcium 8.5 (L) 8.9 - 10.3 mg/dL   GFR calc non Af Amer >60 >60 mL/min   GFR calc Af Amer >60 >60 mL/min   Anion gap 3 (L) 5 - 15    Comment: Performed at [facility], [address] [place], [place] [address]  ECHOCARDIOGRAM COMPLETE     Status: None   Collection Time: 01/25/19  9:56 AM  Result Value Ref Range   Weight 3,255.75 oz   Height 67 in   BP 176/98 mmHg   Objective  Body mass index is 31.98 kg/m. Wt Readings from Last 3 Encounters:  01/30/19 204 lb 3.2 oz (92.6 kg)  01/24/19 203 lb 7.8 oz (92.3 kg)  10/05/15 190 lb (86.2 kg)   Temp Readings from Last 3 Encounters:  01/30/19 98.1 F (36.7 C) (Oral)  01/25/19 98 F (36.7 C) (Oral)  10/05/15 (!) 96.7 F (35.9 C) (Tympanic)   BP Readings from Last 3 Encounters:  01/30/19 122/76  01/25/19 (!) 161/90  10/05/15 119/80   Pulse Readings from Last 3 Encounters:  01/30/19 73  01/25/19 63  10/05/15 68  Physical Exam Vitals signs and nursing note reviewed.  Constitutional:      Appearance: Normal appearance. He is well-developed and well-groomed.  HENT:     Head: Normocephalic and atraumatic.     Nose: Nose normal.     Mouth/Throat:     Mouth: Mucous membranes are moist.     Pharynx: Oropharynx is clear.  Eyes:     Conjunctiva/sclera: Conjunctivae normal.     Pupils: Pupils are equal, round, and reactive to light.   Cardiovascular:     Rate and Rhythm: Normal rate and regular rhythm.     Heart sounds: Normal heart sounds. No murmur.  Pulmonary:     Effort: Pulmonary effort is normal.     Breath sounds: Normal breath sounds.  Skin:    General: Skin is warm and dry.  Neurological:     General: No focal deficit present.     Mental Status: He is alert and oriented to person, place, and time. Mental status is at baseline.     Gait: Gait normal.  Psychiatric:        Attention and Perception: Attention and perception normal.        Mood and Affect: Mood and affect normal.        Speech: Speech normal.        Behavior: Behavior normal. Behavior is cooperative.        Thought Content: Thought content normal.        Cognition and Memory: Cognition and memory normal.        Judgment: Judgment normal.     Assessment   1. Multiple new and old strokes strokes and c/w memory loss  Right CAS 1-49%, intracranial atherosclerosis moderate to severe  2. HTN/HLD  3. Elevated lfts ? Fatty liver vs alcohol related pt cant quantitate how much he is drinking hcv neg 07/30/15  4. HM Plan   1. Reviewed echo EF 55-50% mild MR IR procedure scheduled 02/05/2019  Referred to Dr. Sherryll Burger  rec cards referral holter given stroke hx wife will get back with me on name  Log BP  2. Add olmesartan-hctz 20-12.5 mg qam  3. Recheck lfts, hep A/B labs today  US liver  4.  Flu shot given today  tdap utd 06/18/15 Consider shingrix in future   Check cmet, psa, tsh, hep A/B, TSH, B12, vitamin D today  Colonoscopy 10/05/15 Dr. Markham Jordan + polyps bx neg repeat in 10 years  PSA today needs DRE in future   Provider: Dr. French Ana McLean-Scocuzza-Internal Medicine

## 2019-02-01 ENCOUNTER — Telehealth: Payer: Self-pay

## 2019-02-01 ENCOUNTER — Other Ambulatory Visit: Payer: Self-pay | Admitting: Internal Medicine

## 2019-02-01 DIAGNOSIS — I639 Cerebral infarction, unspecified: Secondary | ICD-10-CM

## 2019-02-01 DIAGNOSIS — I672 Cerebral atherosclerosis: Secondary | ICD-10-CM

## 2019-02-01 DIAGNOSIS — I6521 Occlusion and stenosis of right carotid artery: Secondary | ICD-10-CM

## 2019-02-01 DIAGNOSIS — I10 Essential (primary) hypertension: Secondary | ICD-10-CM

## 2019-02-01 DIAGNOSIS — E785 Hyperlipidemia, unspecified: Secondary | ICD-10-CM

## 2019-02-01 NOTE — Telephone Encounter (Signed)
Copied from CRM [id number]. Topic: Referral - Request for Referral >> Feb 01, 2019  3:39 PM Angela Nevin wrote: Has patient seen PCP for this complaint? Yes  Referral for which specialty: Cardiology  Preferred provider/office: Marcina Millard, MD, PhD  Cardiologist   Patient's wife states she was advised by Dr. Judie Grieve to contact office with this information.

## 2019-02-02 ENCOUNTER — Other Ambulatory Visit: Payer: Self-pay | Admitting: Radiology

## 2019-02-04 ENCOUNTER — Other Ambulatory Visit: Payer: Self-pay | Admitting: Physician Assistant

## 2019-02-05 ENCOUNTER — Other Ambulatory Visit (HOSPITAL_COMMUNITY): Payer: Self-pay | Admitting: Interventional Radiology

## 2019-02-05 ENCOUNTER — Ambulatory Visit (HOSPITAL_COMMUNITY)
Admission: RE | Admit: 2019-02-05 | Discharge: 2019-02-05 | Disposition: A | Discharge status: Home / Self Care | Payer: BLUE CROSS/BLUE SHIELD | Source: Ambulatory Visit | Attending: Interventional Radiology | Admitting: Interventional Radiology

## 2019-02-05 ENCOUNTER — Encounter (HOSPITAL_COMMUNITY): Payer: Self-pay

## 2019-02-05 DIAGNOSIS — I639 Cerebral infarction, unspecified: Secondary | ICD-10-CM

## 2019-02-05 DIAGNOSIS — E785 Hyperlipidemia, unspecified: Secondary | ICD-10-CM | POA: Diagnosis not present

## 2019-02-05 DIAGNOSIS — Z79899 Other long term (current) drug therapy: Secondary | ICD-10-CM | POA: Insufficient documentation

## 2019-02-05 DIAGNOSIS — R7303 Prediabetes: Secondary | ICD-10-CM | POA: Diagnosis not present

## 2019-02-05 DIAGNOSIS — Z8673 Personal history of transient ischemic attack (TIA), and cerebral infarction without residual deficits: Secondary | ICD-10-CM | POA: Insufficient documentation

## 2019-02-05 DIAGNOSIS — Z7902 Long term (current) use of antithrombotics/antiplatelets: Secondary | ICD-10-CM | POA: Insufficient documentation

## 2019-02-05 DIAGNOSIS — I6502 Occlusion and stenosis of left vertebral artery: Secondary | ICD-10-CM | POA: Diagnosis not present

## 2019-02-05 DIAGNOSIS — I6602 Occlusion and stenosis of left middle cerebral artery: Secondary | ICD-10-CM | POA: Insufficient documentation

## 2019-02-05 HISTORY — PX: IR ANGIO VERTEBRAL SEL VERTEBRAL UNI L MOD SED: IMG5367

## 2019-02-05 HISTORY — PX: IR US GUIDE VASC ACCESS RIGHT: IMG2390

## 2019-02-05 HISTORY — PX: IR ANGIO VERTEBRAL SEL SUBCLAVIAN INNOMINATE UNI R MOD SED: IMG5365

## 2019-02-05 HISTORY — PX: IR ANGIO INTRA EXTRACRAN SEL COM CAROTID INNOMINATE BILAT MOD SED: IMG5360

## 2019-02-05 LAB — BASIC METABOLIC PANEL
Anion gap: 11 (ref 5–15)
BUN: 16 mg/dL (ref 8–23)
CO2: 23 mmol/L (ref 22–32)
Calcium: 9.1 mg/dL (ref 8.9–10.3)
Chloride: 101 mmol/L (ref 98–111)
Creatinine, Ser: 1.19 mg/dL (ref 0.61–1.24)
GFR calc Af Amer: >60 mL/min (ref >60)
GFR calc non Af Amer: >60 mL/min (ref >60)
GLUCOSE: 131 mg/dL — AB (ref 70–99)
Potassium: 3.8 mmol/L (ref 3.5–5.1)
Sodium: 135 mmol/L (ref 135–145)

## 2019-02-05 LAB — CBC
HCT: 47.5 % (ref 39.0–52.0)
Hemoglobin: 16.9 g/dL (ref 13.0–17.0)
MCH: 33.8 pg (ref 26.0–34.0)
MCHC: 35.6 g/dL (ref 30.0–36.0)
MCV: 95 fL (ref 80.0–100.0)
Platelets: 237 10*3/uL (ref 150–400)
RBC: 5 MIL/uL (ref 4.22–5.81)
RDW: 12.6 % (ref 11.5–15.5)
WBC: 9.7 10*3/uL (ref 4.0–10.5)
nRBC: 0 % (ref 0.0–0.2)

## 2019-02-05 LAB — PROTIME-INR
INR: 1 (ref 0.8–1.2)
Prothrombin Time: 13.3 seconds (ref 11.4–15.2)

## 2019-02-05 MED ORDER — VERAPAMIL HCL 2.5 MG/ML IV SOLN
INTRAVENOUS | Status: AC
  Filled 2019-02-05: qty 2

## 2019-02-05 MED ORDER — FENTANYL CITRATE (PF) 100 MCG/2ML IJ SOLN
INTRAMUSCULAR | Status: AC | PRN
  Administered 2019-02-05: 25 ug via INTRAVENOUS

## 2019-02-05 MED ORDER — LIDOCAINE HCL 1 % IJ SOLN
INTRAMUSCULAR | Status: AC
  Filled 2019-02-05: qty 20

## 2019-02-05 MED ORDER — IOHEXOL 300 MG/ML  SOLN: 120.0000 mL | Freq: Once | INTRAMUSCULAR | Status: DC | PRN

## 2019-02-05 MED ORDER — IOPAMIDOL (ISOVUE-300) INJECTION 61%
INTRAVENOUS | Status: AC
  Filled 2019-02-05: qty 50

## 2019-02-05 MED ORDER — VERAPAMIL HCL 2.5 MG/ML IV SOLN
INTRAVENOUS | Status: AC | PRN
  Administered 2019-02-05: 2.5 mg via INTRA_ARTERIAL

## 2019-02-05 MED ORDER — HEPARIN SODIUM (PORCINE) 1000 UNIT/ML IJ SOLN
INTRAMUSCULAR | Status: AC | PRN
  Administered 2019-02-05: 2000 [IU] via INTRA_ARTERIAL

## 2019-02-05 MED ORDER — LIDOCAINE HCL 1 % IJ SOLN
INTRAMUSCULAR | Status: AC | PRN
  Administered 2019-02-05: 10 mL

## 2019-02-05 MED ORDER — NITROGLYCERIN 1 MG/10 ML FOR IR/CATH LAB
INTRA_ARTERIAL | Status: AC | PRN
  Administered 2019-02-05 (×3): 200 ug via INTRA_ARTERIAL

## 2019-02-05 MED ORDER — FENTANYL CITRATE (PF) 100 MCG/2ML IJ SOLN
INTRAMUSCULAR | Status: AC
  Filled 2019-02-05: qty 2

## 2019-02-05 MED ORDER — SODIUM CHLORIDE 0.9 % IV SOLN
Freq: Once | INTRAVENOUS | Status: AC
  Administered 2019-02-05: 07:00:00 via INTRAVENOUS

## 2019-02-05 MED ORDER — HEPARIN SODIUM (PORCINE) 1000 UNIT/ML IJ SOLN
INTRAMUSCULAR | Status: AC
  Filled 2019-02-05: qty 1

## 2019-02-05 MED ORDER — MIDAZOLAM HCL 2 MG/2ML IJ SOLN
INTRAMUSCULAR | Status: AC | PRN
  Administered 2019-02-05: 1 mg via INTRAVENOUS

## 2019-02-05 MED ORDER — NITROGLYCERIN 1 MG/10 ML FOR IR/CATH LAB
INTRA_ARTERIAL | Status: AC
  Filled 2019-02-05: qty 10

## 2019-02-05 MED ORDER — MIDAZOLAM HCL 2 MG/2ML IJ SOLN
INTRAMUSCULAR | Status: AC
  Filled 2019-02-05: qty 2

## 2019-02-05 MED ORDER — SODIUM CHLORIDE 0.9 % IV SOLN: INTRAVENOUS | Status: AC

## 2019-02-05 NOTE — Sedation Documentation (Signed)
15cc of air in Right radial TR band. Placed by Dr. Corliss Skains

## 2019-02-05 NOTE — Procedures (Signed)
S/P 4 vessel cerebral arteriogram RT radial approach. Findings. 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 2.Lt PCA mod to severe stenosis of P1-P2 ,and P2-P3  Segs. 3.Bilateral pericallosal A2 region sremosis L.R. 4.RT MCA inf division areas of  Moderate stenosis. 5.Mod RT PCA P1-P2 stenosis.

## 2019-02-05 NOTE — H&P (Signed)
Chief Complaint: Patient was seen in consultation today for cerebral arteriogram at the request of Dr Elveria Royals  Supervising Physician: Julieanne Cotton  Patient Status: [facility] - Out-pt  History of Present Illness: Jesse Black is a 63 y.o. male   Hx CVA 01/24/19 Presented to ED at [facility]- slow speech and confusion Wife says pt has had some symptoms for few weeks before presentation "same personality as always-- just Ramped up" Residual symptoms: forgetfulness; judgement not yet 100%  CTA head/neck 01/25/2019: 1. Intracranial atherosclerosis including moderate right and severe left P2 stenoses, mild right and moderate left M1 stenoses, and severe left A2 stenosis. 2. Strongly dominant left vertebral artery with moderate to severe V4 stenosis. 3. Extremely diminutive right vertebral artery. 4. Widely patent carotid arteries.  Was seen by Dr Corliss Skains in consult 01/28/19 Scheduled now for diagnostic cerebral arteriogram  Past Medical History:  Diagnosis Date  . High blood pressure   . Hyperlipidemia   . Memory loss   . Prediabetes   . Stroke (cerebrum) (HCC)   . Stroke [facility])     Past Surgical History:  Procedure Laterality Date  . COLONOSCOPY WITH PROPOFOL N/A 10/05/2015   Procedure: COLONOSCOPY WITH PROPOFOL;  Surgeon: Scot Jun, MD;  Location: [facility] ENDOSCOPY;  Service: Endoscopy;  Laterality: N/A;    Allergies: Patient has no known allergies.  Medications: Prior to Admission medications   Medication Sig Start Date End Date Taking? Authorizing Provider  amLODipine (NORVASC) 5 MG tablet Take 1 tablet (5 mg total) by mouth daily. 01/26/19   Ramonita Lab, MD  atorvastatin (LIPITOR) 40 MG tablet Take 1 tablet (40 mg total) by mouth daily at 6 PM. 01/25/19   Gouru, Aruna, MD  Cholecalciferol 1.25 MG (50000 UT) capsule Take 1 capsule (50,000 Units total) by mouth once a week. 01/31/19   McLean-Scocuzza, Pasty Spillers, MD  clopidogrel (PLAVIX) 75 MG tablet Take 1 tablet  (75 mg total) by mouth daily. 01/26/19   Ramonita Lab, MD  hydrALAZINE (APRESOLINE) 25 MG tablet Take 1 tablet (25 mg total) by mouth every 8 (eight) hours. 01/25/19   Ramonita Lab, MD  metoprolol tartrate (LOPRESSOR) 25 MG tablet Take 1 tablet (25 mg total) by mouth 2 (two) times daily. 01/25/19   Ramonita Lab, MD  Multiple Vitamin (MULTI-VITAMIN DAILY) TABS Take 1 tablet by mouth daily.    [provider]  olmesartan-hydrochlorothiazide (BENICAR HCT) 20-12.5 MG tablet Take 1 tablet by mouth daily. In am 01/30/19   McLean-Scocuzza, Pasty Spillers, MD  omega-3 acid ethyl esters (LOVAZA) 1 g capsule Take 1 g by mouth daily.    [provider]  senna-docusate (SENOKOT-S) 8.6-50 MG tablet Take 1 tablet by mouth at bedtime as needed for mild constipation. 01/25/19   Ramonita Lab, MD     Family History  Problem Relation Age of Onset  . Lung cancer Father   . Dementia Mother     Social History   Socioeconomic History  . Marital status: Married    Spouse name: Not on file  . Number of children: Not on file  . Years of education: Not on file  . Highest education level: Not on file  Occupational History    Employer: [employer]  Social Needs  . Financial resource strain: Not on file  . Food insecurity:    Worry: Not on file    Inability: Not on file  . Transportation needs:    Medical: Not on file    Non-medical: Not on file  Tobacco Use  . Smoking status: Never Smoker  . Smokeless tobacco: Never Used  Substance and Sexual Activity  . Alcohol use: Yes    Comment: ?amt   . Drug use: No  . Sexual activity: Yes  Lifestyle  . Physical activity:    Days per week: Not on file    Minutes per session: Not on file  . Stress: Not on file  Relationships  . Social connections:    Talks on phone: Not on file    Gets together: Not on file    Attends religious service: Not on file    Active member of club or organization: Not on file    Attends meetings of clubs or organizations: Not  on file    Relationship status: Not on file  Other Topics Concern  . Not on file  Social History Narrative   Married    Kids     Review of Systems: A 12 point ROS discussed and pertinent positives are indicated in the HPI above.  All other systems are negative.  Review of Systems  Constitutional: Positive for activity change. Negative for diaphoresis, fatigue and fever.  HENT: Positive for voice change. Negative for tinnitus and trouble swallowing.   Eyes: Negative for visual disturbance.  Respiratory: Negative for cough and shortness of breath.   Gastrointestinal: Negative for abdominal pain.  Musculoskeletal: Negative for gait problem.  Neurological: Negative for dizziness, tremors, seizures, syncope, facial asymmetry, speech difficulty, weakness, light-headedness, numbness and headaches.  Psychiatric/Behavioral: Positive for decreased concentration. Negative for behavioral problems and confusion.    Vital Signs: BP (!) 155/85   Pulse 72   Temp 98.1 F (36.7 C) (Oral)   Resp 16   Ht 5\' 7"  (1.702 m)   Wt 205 lb (93 kg)   SpO2 96%   BMI 32.11 kg/m   Physical Exam Vitals signs reviewed.  Constitutional:      Appearance: Normal appearance.  HENT:     Head: Atraumatic.  Eyes:     Extraocular Movements: Extraocular movements intact.  Neck:     Musculoskeletal: Normal range of motion.  Cardiovascular:     Rate and Rhythm: Normal rate and regular rhythm.     Heart sounds: Normal heart sounds.  Pulmonary:     Effort: Pulmonary effort is normal.     Breath sounds: Normal breath sounds.  Abdominal:     Palpations: Abdomen is soft.  Musculoskeletal: Normal range of motion.        General: No swelling or tenderness.  Skin:    General: Skin is warm and dry.  Neurological:     General: No focal deficit present.     Mental Status: He is alert and oriented to person, place, and time.  Psychiatric:        Mood and Affect: Mood normal.        Behavior: Behavior normal.          Thought Content: Thought content normal.        Judgment: Judgment normal.     Imaging: Ct Angio Head W Or Wo Contrast  Result Date: 01/25/2019 CLINICAL DATA:  Stroke follow-up. Acute left thalamic infarct on MRI. EXAM: CT ANGIOGRAPHY HEAD AND NECK TECHNIQUE: Multidetector CT imaging of the head and neck was performed using the standard protocol during bolus administration of intravenous contrast. Multiplanar CT image reconstructions and MIPs were obtained to evaluate the vascular anatomy. Carotid stenosis measurements (when applicable) are obtained utilizing NASCET criteria, using the distal internal  carotid diameter as the denominator. CONTRAST:  75mL OMNIPAQUE IOHEXOL 350 MG/ML SOLN COMPARISON:  Brain MRI 01/24/2019. Carotid Doppler ultrasound 01/25/2019. FINDINGS: CT HEAD FINDINGS Brain: An acute lacunar infarct is again noted near the ventral margin of the left thalamus. Patchy cerebral white matter hypodensities are nonspecific but compatible with moderately advanced chronic small vessel ischemic disease. Chronic lacunar infarcts are noted in the corona radiata bilaterally. No acute intracranial hemorrhage, mass, midline shift, or extra-axial fluid collection is identified. Vascular: As below. Skull: No fracture or focal osseous lesion. Sinuses: Paranasal sinuses and mastoid air cells are clear. Orbits: Unremarkable. Review of the MIP images confirms the above findings CTA NECK FINDINGS Aortic arch: Normal variant aortic arch branching pattern with common origin of the brachiocephalic and left common carotid arteries. Widely patent arch vessel origins. Right carotid system: Patent without evidence of stenosis or dissection. Tortuous proximal ICA. Left carotid system: Patent without evidence of stenosis or dissection. Vertebral arteries: Patent and strongly dominant left vertebral artery with mild atherosclerotic plaque at its origin not resulting in significant stenosis. Extremely diminutive but  grossly patent right vertebral artery with assessment limited proximally by venous contrast. Skeleton: Advanced disc degeneration at C5-6 and C6-7 with asymmetric right neural foraminal stenosis at both levels due to uncovertebral spurring. Other neck: No evidence of acute abnormality or mass. Upper chest: Clear lung apices. Review of the MIP images confirms the above findings CTA HEAD FINDINGS Anterior circulation: The internal carotid arteries are widely patent from skull base to carotid termini. ACAs and MCAs are patent without evidence of proximal branch occlusion. The right A1 segment is hypoplastic. There are bilateral distal M1 stenoses, mild on the right and moderate on the left. There is a severe mid left A2 stenosis, and there is ACA and MCA distal branch vessel irregularity bilaterally. No aneurysm is identified. Posterior circulation: Focal calcified plaque in the left V4 segment results in moderate to severe stenosis. The right V4 segment is diminutive with a possible severe stenosis versus short segment occlusion in its midportion though assessment is limited by the small size of the vessel and beam hardening at the skull base. Patent left PICA and bilateral SCA origins are identified. The basilar artery is patent without evidence of significant stenosis. There is a fetal origin of the left PCA. There are moderate right and severe left P2 stenoses with marked irregular narrowing and attenuation of more distal PCA branch vessels bilaterally. No aneurysm is identified. Venous sinuses: Patent. Anatomic variants: Fetal left PCA.  Hypoplastic right A1. Delayed phase: No abnormal enhancement. Review of the MIP images confirms the above findings IMPRESSION: 1. Intracranial atherosclerosis including moderate right and severe left P2 stenoses, mild right and moderate left M1 stenoses, and severe left A2 stenosis. 2. Strongly dominant left vertebral artery with moderate to severe V4 stenosis. 3. Extremely  diminutive right vertebral artery. 4. Widely patent carotid arteries. Electronically Signed   By: Sebastian Ache M.D.   On: 01/25/2019 14:09   Ct Head Wo Contrast  Result Date: 01/24/2019 CLINICAL DATA:  Pt arrives with family with concerns over increased altered mental status since Friday. Pt appropriate in triage; however family reports bizarre behavior. LKW last Friday. EXAM: CT HEAD WITHOUT CONTRAST TECHNIQUE: Contiguous axial images were obtained from the base of the skull through the vertex without intravenous contrast. COMPARISON:  None. FINDINGS: Brain: Small lacunar infarcts are identified within the LEFT thalamus and LEFT external capsule and favored to be remote or less likely, subacute. There are  periventricular white matter changes consistent with small vessel disease. There is no intra or extra-axial fluid collection or mass lesion. The basilar cisterns and ventricles have a normal appearance. There is no CT evidence for acute infarction or hemorrhage. Vascular: No hyperdense vessel or unexpected calcification. Skull: Normal. Negative for fracture or focal lesion. Sinuses/Orbits: No acute finding. Other: None. IMPRESSION: 1. Small lacunar infarcts within the LEFT thalamus and LEFT external capsule, favored to be remote or subacute. 2. Periventricular white matter changes consistent with small vessel disease. 3. No evidence for acute intracranial abnormality. Electronically Signed   By: Norva Pavlov M.D.   On: 01/24/2019 13:19   Ct Angio Neck W Or Wo Contrast  Result Date: 01/25/2019 CLINICAL DATA:  Stroke follow-up. Acute left thalamic infarct on MRI. EXAM: CT ANGIOGRAPHY HEAD AND NECK TECHNIQUE: Multidetector CT imaging of the head and neck was performed using the standard protocol during bolus administration of intravenous contrast. Multiplanar CT image reconstructions and MIPs were obtained to evaluate the vascular anatomy. Carotid stenosis measurements (when applicable) are obtained  utilizing NASCET criteria, using the distal internal carotid diameter as the denominator. CONTRAST:  75mL OMNIPAQUE IOHEXOL 350 MG/ML SOLN COMPARISON:  Brain MRI 01/24/2019. Carotid Doppler ultrasound 01/25/2019. FINDINGS: CT HEAD FINDINGS Brain: An acute lacunar infarct is again noted near the ventral margin of the left thalamus. Patchy cerebral white matter hypodensities are nonspecific but compatible with moderately advanced chronic small vessel ischemic disease. Chronic lacunar infarcts are noted in the corona radiata bilaterally. No acute intracranial hemorrhage, mass, midline shift, or extra-axial fluid collection is identified. Vascular: As below. Skull: No fracture or focal osseous lesion. Sinuses: Paranasal sinuses and mastoid air cells are clear. Orbits: Unremarkable. Review of the MIP images confirms the above findings CTA NECK FINDINGS Aortic arch: Normal variant aortic arch branching pattern with common origin of the brachiocephalic and left common carotid arteries. Widely patent arch vessel origins. Right carotid system: Patent without evidence of stenosis or dissection. Tortuous proximal ICA. Left carotid system: Patent without evidence of stenosis or dissection. Vertebral arteries: Patent and strongly dominant left vertebral artery with mild atherosclerotic plaque at its origin not resulting in significant stenosis. Extremely diminutive but grossly patent right vertebral artery with assessment limited proximally by venous contrast. Skeleton: Advanced disc degeneration at C5-6 and C6-7 with asymmetric right neural foraminal stenosis at both levels due to uncovertebral spurring. Other neck: No evidence of acute abnormality or mass. Upper chest: Clear lung apices. Review of the MIP images confirms the above findings CTA HEAD FINDINGS Anterior circulation: The internal carotid arteries are widely patent from skull base to carotid termini. ACAs and MCAs are patent without evidence of proximal branch  occlusion. The right A1 segment is hypoplastic. There are bilateral distal M1 stenoses, mild on the right and moderate on the left. There is a severe mid left A2 stenosis, and there is ACA and MCA distal branch vessel irregularity bilaterally. No aneurysm is identified. Posterior circulation: Focal calcified plaque in the left V4 segment results in moderate to severe stenosis. The right V4 segment is diminutive with a possible severe stenosis versus short segment occlusion in its midportion though assessment is limited by the small size of the vessel and beam hardening at the skull base. Patent left PICA and bilateral SCA origins are identified. The basilar artery is patent without evidence of significant stenosis. There is a fetal origin of the left PCA. There are moderate right and severe left P2 stenoses with marked irregular narrowing and attenuation  of more distal PCA branch vessels bilaterally. No aneurysm is identified. Venous sinuses: Patent. Anatomic variants: Fetal left PCA.  Hypoplastic right A1. Delayed phase: No abnormal enhancement. Review of the MIP images confirms the above findings IMPRESSION: 1. Intracranial atherosclerosis including moderate right and severe left P2 stenoses, mild right and moderate left M1 stenoses, and severe left A2 stenosis. 2. Strongly dominant left vertebral artery with moderate to severe V4 stenosis. 3. Extremely diminutive right vertebral artery. 4. Widely patent carotid arteries. Electronically Signed   By: Sebastian Ache M.D.   On: 01/25/2019 14:09   Mr Brain Wo Contrast  Result Date: 01/24/2019 CLINICAL DATA:  Behavioral disturbance. Hallucinating. Symptoms developing over the last week. EXAM: MRI HEAD WITHOUT CONTRAST TECHNIQUE: Multiplanar, multiecho pulse sequences of the brain and surrounding structures were obtained without intravenous contrast. COMPARISON:  Head CT same day FINDINGS: Brain: Diffusion imaging shows a 1.2 cm acute infarction at the junction of the  left cerebral peduncle, anterior thalamus and radiating white matter tracts. No other acute infarction or other cause of restricted diffusion. There are multifocal white matter lesions affecting the cerebral hemispheric deep and subcortical white matter. One of the lesions in the left posterior frontal white matter shows fluid intensity material centrally and shows some T2 shine through on diffusion imaging along the margins of that. There is an old small vessel infarction in the right thalamus. There is an old small vessel infarction in the left pons with hemosiderin deposition. Certainly, in this patient with hypertension, small vessel infarctions could explain this entire picture. However, one could at least consider the possibility, though unlikely, of demyelinating disease in this case. Vascular: Major vessels at the base of the brain show flow. Skull and upper cervical spine: Negative Sinuses/Orbits: Clear/normal Other: None IMPRESSION: 1.2 cm acute infarction at the base of the brain on the left at the junction of the cerebral peduncle, anterior thalamus and radiating white matter tracts. Extensive old small vessel type infarctions elsewhere throughout the brain as outlined above. One could consider the possibility of demyelinating disease coexisting with small-vessel disease in this case, but that is less likely. Electronically Signed   By: Paulina Fusi M.D.   On: 01/24/2019 15:06   US Carotid Bilateral (at [facility] And [facility] Only)  Result Date: 01/25/2019 CLINICAL DATA:  63 year old male with new onset left basal ganglia lacunar infarcts. EXAM: BILATERAL CAROTID DUPLEX ULTRASOUND TECHNIQUE: Wallace Cullens scale imaging, color Doppler and duplex ultrasound were performed of bilateral carotid and vertebral arteries in the neck. COMPARISON:  Brain MRI 01/24/2019 FINDINGS: Criteria: Quantification of carotid stenosis is based on velocity parameters that correlate the residual internal carotid diameter with NASCET-based  stenosis levels, using the diameter of the distal internal carotid lumen as the denominator for stenosis measurement. The following velocity measurements were obtained: RIGHT ICA: 63/24 cm/sec CCA: 51/11 cm/sec SYSTOLIC ICA/CCA RATIO:  1.2 ECA:  67 cm/sec LEFT ICA: 59/21 cm/sec CCA: 70/17 cm/sec SYSTOLIC ICA/CCA RATIO:  0.8 ECA:  68 cm/sec RIGHT CAROTID ARTERY: Trace heterogeneous atherosclerotic plaque in the proximal internal carotid artery. By peak systolic velocity criteria, the estimated stenosis remains less than 50%. RIGHT VERTEBRAL ARTERY:  Patent with normal antegrade flow. LEFT CAROTID ARTERY: No significant atherosclerotic plaque or evidence of stenosis in the internal carotid artery. LEFT VERTEBRAL ARTERY:  Patent with normal antegrade flow. IMPRESSION: 1. Mild (1-49%) stenosis proximal right internal carotid artery secondary to trace heterogeneous atherosclerotic plaque. 2. No significant atherosclerotic plaque or evidence of stenosis in the left internal  carotid artery. 3. Vertebral arteries are patent with normal antegrade flow. Signed, Sterling Big, MD, RPVI [facility] Radiology Electronically Signed   By: Malachy Moan M.D.   On: 01/25/2019 09:13    Labs:  CBC: Recent Labs    01/24/19 1239 02/05/19 0641  WBC 9.0 9.7  HGB 15.5 16.9  HCT 43.1 47.5  PLT 224 237    COAGS: Recent Labs    02/05/19 0641  INR 1.0    BMP: Recent Labs    01/24/19 1239 01/25/19 0642 01/30/19 1440 02/05/19 0641  NA 139 135 138 135  K 3.6 3.9 4.6 3.8  CL 103 103 105 101  CO2 30 29 25 23   GLUCOSE 117* 113* 128* 131*  BUN 12 15 11 16   CALCIUM 9.0 8.5* 9.7 9.1  CREATININE 1.25* 1.17 1.33 1.19  GFRNONAA >60 >60  --  >60  GFRAA >60 >60  --  >60    LIVER FUNCTION TESTS: Recent Labs    01/24/19 1239 01/30/19 1440  BILITOT 1.0 0.7  AST 62* 61*  ALT 106* 126*  ALKPHOS 56 68  PROT 7.1 7.0  ALBUMIN 4.0 4.4    TUMOR  MARKERS: No results for input(s): AFPTM, CEA, CA199, CHROMGRNA in the last 8760 hours.  Assessment and Plan:  CVA 01/23/19 Left vertebral artery stenosis Now on ASA/Plavix Still with some Euphoria type symptoms Scheduled for cerebral arteriogram Risks and benefits of cerebral angiogram with intervention were discussed with the patient including, but not limited to bleeding, infection, vascular injury, contrast induced renal failure, stroke or even death.  This interventional procedure involves the use of X-rays and because of the nature of the planned procedure, it is possible that we will have prolonged use of X-ray fluoroscopy.  Potential radiation risks to you include (but are not limited to) the following: - A slightly elevated risk for cancer  several years later in life. This risk is typically less than 0.5% percent. This risk is low in comparison to the normal incidence of human cancer, which is 33% for women and 50% for men according to the [employer] Society. - Radiation induced injury can include skin redness, resembling a rash, tissue breakdown / ulcers and hair loss (which can be temporary or permanent).   The likelihood of either of these occurring depends on the difficulty of the procedure and whether you are sensitive to radiation due to previous procedures, disease, or genetic conditions.   IF your procedure requires a prolonged use of radiation, you will be notified and given written instructions for further action.  It is your responsibility to monitor the irradiated area for the 2 weeks following the procedure and to notify your physician if you are concerned that you have suffered a radiation induced injury.    All of the patient's questions were answered, patient is agreeable to proceed. Consent signed and in chart.  Thank you for this interesting consult.  I greatly enjoyed meeting Deloss Rathel and look forward to participating in their care.  A copy of  this report was sent to the requesting provider on this date.  Electronically Signed: Robet Leu, PA-C 02/05/2019, 7:41 AM   I spent a total of  30 Minutes   in face to face in clinical consultation, greater than 50% of which was counseling/coordinating care for cerebral arteriogram

## 2019-02-05 NOTE — Discharge Instructions (Signed)
Radial Site Care  This sheet gives you information about how to care for yourself after your procedure. Your health care provider may also give you more specific instructions. If you have problems or questions, contact your health care provider. What can I expect after the procedure? After the procedure, it is common to have:  Bruising and tenderness at the catheter insertion area. Follow these instructions at home: Medicines  Take over-the-counter and prescription medicines only as told by your health care provider. Insertion site care  Follow instructions from your health care provider about how to take care of your insertion site. Make sure you: ? Wash your hands with soap and water before you change your bandage (dressing). If soap and water are not available, use hand sanitizer. ? Remove your dressing as told by your health care provider. In 24-48 hours  Check your insertion site every day for signs of infection. Check for: ? Redness, swelling, or pain. ? Fluid or blood. ? Pus or a bad smell. ? Warmth.  Do not take baths, swim, or use a hot tub until your health care provider approves.  You may shower 24-48 hours after the procedure, or as directed by your health care provider. ? Remove the dressing and gently wash the site with plain soap and water. ? Pat the area dry with a clean towel. ? Do not rub the site. That could cause bleeding.  Do not apply powder or lotion to the site. Activity   For 24 hours after the procedure, or as directed by your health care provider: ? Do not flex or bend the affected arm. ? Do not push or pull heavy objects with the affected arm. ? Do not drive yourself home from the hospital or clinic. You may drive 24 hours after the procedure unless your health care provider tells you not to. ? Do not operate machinery or power tools.  Do not lift anything that is heavier than 10 lb (4.5 kg), or the limit that you are told, until your health care  provider says that it is safe. For 5 days  Ask your health care provider when it is okay to: ? Return to work or school. ? Resume usual physical activities or sports. ? Resume sexual activity. General instructions  If the catheter site starts to bleed, raise your arm and put firm pressure on the site. If the bleeding does not stop, get help right away. This is a medical emergency.  If you went home on the same day as your procedure, a responsible adult should be with you for the first 24 hours after you arrive home.  Keep all follow-up visits as told by your health care provider. This is important. Contact a health care provider if:  You have a fever.  You have redness, swelling, or yellow drainage around your insertion site. Get help right away if:  You have unusual pain at the radial site.  The catheter insertion area swells very fast.  The insertion area is bleeding, and the bleeding does not stop when you hold steady pressure on the area.  Your arm or hand becomes pale, cool, tingly, or numb. These symptoms may represent a serious problem that is an emergency. Do not wait to see if the symptoms will go away. Get medical help right away. Call your local emergency services (911 in the [place] Do not drive yourself to the hospital. Summary  After the procedure, it is common to have bruising and tenderness at the  site.  Follow instructions from your health care provider about how to take care of your radial site wound. Check the wound every day for signs of infection.  Do not lift anything that is heavier than 10 lb (4.5 kg), or the limit that you are told, until your health care provider says that it is safe. This information is not intended to replace advice given to you by your health care provider. Make sure you discuss any questions you have with your health care provider. Document Released: 12/31/2010 Document Revised: 01/03/2018 Document Reviewed: 01/03/2018 Elsevier  Interactive Patient Education  2019 Else[employer] Inc. Cerebral Angiogram, Care After This sheet gives you information about how to care for yourself after your procedure. Your health care provider may also give you more specific instructions. If you have problems or questions, contact your health care provider. What can I expect after the procedure? After your procedure, it is common to have:  Bruising and tenderness at the catheter insertion site.  A mild headache. Follow these instructions at home: Insertion site care   Follow instructions from your health care provider about how to take care of the insertion site. Make sure you: ? Wash your hands with soap and water before you change your bandage (dressing). If soap and water are not available, use hand sanitizer. ? Change your dressing as told by your health care provider. ? Leave stitches (sutures), skin glue, or adhesive strips in place. These skin closures may need to stay in place for 2 weeks or longer. If adhesive strip edges start to loosen and curl up, you may trim the loose edges. Do not remove adhesive strips completely unless your health care provider tells you to do that.  Do not apply powder or lotion to the site.  Do not take baths, swim, or use a hot tub until your health care provider says it is okay to do so.  You may shower 24-48 hours after the procedure or as told by your health care provider. ? Gently wash the site with plain soap and water. ? Pat the area dry with a clean towel. ? Do not rub the site. This may cause bleeding.  Check your incision area every day for signs of infection. Check for: ? Redness, swelling, or pain. ? Fluid or blood. ? Warmth. ? Pus or a bad smell. Activity  Rest as told by your health care provider.  Do not lift anything that is heavier than 10 lb (4.5 kg), or the limit that your health care provider tells you, until he or she says that it is safe.  Do not drive for 24 hours if  you were given a medicine to help you relax (sedative).  Do not drive or use heavy machinery while taking prescription pain medicine. General instructions   Return to your normal activities as told by your health care provider. Ask your health care provider what activities are safe for you.  If the catheter site starts to bleed, lie flat and put pressure on the site.  Drink enough fluid to keep your urine clear or pale yellow. This helps flush the contrast dye from your body.  Take over-the-counter and prescription medicines only as told by your health care provider.  Keep all follow-up visits as directed by your health care provider. This is important. Contact a health care provider if:  You have a fever or chills.  You have redness, swelling, or more pain around your insertion site.  You have fluid or  blood coming from your insertion site.  The insertion site feels warm to the touch.  You have pus or a bad smell coming from your insertion site.  You have more bruising around the insertion site.  Blood collects in the tissue around the catheter site (hematoma), and the hematoma may be painful to the touch. Get help right away if:  You have vision changes or loss of vision.  The catheter insertion area swells very fast.  You have numbness or weakness on one side of your body.  You have trouble talking, or you have slurred speech or cannot speak (aphasia).  You feel confused or have trouble remembering.  You have severe pain at the catheter insertion area.  The catheter insertion area is bleeding, and the bleeding does not stop after 30 minutes of holding steady pressure on the site.  The arm or leg where the catheter was inserted is numb, tingling, or cold.  You have chest pain.  You have trouble breathing.  You have a rash.  You have trouble using the arm or leg where the catheter was inserted. These symptoms may represent a serious problem that is an  emergency. Do not wait to see if the symptoms will go away. Get medical help right away. Call your local emergency services (911 in [place] Do not drive yourself to the hospital. Summary  After your procedure, it is common to have bruising and tenderness at the site where the catheter was inserted.  If your catheter insertion site begins to bleed, put direct pressure on it until the bleeding stops.  After your procedure, it is important to rest and drink plenty of fluids.  Return to your normal activities as told by your health care provider. Ask your health care provider what activities are safe for you. This information is not intended to replace advice given to you by your health care provider. Make sure you discuss any questions you have with your health care provider. Document Released: 04/14/2014 Document Revised: 01/02/2017 Document Reviewed: 01/02/2017 Elsevier Interactive Patient Education  2019 Elsevier Inc. Moderate Conscious Sedation, Adult, Care After These instructions provide you with information about caring for yourself after your procedure. Your health care provider may also give you more specific instructions. Your treatment has been planned according to current medical practices, but problems sometimes occur. Call your health care provider if you have any problems or questions after your procedure. What can I expect after the procedure? After your procedure, it is common:  To feel sleepy for several hours.  To feel clumsy and have poor balance for several hours.  To have poor judgment for several hours.  To vomit if you eat too soon. Follow these instructions at home: For at least 24 hours after the procedure:   Do not: ? Participate in activities where you could fall or become injured. ? Drive. ? Use heavy machinery. ? Drink alcohol. ? Take sleeping pills or medicines that cause drowsiness. ? Make important decisions or sign legal documents. ? Take care of  children on your own.  Rest. Eating and drinking  Follow the diet recommended by your health care provider.  If you vomit: ? Drink water, juice, or soup when you can drink without vomiting. ? Make sure you have little or no nausea before eating solid foods. General instructions  Have a responsible adult stay with you until you are awake and alert.  Take over-the-counter and prescription medicines only as told by your health care provider.  If you smoke, do not smoke without supervision.  Keep all follow-up visits as told by your health care provider. This is important. Contact a health care provider if:  You keep feeling nauseous or you keep vomiting.  You feel light-headed.  You develop a rash.  You have a fever. Get help right away if:  You have trouble breathing. This information is not intended to replace advice given to you by your health care provider. Make sure you discuss any questions you have with your health care provider. Document Released: 09/18/2013 Document Revised: 05/02/2016 Document Reviewed: 03/19/2016 Elsevier Interactive Patient Education  2019 [employer].

## 2019-02-05 NOTE — Sedation Documentation (Signed)
Nasal cannula/etCO2 removed per Dr. Corliss Skains request.

## 2019-02-07 ENCOUNTER — Encounter (HOSPITAL_COMMUNITY): Payer: Self-pay | Admitting: Interventional Radiology

## 2019-02-07 DIAGNOSIS — R0683 Snoring: Secondary | ICD-10-CM | POA: Diagnosis not present

## 2019-02-07 DIAGNOSIS — I10 Essential (primary) hypertension: Secondary | ICD-10-CM | POA: Diagnosis not present

## 2019-02-07 DIAGNOSIS — I672 Cerebral atherosclerosis: Secondary | ICD-10-CM | POA: Diagnosis not present

## 2019-02-07 DIAGNOSIS — I6340 Cerebral infarction due to embolism of unspecified cerebral artery: Secondary | ICD-10-CM | POA: Diagnosis not present

## 2019-02-12 DIAGNOSIS — I6340 Cerebral infarction due to embolism of unspecified cerebral artery: Secondary | ICD-10-CM | POA: Diagnosis not present

## 2019-02-12 DIAGNOSIS — I639 Cerebral infarction, unspecified: Secondary | ICD-10-CM | POA: Diagnosis not present

## 2019-02-12 DIAGNOSIS — I10 Essential (primary) hypertension: Secondary | ICD-10-CM | POA: Diagnosis not present

## 2019-02-19 ENCOUNTER — Other Ambulatory Visit: Payer: Self-pay | Admitting: Internal Medicine

## 2019-02-19 MED ORDER — ATORVASTATIN CALCIUM 40 MG PO TABS: 40.0000 mg | ORAL_TABLET | Freq: Every day | ORAL | 0 refills | Status: DC

## 2019-02-19 MED ORDER — HYDRALAZINE HCL 25 MG PO TABS: 25.0000 mg | ORAL_TABLET | Freq: Three times a day (TID) | ORAL | 0 refills | Status: DC

## 2019-02-19 NOTE — Telephone Encounter (Signed)
Requested medication (s) are due for refill today: yes  Requested medication (s) are on the active medication list: yes  Last refill:  01/25/19 to both Rx's  Future visit scheduled: yes   Notes to clinic:  Medications prescribed at discharge.    Requested Prescriptions  Pending Prescriptions Disp Refills   hydrALAZINE (APRESOLINE) 25 MG tablet 90 tablet 0    Sig: Take 1 tablet (25 mg total) by mouth every 8 (eight) hours.     Cardiovascular:  Vasodilators Passed - 02/19/2019 11:05 AM      Passed - HCT in normal range and within 360 days    HCT  Date Value Ref Range Status  02/05/2019 47.5 39.0 - 52.0 % Final         Passed - HGB in normal range and within 360 days    Hemoglobin  Date Value Ref Range Status  02/05/2019 16.9 13.0 - 17.0 g/dL Final         Passed - RBC in normal range and within 360 days    RBC  Date Value Ref Range Status  02/05/2019 5.00 4.22 - 5.81 MIL/uL Final         Passed - WBC in normal range and within 360 days    WBC  Date Value Ref Range Status  02/05/2019 9.7 4.0 - 10.5 K/uL Final         Passed - PLT in normal range and within 360 days    Platelets  Date Value Ref Range Status  02/05/2019 237 150 - 400 K/uL Final         Passed - Last BP in normal range    BP Readings from Last 1 Encounters:  02/05/19 131/74         Passed - Valid encounter within last 12 months    Recent Outpatient Visits          2 weeks ago Cerebrovascular accident (CVA), unspecified mechanism (HCC)   [facility] McLean-Scocuzza, Pasty Spillers, MD   3 years ago Hypertensive urgency   [facility], [place], DO      Future Appointments            In 1 week McLean-Scocuzza, Pasty Spillers, MD [facility]          atorvastatin (LIPITOR) 40 MG tablet 30 tablet 0    Sig: Take 1 tablet (40 mg total) by mouth daily at 6 PM.     Cardiovascular:  Antilipid - Statins Failed - 02/19/2019 11:05 AM     Failed - HDL in normal range and within 360 days    HDL  Date Value Ref Range Status  01/25/2019 30 (L) >40 mg/dL Final         Failed - Triglycerides in normal range and within 360 days    Triglycerides  Date Value Ref Range Status  01/25/2019 266 (H) <150 mg/dL Final         Passed - Total Cholesterol in normal range and within 360 days    Cholesterol  Date Value Ref Range Status  01/25/2019 145 0 - 200 mg/dL Final         Passed - LDL in normal range and within 360 days    LDL Cholesterol  Date Value Ref Range Status  01/25/2019 62 0 - 99 mg/dL Final    Comment:           Total Cholesterol/HDL:CHD Risk Coronary Heart Disease Risk  Table                     Men   Women  1/2 Average Risk   3.4   3.3  Average Risk       5.0   4.4  2 X Average Risk   9.6   7.1  3 X Average Risk  23.4   11.0        Use the calculated Patient Ratio above and the CHD Risk Table to determine the patient's CHD Risk.        ATP III CLASSIFICATION (LDL):  <100     mg/dL   Optimal  219-758  mg/dL   Near or Above                    Optimal  130-159  mg/dL   Borderline  832-549  mg/dL   High  >826     mg/dL   Very High Performed at [facility], [address], [place], [place] [address]          Passed - Patient is not pregnant      Passed - Valid encounter within last 12 months    Recent Outpatient Visits          2 weeks ago Cerebrovascular accident (CVA), unspecified mechanism (HCC)   [facility] McLean-Scocuzza, Pasty Spillers, MD   3 years ago Hypertensive urgency   [facility], Verdis [place], [place]      Future Appointments            In 1 week McLean-Scocuzza, Pasty Spillers, MD [facility], [facility]

## 2019-02-19 NOTE — Telephone Encounter (Signed)
Copied from CRM [id number]. Topic: Quick Communication - Rx Refill/Question >> Feb 19, 2019 10:59 AM Frances Furbish L wrote: Medication:hydrALAZINE (APRESOLINE) 25 MG tablet [[id number]] atorvastatin (LIPITOR) 40 MG tablet [[id number]]   Has the patient contacted their pharmacy? No  Preferred Pharmacy (with phone number or street name):[phone], [place] - [address]     [phone] (Phone) [phone] (Fax)

## 2019-02-22 ENCOUNTER — Other Ambulatory Visit: Payer: Self-pay | Admitting: Internal Medicine

## 2019-02-22 NOTE — Telephone Encounter (Signed)
Requested medication (s) are due for refill today: yes to all 3   Requested medication (s) are on the active medication list: yes  Last refill:  01/25/19 for all three medications  Future visit scheduled: yes 6 days  Notes to clinic:  Pt was prescribed these medications at discharge form hospital   Requested Prescriptions  Pending Prescriptions Disp Refills   amLODipine (NORVASC) 5 MG tablet 30 tablet 0    Sig: Take 1 tablet (5 mg total) by mouth daily.     Cardiovascular:  Calcium Channel Blockers Passed - 02/22/2019  9:44 AM      Passed - Last BP in normal range    BP Readings from Last 1 Encounters:  02/05/19 131/74         Passed - Valid encounter within last 6 months    Recent Outpatient Visits          3 weeks ago Cerebrovascular accident (CVA), unspecified mechanism (HCC)   [facility] [place] McLean-Scocuzza, Pasty Spillers, MD   3 years ago Hypertensive urgency   [facility] [place], [place], [place]      Future Appointments            In 6 days McLean-Scocuzza, Pasty Spillers, MD [facility]          metoprolol tartrate (LOPRESSOR) 25 MG tablet 60 tablet 0    Sig: Take 1 tablet (25 mg total) by mouth 2 (two) times daily.     Cardiovascular:  Beta Blockers Passed - 02/22/2019  9:44 AM      Passed - Last BP in normal range    BP Readings from Last 1 Encounters:  02/05/19 131/74         Passed - Last Heart Rate in normal range    Pulse Readings from Last 1 Encounters:  02/05/19 74         Passed - Valid encounter within last 6 months    Recent Outpatient Visits          3 weeks ago Cerebrovascular accident (CVA), unspecified mechanism (HCC)   [facility] McLean-Scocuzza, Pasty Spillers, MD   3 years ago Hypertensive urgency   [facility], [place], [place]      Future Appointments            In 6 days McLean-Scocuzza, Pasty Spillers, MD [facility]          clopidogrel (PLAVIX) 75 MG tablet 30 tablet 0    Sig: Take 1 tablet (75 mg total) by mouth daily.     Hematology: Antiplatelets - clopidogrel Failed - 02/22/2019  9:44 AM      Failed - Evaluate AST, ALT within 2 months of therapy initiation.      Failed - ALT in normal range and within 360 days    ALT  Date Value Ref Range Status  01/30/2019 126 (H) 0 - 53 U/L Final         Failed - AST in normal range and within 360 days    AST  Date Value Ref Range Status  01/30/2019 61 (H) 0 - 37 U/L Final         Passed - HCT in normal range and within 180 days    HCT  Date Value Ref Range Status  02/05/2019 47.5 39.0 - 52.0 % Final         Passed - HGB in normal range  and within 180 days    Hemoglobin  Date Value Ref Range Status  02/05/2019 16.9 13.0 - 17.0 g/dL Final         Passed - PLT in normal range and within 180 days    Platelets  Date Value Ref Range Status  02/05/2019 237 150 - 400 K/uL Final         Passed - Valid encounter within last 6 months    Recent Outpatient Visits          3 weeks ago Cerebrovascular accident (CVA), unspecified mechanism (HCC)   [facility] McLean-Scocuzza, Pasty Spillers, MD   3 years ago Hypertensive urgency   [facility] [place], [place], DO      Future Appointments            In 6 days McLean-Scocuzza, Pasty Spillers, MD [facility], [facility]

## 2019-02-22 NOTE — Telephone Encounter (Signed)
Copied from CRM [id number]. Topic: Quick Communication - Rx Refill/Question >> Feb 22, 2019  9:21 AM Jens Som A wrote: Medication: metoprolol tartrate (LOPRESSOR) 25 MG tablet [[id number]] , clopidogrel (PLAVIX) 75 MG tablet [[id number]] , amLODipine (NORVASC) 5 MG tablet [[id number]] ,   Has the patient contacted their pharmacy? Yes (Agent: If no, request that the patient contact the pharmacy for the refill.) (Agent: If yes, when and what did the pharmacy advise?)  Preferred Pharmacy (with phone number [facility] street name): [facility] [address] [place] - [address] [phone] (Phone) [phone] (Fax)    Agent: Please be advised that RX refills may take up to 3 business days. We ask that you follow-up with your pharmacy.

## 2019-02-25 ENCOUNTER — Other Ambulatory Visit: Payer: Self-pay | Admitting: Internal Medicine

## 2019-02-25 DIAGNOSIS — I10 Essential (primary) hypertension: Secondary | ICD-10-CM

## 2019-02-25 MED ORDER — OLMESARTAN MEDOXOMIL-HCTZ 20-12.5 MG PO TABS: 1.0000 | ORAL_TABLET | Freq: Every day | ORAL | 1 refills | Status: DC

## 2019-02-25 MED ORDER — ATORVASTATIN CALCIUM 40 MG PO TABS: 40.0000 mg | ORAL_TABLET | Freq: Every day | ORAL | 3 refills | Status: DC

## 2019-02-25 MED ORDER — METOPROLOL TARTRATE 25 MG PO TABS: 25.0000 mg | ORAL_TABLET | Freq: Two times a day (BID) | ORAL | 1 refills | Status: DC

## 2019-02-25 MED ORDER — HYDRALAZINE HCL 25 MG PO TABS: 25.0000 mg | ORAL_TABLET | Freq: Three times a day (TID) | ORAL | 1 refills | Status: DC

## 2019-02-25 MED ORDER — CLOPIDOGREL BISULFATE 75 MG PO TABS: 75.0000 mg | ORAL_TABLET | Freq: Every day | ORAL | 1 refills | Status: DC

## 2019-02-25 MED ORDER — AMLODIPINE BESYLATE 5 MG PO TABS: 5.0000 mg | ORAL_TABLET | Freq: Every day | ORAL | 1 refills | Status: DC

## 2019-02-26 ENCOUNTER — Encounter: Payer: Self-pay | Admitting: Internal Medicine

## 2019-02-26 DIAGNOSIS — I6340 Cerebral infarction due to embolism of unspecified cerebral artery: Secondary | ICD-10-CM | POA: Diagnosis not present

## 2019-02-26 DIAGNOSIS — I10 Essential (primary) hypertension: Secondary | ICD-10-CM | POA: Diagnosis not present

## 2019-02-28 ENCOUNTER — Ambulatory Visit: Payer: BLUE CROSS/BLUE SHIELD | Admitting: Internal Medicine

## 2019-02-28 ENCOUNTER — Other Ambulatory Visit: Payer: Self-pay

## 2019-02-28 ENCOUNTER — Encounter: Payer: Self-pay | Admitting: Internal Medicine

## 2019-02-28 VITALS — BP 144/78 | HR 74 | Temp 98.5°F | Ht 67.0 in | Wt 214.0 lb

## 2019-02-28 DIAGNOSIS — R7303 Prediabetes: Secondary | ICD-10-CM

## 2019-02-28 DIAGNOSIS — I639 Cerebral infarction, unspecified: Secondary | ICD-10-CM | POA: Diagnosis not present

## 2019-02-28 DIAGNOSIS — E785 Hyperlipidemia, unspecified: Secondary | ICD-10-CM

## 2019-02-28 DIAGNOSIS — R748 Abnormal levels of other serum enzymes: Secondary | ICD-10-CM

## 2019-02-28 DIAGNOSIS — I10 Essential (primary) hypertension: Secondary | ICD-10-CM | POA: Diagnosis not present

## 2019-02-28 DIAGNOSIS — G4700 Insomnia, unspecified: Secondary | ICD-10-CM | POA: Diagnosis not present

## 2019-02-28 DIAGNOSIS — L299 Pruritus, unspecified: Secondary | ICD-10-CM

## 2019-02-28 MED ORDER — NEOMYCIN-POLYMYXIN-HC 3.5-10000-1 OT SOLN: 4.0000 [drp] | Freq: Four times a day (QID) | OTIC | 0 refills | Status: DC

## 2019-02-28 MED ORDER — OLMESARTAN MEDOXOMIL-HCTZ 40-25 MG PO TABS: 1.0000 | ORAL_TABLET | Freq: Every day | ORAL | 3 refills | Status: DC

## 2019-02-28 NOTE — Progress Notes (Addendum)
Chief Complaint  Patient presents with  . Follow-up   F/u with wife today  1. HTN  norvasc 5 mg, hydralazine 25 tid, lopressor 25 mg bid, benicar 20-12.5 mg qd BP still not at goal today 144/78 2. Stroke, multiple wife c/w change in behavior and finding etoh bottles though pt denies drinking and behavior change after stroke  -wife ask about driving test after stroke will given them information about this  3. 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 Elevated lfts will f/u  5. C/o Left ear itching    Review of Systems  Constitutional: Negative for weight loss.  HENT: Negative for hearing loss.   Eyes: Negative for blurred vision.  Respiratory: Negative for shortness of breath.   Cardiovascular: Negative for chest pain.  Gastrointestinal: Negative for abdominal pain.  Skin: Negative for rash.  Neurological: Negative for headaches.  Psychiatric/Behavioral:       C/w etoh abuse  +behavior changes s/p stroke     Past Medical History:  Diagnosis Date  . High blood pressure   . Hyperlipidemia   . Memory loss   . Prediabetes   . Stroke (cerebrum) (HCC)   . Stroke [facility])    Past Surgical History:  Procedure Laterality Date  . COLONOSCOPY WITH PROPOFOL N/A 10/05/2015   Procedure: COLONOSCOPY WITH PROPOFOL;  Surgeon: Scot Jun, MD;  Location: [facility] ENDOSCOPY;  Service: Endoscopy;  Laterality: N/A;  . IR ANGIO INTRA EXTRACRAN SEL COM CAROTID INNOMINATE BILAT MOD SED  02/05/2019  . IR ANGIO VERTEBRAL SEL SUBCLAVIAN INNOMINATE UNI R MOD SED  02/05/2019  . IR ANGIO VERTEBRAL SEL VERTEBRAL UNI L MOD SED  02/05/2019  . IR US GUIDE VASC ACCESS RIGHT  02/05/2019   Family History  Problem Relation Age of Onset  . Lung cancer Father   . Dementia Mother    Social History   Socioeconomic History  . Marital status: Married    Spouse name: Not on file  . Number of children: Not on file  . Years of education: Not on file  . Highest education level:  Not on file  Occupational History    Employer: [employer]  Social Needs  . Financial resource strain: Not on file  . Food insecurity:    Worry: Not on file    Inability: Not on file  . Transportation needs:    Medical: Not on file    Non-medical: Not on file  Tobacco Use  . Smoking status: Never Smoker  . Smokeless tobacco: Never Used  Substance and Sexual Activity  . Alcohol use: Yes    Comment: ?amt   . Drug use: No  . Sexual activity: Yes  Lifestyle  . Physical activity:    Days per week: Not on file    Minutes per session: Not on file  . Stress: Not on file  Relationships  . Social connections:    Talks on phone: Not on file    Gets together: Not on file    Attends religious service: Not on file    Active member of club or organization: Not on file    Attends meetings of clubs or organizations: Not on file    Relationship status: Not on file  . Intimate partner violence:    Fear of current or ex partner: Not on file    Emotionally abused: Not on file    Physically abused: Not on file    Forced sexual activity: Not on file  Other  Topics Concern  . Not on file  Social History Narrative   Married    Kids    Current Meds  Medication Sig  . acetaminophen (TYLENOL) 500 MG tablet Take 500 mg by mouth daily as needed for moderate pain or headache.  Marland Kitchen amLODipine (NORVASC) 5 MG tablet Take 1 tablet (5 mg total) by mouth daily.  Marland Kitchen aspirin EC 81 MG tablet Take 81 mg by mouth daily.  Marland Kitchen atorvastatin (LIPITOR) 40 MG tablet Take 1 tablet (40 mg total) by mouth daily at 6 PM.  . calcium carbonate (TUMS - DOSED IN MG ELEMENTAL CALCIUM) 500 MG chewable tablet Chew 1 tablet by mouth daily as needed for indigestion or heartburn.  . Cholecalciferol 1.25 MG (50000 UT) capsule Take 1 capsule (50,000 Units total) by mouth once a week. (Patient taking differently: Take 50,000 Units by mouth every Sunday. )  . clopidogrel (PLAVIX) 75 MG tablet Take 1 tablet (75 mg total) by mouth daily.  .  hydrALAZINE (APRESOLINE) 25 MG tablet Take 1 tablet (25 mg total) by mouth every 8 (eight) hours.  . Melatonin 5 MG CAPS Take 5 mg by mouth at bedtime as needed (sleep).  . metoprolol tartrate (LOPRESSOR) 25 MG tablet Take 1 tablet (25 mg total) by mouth 2 (two) times daily.  . Multiple Vitamin (MULTI-VITAMIN DAILY) TABS Take 1 tablet by mouth daily.  Marland Kitchen omega-3 acid ethyl esters (LOVAZA) 1 g capsule Take 1 g by mouth daily.  Marland Kitchen senna-docusate (SENOKOT-S) 8.6-50 MG tablet Take 1 tablet by mouth at bedtime as needed for mild constipation.  . Tetrahydrozoline HCl (VISINE OP) Place 1 drop into both eyes daily as needed (dry eyes).  . [DISCONTINUED] olmesartan-hydrochlorothiazide (BENICAR HCT) 20-12.5 MG tablet Take 1 tablet by mouth daily. In am   No Known Allergies Recent Results (from the past 2160 hour(s))  Glucose, capillary     Status: Abnormal   Collection Time: 01/24/19 11:59 AM  Result Value Ref Range   Glucose-Capillary 132 (H) 70 - 99 mg/dL  CBC     Status: Abnormal   Collection Time: 01/24/19 12:39 PM  Result Value Ref Range   WBC 9.0 4.0 - 10.5 K/uL   RBC 4.55 4.22 - 5.81 MIL/uL   Hemoglobin 15.5 13.0 - 17.0 g/dL   HCT 21.3 08.6 - 57.8 %   MCV 94.7 80.0 - 100.0 fL   MCH 34.1 (H) 26.0 - 34.0 pg   MCHC 36.0 30.0 - 36.0 g/dL   RDW 46.9 62.9 - 52.8 %   Platelets 224 150 - 400 K/uL   nRBC 0.0 0.0 - 0.2 %    Comment: Performed at [facility], [address] [place], [place] [address]  Comprehensive metabolic panel     Status: Abnormal   Collection Time: 01/24/19 12:39 PM  Result Value Ref Range   Sodium 139 135 - 145 mmol/L   Potassium 3.6 3.5 - 5.1 mmol/L   Chloride 103 98 - 111 mmol/L   CO2 30 22 - 32 mmol/L   Glucose, Bld 117 (H) 70 - 99 mg/dL   BUN 12 8 - 23 mg/dL   Creatinine, Ser 4.01 (H) 0.61 - 1.24 mg/dL   Calcium 9.0 8.9 - 02.7 mg/dL   Total Protein 7.1 6.5 - 8.1 g/dL   Albumin 4.0 3.5 - 5.0 g/dL   AST 62 (H) 15 - 41 U/L   ALT 106 (H) 0 - 44 U/L    Alkaline Phosphatase 56 38 - 126 U/L  Total Bilirubin 1.0 0.3 - 1.2 mg/dL   GFR calc non Af Amer >60 >60 mL/min   GFR calc Af Amer >60 >60 mL/min   Anion gap 6 5 - 15    Comment: Performed at [facility], [address] [place], [place] [address]  Ethanol     Status: None   Collection Time: 01/24/19 12:39 PM  Result Value Ref Range   Alcohol, Ethyl (B) <10 <10 mg/dL    Comment: (NOTE) Lowest detectable limit for serum alcohol is 10 mg/dL. For medical purposes only. Performed at [facility], [address], [place], [place] [address]   Urine Drug Screen, Qualitative [facility] only)     Status: None   Collection Time: 01/24/19  1:31 PM  Result Value Ref Range   Tricyclic, Ur Screen NONE DETECTED NONE DETECTED   Amphetamines, Ur Screen NONE DETECTED NONE DETECTED   MDMA (Ecstasy)Ur Screen NONE DETECTED NONE DETECTED   Cocaine Metabolite,Ur [place] NONE DETECTED NONE DETECTED   Opiate, Ur Screen NONE DETECTED NONE DETECTED   Phencyclidine (PCP) Ur S NONE DETECTED NONE DETECTED   Cannabinoid 50 Ng, Ur [place] NONE DETECTED NONE DETECTED   Barbiturates, Ur Screen NONE DETECTED NONE DETECTED   Benzodiazepine, Ur Scrn NONE DETECTED NONE DETECTED   Methadone Scn, Ur NONE DETECTED NONE DETECTED    Comment: (NOTE) Tricyclics + metabolites, urine    Cutoff 1000 ng/mL Amphetamines + metabolites, urine  Cutoff 1000 ng/mL MDMA (Ecstasy), urine              Cutoff 500 ng/mL Cocaine Metabolite, urine          Cutoff 300 ng/mL Opiate + metabolites, urine        Cutoff 300 ng/mL Phencyclidine (PCP), urine         Cutoff 25 ng/mL Cannabinoid, urine                 Cutoff 50 ng/mL Barbiturates + metabolites, urine  Cutoff 200 ng/mL Benzodiazepine, urine              Cutoff 200 ng/mL Methadone, urine                   Cutoff 300 ng/mL The urine drug screen provides only a preliminary, unconfirmed analytical test result and should not be used for non-medical purposes. Clinical  consideration and professional judgment should be applied to any positive drug screen result due to possible interfering substances. A more specific alternate chemical method must be used in order to obtain a confirmed analytical result. Gas chromatography / mass spectrometry (GC/MS) is the preferred confirmat ory method. Performed at [facility], [address] [place], [place] [address]   Urinalysis, Complete w Microscopic     Status: Abnormal   Collection Time: 01/24/19  1:31 PM  Result Value Ref Range   Color, Urine YELLOW (A) YELLOW   APPearance CLEAR (A) CLEAR   Specific Gravity, Urine 1.012 1.005 - 1.030   pH 6.0 5.0 - 8.0   Glucose, UA NEGATIVE NEGATIVE mg/dL   Hgb urine dipstick NEGATIVE NEGATIVE   Bilirubin Urine NEGATIVE NEGATIVE   Ketones, ur NEGATIVE NEGATIVE mg/dL   Protein, ur NEGATIVE NEGATIVE mg/dL   Nitrite NEGATIVE NEGATIVE   Leukocytes,Ua NEGATIVE NEGATIVE   WBC, UA 0-5 0 - 5 WBC/hpf   Bacteria, UA NONE SEEN NONE SEEN   Squamous Epithelial / LPF NONE SEEN 0 - 5   Mucus PRESENT    Hyaline Casts, UA  PRESENT     Comment: Performed at [facility], [address] [place], [place] [address]  Hemoglobin A1c     Status: Abnormal   Collection Time: 01/25/19  6:39 AM  Result Value Ref Range   Hgb A1c MFr Bld 5.8 (H) 4.8 - 5.6 %    Comment: (NOTE) Pre diabetes:          5.7%-6.4% Diabetes:              >6.4% Glycemic control for   <7.0% adults with diabetes    Mean Plasma Glucose 119.76 mg/dL    Comment: Performed at [facility], [address] [place], [place] [address]  HIV antibody (Routine Testing)     Status: None   Collection Time: 01/25/19  6:42 AM  Result Value Ref Range   HIV Screen 4th Generation wRfx Non Reactive Non Reactive    Comment: (NOTE) Performed At: [facility] [address] [place], [place] [id number] Jolene Schimke MD WG:[id number]   Lipid panel     Status: Abnormal   Collection Time: 01/25/19   6:42 AM  Result Value Ref Range   Cholesterol 145 0 - 200 mg/dL   Triglycerides 784 (H) <150 mg/dL   HDL 30 (L) >69 mg/dL   Total CHOL/HDL Ratio 4.8 RATIO   VLDL 53 (H) 0 - 40 mg/dL   LDL Cholesterol 62 0 - 99 mg/dL    Comment:        Total Cholesterol/HDL:CHD Risk Coronary Heart Disease Risk Table                     Men   Women  1/2 Average Risk   3.4   3.3  Average Risk       5.0   4.4  2 X Average Risk   9.6   7.1  3 X Average Risk  23.4   11.0        Use the calculated Patient Ratio above and the CHD Risk Table to determine the patient's CHD Risk.        ATP III CLASSIFICATION (LDL):  <100     mg/dL   Optimal  629-528  mg/dL   Near or Above                    Optimal  130-159  mg/dL   Borderline  413-244  mg/dL   High  >010     mg/dL   Very High Performed at [facility], [address] [place], [place] [address]   Basic metabolic panel     Status: Abnormal   Collection Time: 01/25/19  6:42 AM  Result Value Ref Range   Sodium 135 135 - 145 mmol/L   Potassium 3.9 3.5 - 5.1 mmol/L   Chloride 103 98 - 111 mmol/L   CO2 29 22 - 32 mmol/L   Glucose, Bld 113 (H) 70 - 99 mg/dL   BUN 15 8 - 23 mg/dL   Creatinine, Ser 6.64 0.61 - 1.24 mg/dL   Calcium 8.5 (L) 8.9 - 10.3 mg/dL   GFR calc non Af Amer >60 >60 mL/min   GFR calc Af Amer >60 >60 mL/min   Anion gap 3 (L) 5 - 15    Comment: Performed at [facility], [address], [place], [place] [address]  ECHOCARDIOGRAM COMPLETE     Status: None   Collection Time: 01/25/19  9:56 AM  Result Value Ref Range  Weight 3,255.75 oz   Height 67 in   BP 176/98 mmHg  Comprehensive metabolic panel     Status: Abnormal   Collection Time: 01/30/19  2:40 PM  Result Value Ref Range   Sodium 138 135 - 145 mEq/L   Potassium 4.6 3.5 - 5.1 mEq/L   Chloride 105 96 - 112 mEq/L   CO2 25 19 - 32 mEq/L   Glucose, Bld 128 (H) 70 - 99 mg/dL   BUN 11 6 - 23 mg/dL   Creatinine, Ser 9.70 0.40 - 1.50 mg/dL   Total  Bilirubin 0.7 0.2 - 1.2 mg/dL   Alkaline Phosphatase 68 39 - 117 U/L   AST 61 (H) 0 - 37 U/L   ALT 126 (H) 0 - 53 U/L   Total Protein 7.0 6.0 - 8.3 g/dL   Albumin 4.4 3.5 - 5.2 g/dL   Calcium 9.7 8.4 - 26.3 mg/dL   GFR 78.58 (L) >85.02 mL/min  PSA     Status: None   Collection Time: 01/30/19  2:40 PM  Result Value Ref Range   PSA 1.42 0.10 - 4.00 ng/mL    Comment: Test performed using Access Hybritech PSA Assay, a parmagnetic partical, chemiluminecent immunoassay.  TSH     Status: None   Collection Time: 01/30/19  2:40 PM  Result Value Ref Range   TSH 1.71 0.35 - 4.50 uIU/mL  Vitamin D (25 hydroxy)     Status: Abnormal   Collection Time: 01/30/19  2:40 PM  Result Value Ref Range   VITD 17.61 (L) 30.00 - 100.00 ng/mL  Hepatitis A Ab, Total     Status: None   Collection Time: 01/30/19  2:40 PM  Result Value Ref Range   Hepatitis A AB,Total NON-REACTIVE NON-REACTI    Comment: For additional information, please refer to  http://education.[facility].com/faq/FAQ202  (This link is being provided for informational/ educational purposes only.)   Hepatitis B surface antibody,quantitative     Status: Abnormal   Collection Time: 01/30/19  2:40 PM  Result Value Ref Range   Hepatitis B-Post <5 (L) > OR = 10 mIU/mL    Comment: . Patient does not have immunity to hepatitis B virus. . For additional information, please refer to http://education.[facility].com/faq/FAQ105 (This link is being provided for informational/ educational purposes only).   Hepatitis B surface antigen     Status: None   Collection Time: 01/30/19  2:40 PM  Result Value Ref Range   Hepatitis B Surface Ag NON-REACTIVE NON-REACTI  Vitamin B12     Status: None   Collection Time: 01/30/19  2:40 PM  Result Value Ref Range   Vitamin B-12 421 211 - 911 pg/mL  Basic metabolic panel     Status: Abnormal   Collection Time: 02/05/19  6:41 AM  Result Value Ref Range   Sodium 135 135 - 145 mmol/L   Potassium  3.8 3.5 - 5.1 mmol/L   Chloride 101 98 - 111 mmol/L   CO2 23 22 - 32 mmol/L   Glucose, Bld 131 (H) 70 - 99 mg/dL   BUN 16 8 - 23 mg/dL   Creatinine, Ser 7.74 0.61 - 1.24 mg/dL   Calcium 9.1 8.9 - 12.8 mg/dL   GFR calc non Af Amer >60 >60 mL/min   GFR calc Af Amer >60 >60 mL/min   Anion gap 11 5 - 15    Comment: Performed at [facility], [address], [place], [place] [address]  CBC     Status: None  Collection Time: 02/05/19  6:41 AM  Result Value Ref Range   WBC 9.7 4.0 - 10.5 K/uL   RBC 5.00 4.22 - 5.81 MIL/uL   Hemoglobin 16.9 13.0 - 17.0 g/dL   HCT 96.2 95.2 - 84.1 %   MCV 95.0 80.0 - 100.0 fL   MCH 33.8 26.0 - 34.0 pg   MCHC 35.6 30.0 - 36.0 g/dL   RDW 32.4 40.1 - 02.7 %   Platelets 237 150 - 400 K/uL   nRBC 0.0 0.0 - 0.2 %    Comment: Performed at [facility], [address], [place], [place] [address]  Protime-INR     Status: None   Collection Time: 02/05/19  6:41 AM  Result Value Ref Range   Prothrombin Time 13.3 11.4 - 15.2 seconds   INR 1.0 0.8 - 1.2    Comment: Performed at [facility], [address] [place], [place] [address]   Objective  Body mass index is 33.52 kg/m. Wt Readings from Last 3 Encounters:  02/28/19 214 lb (97.1 kg)  02/05/19 205 lb (93 kg)  01/30/19 204 lb 3.2 oz (92.6 kg)   Temp Readings from Last 3 Encounters:  02/28/19 98.5 F (36.9 C) (Oral)  02/05/19 98.1 F (36.7 C) (Oral)  01/30/19 98.1 F (36.7 C) (Oral)   BP Readings from Last 3 Encounters:  02/28/19 (!) 144/78  02/05/19 131/74  01/30/19 (!) 150/74   Pulse Readings from Last 3 Encounters:  02/28/19 74  02/05/19 74  01/30/19 73    Physical Exam Vitals signs and nursing note reviewed.  Constitutional:      Appearance: Normal appearance. He is well-developed and well-groomed.  HENT:     Head: Normocephalic and atraumatic.     Nose: Nose normal.     Mouth/Throat:     Mouth: Mucous membranes are moist.     Pharynx: Oropharynx is clear.   Eyes:     Conjunctiva/sclera: Conjunctivae normal.     Pupils: Pupils are equal, round, and reactive to light.  Cardiovascular:     Rate and Rhythm: Normal rate and regular rhythm.     Heart sounds: Normal heart sounds.  Pulmonary:     Effort: Pulmonary effort is normal.     Breath sounds: Normal breath sounds.  Skin:    General: Skin is warm and dry.  Neurological:     General: No focal deficit present.     Mental Status: He is alert and oriented to person, place, and time. Mental status is at baseline.     Gait: Gait normal.  Psychiatric:        Attention and Perception: He is inattentive.        Mood and Affect: Mood and affect normal.        Speech: Speech normal.        Behavior: Behavior normal. Behavior is cooperative.        Thought Content: Thought content normal.        Cognition and Memory: Cognition and memory normal.        Judgment: Judgment normal.     Assessment   1. HTN 2. Stroke, multiple  3. 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 Elevated lfts  5.HM 6. Left ear itching  Plan   1.  incre benicar 40-25 mg qd  Cont other meds  Given list high K foods  2.  F/u neurology/cards Linq monitor monitor  Sleep study 03/2019 home  Neurology appt 03/08/19  Disc DMV driving test can call to schedule  Due to behavior change referred to psych for further management rec neuro and psych  Do depression screen at f/u  3.  Disc otc meds melatonin Consider therapy  Reduce alcohol  4. [place] pending  Reduce etoh  5.  Flu shot utd  tdap utd 06/18/15 Consider shingrix in future  Consider hep A/B   Colonoscopy 10/05/15 Dr. Markham Jordan + polyps bx neg repeat in 10 years  PSA today needs DRE in future  6. Neomycin ear drops L ear  Provider: Dr. French Ana McLean-Scocuzza-Internal Medicine

## 2019-02-28 NOTE — Progress Notes (Signed)
Pre visit review using our clinic review tool, if applicable. No additional management support is needed unless otherwise documented below in the visit note. 

## 2019-02-28 NOTE — Patient Instructions (Addendum)
Consider occupational therapy for handwriting   DMV for driving test   Vitamin D 8563 IU daily start month 7 over the counter after weekly x 6 months  Chamomile tea, warm milk, melatonin 3-6 mg at night   Nonalcoholic Fatty Liver Disease Diet Nonalcoholic fatty liver disease is a condition that causes fat to accumulate in and around the liver. The disease makes it harder for the liver to work the way that it should. Following a healthy diet can help to keep nonalcoholic fatty liver disease under control. It can also help to prevent or improve conditions that are associated with the disease, such as heart disease, diabetes, high blood pressure, and abnormal cholesterol levels. Along with regular exercise, this diet:  Promotes weight loss.  Helps to control blood sugar levels.  Helps to improve the way that the body uses insulin. What do I need to know about this diet?  Use the glycemic index (GI) to plan your meals. The index tells you how quickly a food will raise your blood sugar. Choose low-GI foods. These foods take a longer time to raise blood sugar.  Keep track of how many calories you take in. Eating the right amount of calories will help you to achieve a healthy weight.  You may want to follow a Mediterranean diet. This diet includes a lot of vegetables, lean meats or fish, whole grains, fruits, and healthy oils and fats. What foods can I eat? Grains Whole grains, such as whole-wheat or whole-grain breads, crackers, tortillas, cereals, and pasta. Stone-ground whole wheat. Pumpernickel bread. Unsweetened oatmeal. Bulgur. Barley. Quinoa. Brown or wild rice. Corn or whole-wheat flour tortillas. Vegetables Lettuce. Spinach. Peas. Beets. Cauliflower. Cabbage. Broccoli. Carrots. Tomatoes. Squash. Eggplant. Herbs. Peppers. Onions. Cucumbers. Brussels sprouts. Yams and sweet potatoes. Beans. Lentils. Fruits Bananas. Apples. Oranges. Grapes. Papaya. Mango. Pomegranate. Kiwi. Grapefruit.  Cherries. Meats and Other Protein Sources Seafood and shellfish. Lean meats. Poultry. Tofu. Dairy Low-fat or fat-free dairy products, such as yogurt, cottage cheese, and cheese. Beverages Water. Sugar-free drinks. Tea. Coffee. Low-fat or skim milk. Milk alternatives, such as soy or almond milk. Real fruit juice. Condiments Mustard. Relish. Low-fat, low-sugar ketchup and barbecue sauce. Low-fat or fat-free mayonnaise. Sweets and Desserts Sugar-free sweets. Fats and Oils Avocado. Canola or olive oil. Nuts and nut butters. Seeds. The items listed above may not be a complete list of recommended foods or beverages. Contact your dietitian for more options. What foods are not recommended? Palm oil and coconut oil. Processed foods. Fried foods. Sweetened drinks, such as sweet tea, milkshakes, snow cones, iced sweet drinks, and sodas. Alcohol. Sweets. Foods that contain a lot of salt or sodium. The items listed above may not be a complete list of foods and beverages to avoid. Contact your dietitian for more information. This information is not intended to replace advice given to you by your health care provider. Make sure you discuss any questions you have with your health care provider. Document Released: 04/14/2015 Document Revised: 05/05/2016 Document Reviewed: 12/23/2014 Elsevier Interactive Patient Education  2019 [employer]  Fatty Liver Disease  Fatty liver disease occurs when too much fat has built up in your liver cells. Fatty liver disease is also called hepatic steatosis or steatohepatitis. The liver removes harmful substances from your bloodstream and produces fluids that your body needs. It also helps your body use and store energy from the food you eat. In many cases, fatty liver disease does not cause symptoms or problems. It is often diagnosed when tests  are being done for other reasons. However, over time, fatty liver can cause inflammation that may lead to more serious liver  problems, such as scarring of the liver (cirrhosis) and liver failure. Fatty liver is associated with insulin resistance, increased body fat, high blood pressure (hypertension), and high cholesterol. These are features of metabolic syndrome and increase your risk for stroke, diabetes, and heart disease. What are the causes? This condition may be caused by:  Drinking too much alcohol.  Poor nutrition.  Obesity.  Cushing's syndrome.  Diabetes.  High cholesterol.  Certain drugs.  Poisons.  Some viral infections.  Pregnancy. What increases the risk? You are more likely to develop this condition if you:  Abuse alcohol.  Are overweight.  Have diabetes.  Have hepatitis.  Have a high triglyceride level.  Are pregnant. What are the signs or symptoms? Fatty liver disease often does not cause symptoms. If symptoms do develop, they can include:  Fatigue.  Weakness.  Weight loss.  Confusion.  Abdominal pain.  Nausea and vomiting.  Yellowing of your skin and the white parts of your eyes (jaundice).  Itchy skin. How is this diagnosed? This condition may be diagnosed by:  A physical exam and medical history.  Blood tests.  Imaging tests, such as an ultrasound, CT scan, or MRI.  A liver biopsy. A small sample of liver tissue is removed using a needle. The sample is then looked at under a microscope. How is this treated? Fatty liver disease is often caused by other health conditions. Treatment for fatty liver may involve medicines and lifestyle changes to manage conditions such as:  Alcoholism.  High cholesterol.  Diabetes.  Being overweight or obese. Follow these instructions at home:   Do not drink alcohol. If you have trouble quitting, ask your health care provider how to safely quit with the help of medicine or a supervised program. This is important to keep your condition from getting worse.  Eat a healthy diet as told by your health care provider.  Ask your health care provider about working with a diet and nutrition specialist (dietitian) to develop an eating plan.  Exercise regularly. This can help you lose weight and control your cholesterol and diabetes. Talk to your health care provider about an exercise plan and which activities are best for you.  Take over-the-counter and prescription medicines only as told by your health care provider.  Keep all follow-up visits as told by your health care provider. This is important. Contact a health care provider if: You have trouble controlling your:  Blood sugar. This is especially important if you have diabetes.  Cholesterol.  Drinking of alcohol. Get help right away if:  You have abdominal pain.  You have jaundice.  You have nausea and vomiting.  You vomit blood or material that looks like coffee grounds.  You have stools that are black, tar-like, or bloody. Summary  Fatty liver disease develops when too much fat builds up in the cells of your liver.  Fatty liver disease often causes no symptoms or problems. However, over time, fatty liver can cause inflammation that may lead to more serious liver problems, such as scarring of the liver (cirrhosis).  You are more likely to develop this condition if you abuse alcohol, are pregnant, are overweight, have diabetes, have hepatitis, or have high triglyceride levels.  Contact your health care provider if you have trouble controlling your weight, blood sugar, cholesterol, or drinking of alcohol. This information is not intended to replace advice  given to you by your health care provider. Make sure you discuss any questions you have with your health care provider. Document Released: 01/13/2006 Document Revised: 09/06/2017 Document Reviewed: 09/06/2017 Elsevier Interactive Patient Education  [address]    Results for Jesse Black, Jesse AUSTIN "Johanthan MCQUISTON" (MRN [id number]) as of 02/28/2019 13:00  Ref. Range 01/30/2019 14:40   Alkaline Phosphatase Latest Ref Range: 39 - 117 U/L 68  Albumin Latest Ref Range: 3.5 - 5.2 g/dL 4.4  AST Latest Ref Range: 0 - 37 U/L 61 (H)  ALT Latest Ref Range: 0 - 53 U/L 126 (H)    Nonalcoholic Fatty Liver Disease Diet Nonalcoholic fatty liver disease is a condition that causes fat to accumulate in and around the liver. The disease makes it harder for the liver to work the way that it should. Following a healthy diet can help to keep nonalcoholic fatty liver disease under control. It can also help to prevent or improve conditions that are associated with the disease, such as heart disease, diabetes, high blood pressure, and abnormal cholesterol levels. Along with regular exercise, this diet:  Promotes weight loss.  Helps to control blood sugar levels.  Helps to improve the way that the body uses insulin. What do I need to know about this diet?  Use the glycemic index (GI) to plan your meals. The index tells you how quickly a food will raise your blood sugar. Choose low-GI foods. These foods take a longer time to raise blood sugar.  Keep track of how many calories you take in. Eating the right amount of calories will help you to achieve a healthy weight.  You may want to follow a Mediterranean diet. This diet includes a lot of vegetables, lean meats or fish, whole grains, fruits, and healthy oils and fats. What foods can I eat? Grains Whole grains, such as whole-wheat or whole-grain breads, crackers, tortillas, cereals, and pasta. Stone-ground whole wheat. Pumpernickel bread. Unsweetened oatmeal. Bulgur. Barley. Quinoa. Brown or wild rice. Corn or whole-wheat flour tortillas. Vegetables Lettuce. Spinach. Peas. Beets. Cauliflower. Cabbage. Broccoli. Carrots. Tomatoes. Squash. Eggplant. Herbs. Peppers. Onions. Cucumbers. Brussels sprouts. Yams and sweet potatoes. Beans. Lentils. Fruits Bananas. Apples. Oranges. Grapes. Papaya. Mango. Pomegranate. Kiwi. Grapefruit. Cherries. Meats and  Other Protein Sources Seafood and shellfish. Lean meats. Poultry. Tofu. Dairy Low-fat or fat-free dairy products, such as yogurt, cottage cheese, and cheese. Beverages Water. Sugar-free drinks. Tea. Coffee. Low-fat or skim milk. Milk alternatives, such as soy or almond milk. Real fruit juice. Condiments Mustard. Relish. Low-fat, low-sugar ketchup and barbecue sauce. Low-fat or fat-free mayonnaise. Sweets and Desserts Sugar-free sweets. Fats and Oils Avocado. Canola or olive oil. Nuts and nut butters. Seeds. The items listed above may not be a complete list of recommended foods or beverages. Contact your dietitian for more options. What foods are not recommended? Palm oil and coconut oil. Processed foods. Fried foods. Sweetened drinks, such as sweet tea, milkshakes, snow cones, iced sweet drinks, and sodas. Alcohol. Sweets. Foods that contain a lot of salt or sodium. The items listed above may not be a complete list of foods and beverages to avoid. Contact your dietitian for more information. This information is not intended to replace advice given to you by your health care provider. Make sure you discuss any questions you have with your health care provider. Document Released: 04/14/2015 Document Revised: 05/05/2016 Document Reviewed: 12/23/2014 Elsevier Interactive Patient Education  2019 [employer]  Fatty Liver Disease  Fatty liver disease occurs when too much  fat has built up in your liver cells. Fatty liver disease is also called hepatic steatosis or steatohepatitis. The liver removes harmful substances from your bloodstream and produces fluids that your body needs. It also helps your body use and store energy from the food you eat. In many cases, fatty liver disease does not cause symptoms or problems. It is often diagnosed when tests are being done for other reasons. However, over time, fatty liver can cause inflammation that may lead to more serious liver problems, such as scarring  of the liver (cirrhosis) and liver failure. Fatty liver is associated with insulin resistance, increased body fat, high blood pressure (hypertension), and high cholesterol. These are features of metabolic syndrome and increase your risk for stroke, diabetes, and heart disease. What are the causes? This condition may be caused by:  Drinking too much alcohol.  Poor nutrition.  Obesity.  Cushing's syndrome.  Diabetes.  High cholesterol.  Certain drugs.  Poisons.  Some viral infections.  Pregnancy. What increases the risk? You are more likely to develop this condition if you:  Abuse alcohol.  Are overweight.  Have diabetes.  Have hepatitis.  Have a high triglyceride level.  Are pregnant. What are the signs or symptoms? Fatty liver disease often does not cause symptoms. If symptoms do develop, they can include:  Fatigue.  Weakness.  Weight loss.  Confusion.  Abdominal pain.  Nausea and vomiting.  Yellowing of your skin and the white parts of your eyes (jaundice).  Itchy skin. How is this diagnosed? This condition may be diagnosed by:  A physical exam and medical history.  Blood tests.  Imaging tests, such as an ultrasound, CT scan, or MRI.  A liver biopsy. A small sample of liver tissue is removed using a needle. The sample is then looked at under a microscope. How is this treated? Fatty liver disease is often caused by other health conditions. Treatment for fatty liver may involve medicines and lifestyle changes to manage conditions such as:  Alcoholism.  High cholesterol.  Diabetes.  Being overweight or obese. Follow these instructions at home:   Do not drink alcohol. If you have trouble quitting, ask your health care provider how to safely quit with the help of medicine or a supervised program. This is important to keep your condition from getting worse.  Eat a healthy diet as told by your health care provider. Ask your health care  provider about working with a diet and nutrition specialist (dietitian) to develop an eating plan.  Exercise regularly. This can help you lose weight and control your cholesterol and diabetes. Talk to your health care provider about an exercise plan and which activities are best for you.  Take over-the-counter and prescription medicines only as told by your health care provider.  Keep all follow-up visits as told by your health care provider. This is important. Contact a health care provider if: You have trouble controlling your:  Blood sugar. This is especially important if you have diabetes.  Cholesterol.  Drinking of alcohol. Get help right away if:  You have abdominal pain.  You have jaundice.  You have nausea and vomiting.  You vomit blood or material that looks like coffee grounds.  You have stools that are black, tar-like, or bloody. Summary  Fatty liver disease develops when too much fat builds up in the cells of your liver.  Fatty liver disease often causes no symptoms or problems. However, over time, fatty liver can cause inflammation that may lead to  more serious liver problems, such as scarring of the liver (cirrhosis).  You are more likely to develop this condition if you abuse alcohol, are pregnant, are overweight, have diabetes, have hepatitis, or have high triglyceride levels.  Contact your health care provider if you have trouble controlling your weight, blood sugar, cholesterol, or drinking of alcohol. This information is not intended to replace advice given to you by your health care provider. Make sure you discuss any questions you have with your health care provider. Document Released: 01/13/2006 Document Revised: 09/06/2017 Document Reviewed: 09/06/2017 Elsevier Interactive Patient Education  2019 [employer] Inc.   Insomnia Insomnia is a sleep disorder that makes it difficult to fall asleep or stay asleep. Insomnia can cause fatigue, low energy,  difficulty concentrating, mood swings, and poor performance at work or school. There are three different ways to classify insomnia:  Difficulty falling asleep.  Difficulty staying asleep.  Waking up too early in the morning. Any type of insomnia can be long-term (chronic) or short-term (acute). Both are common. Short-term insomnia usually lasts for three months or less. Chronic insomnia occurs at least three times a week for longer than three months. What are the causes? Insomnia may be caused by another condition, situation, or substance, such as:  Anxiety.  Certain medicines.  Gastroesophageal reflux disease (GERD) or other gastrointestinal conditions.  Asthma or other breathing conditions.  Restless legs syndrome, sleep apnea, or other sleep disorders.  Chronic pain.  Menopause.  Stroke.  Abuse of alcohol, tobacco, or illegal drugs.  Mental health conditions, such as depression.  Caffeine.  Neurological disorders, such as Alzheimer's disease.  An overactive thyroid (hyperthyroidism). Sometimes, the cause of insomnia may not be known. What increases the risk? Risk factors for insomnia include:  Gender. Women are affected more often than men.  Age. Insomnia is more common as you get older.  Stress.  Lack of exercise.  Irregular work schedule or working night shifts.  Traveling between different time zones.  Certain medical and mental health conditions. What are the signs or symptoms? If you have insomnia, the main symptom is having trouble falling asleep or having trouble staying asleep. This may lead to other symptoms, such as:  Feeling fatigued or having low energy.  Feeling nervous about going to sleep.  Not feeling rested in the morning.  Having trouble concentrating.  Feeling irritable, anxious, or depressed. How is this diagnosed? This condition may be diagnosed based on:  Your symptoms and medical history. Your health care provider may ask  about: ? Your sleep habits. ? Any medical conditions you have. ? Your mental health.  A physical exam. How is this treated? Treatment for insomnia depends on the cause. Treatment may focus on treating an underlying condition that is causing insomnia. Treatment may also include:  Medicines to help you sleep.  Counseling or therapy.  Lifestyle adjustments to help you sleep better. Follow these instructions at home: Eating and drinking   Limit or avoid alcohol, caffeinated beverages, and cigarettes, especially close to bedtime. These can disrupt your sleep.  Do not eat a large meal or eat spicy foods right before bedtime. This can lead to digestive discomfort that can make it hard for you to sleep. Sleep habits   Keep a sleep diary to help you and your health care provider figure out what could be causing your insomnia. Write down: ? When you sleep. ? When you wake up during the night. ? How well you sleep. ? How rested you feel  the next day. ? Any side effects of medicines you are taking. ? What you eat and drink.  Make your bedroom a dark, comfortable place where it is easy to fall asleep. ? Put up shades or blackout curtains to block light from outside. ? Use a white noise machine to block noise. ? Keep the temperature cool.  Limit screen use before bedtime. This includes: ? Watching TV. ? Using your smartphone, tablet, or computer.  Stick to a routine that includes going to bed and waking up at the same times every day and night. This can help you fall asleep faster. Consider making a quiet activity, such as reading, part of your nighttime routine.  Try to avoid taking naps during the day so that you sleep better at night.  Get out of bed if you are still awake after 15 minutes of trying to sleep. Keep the lights down, but try reading or doing a quiet activity. When you feel sleepy, go back to bed. General instructions  Take over-the-counter and prescription medicines  only as told by your health care provider.  Exercise regularly, as told by your health care provider. Avoid exercise starting several hours before bedtime.  Use relaxation techniques to manage stress. Ask your health care provider to suggest some techniques that may work well for you. These may include: ? Breathing exercises. ? Routines to release muscle tension. ? Visualizing peaceful scenes.  Make sure that you drive carefully. Avoid driving if you feel very sleepy.  Keep all follow-up visits as told by your health care provider. This is important. Contact a health care provider if:  You are tired throughout the day.  You have trouble in your daily routine due to sleepiness.  You continue to have sleep problems, or your sleep problems get worse. Get help right away if:  You have serious thoughts about hurting yourself or someone else. If you ever feel like you may hurt yourself or others, or have thoughts about taking your own life, get help right away. You can go to your nearest emergency department or call:  Your local emergency services (911 in the [place]  A suicide crisis helpline, such as the National Suicide Prevention Lifeline at (856) 555-5825. This is open 24 hours a day. Summary  Insomnia is a sleep disorder that makes it difficult to fall asleep or stay asleep.  Insomnia can be long-term (chronic) or short-term (acute).  Treatment for insomnia depends on the cause. Treatment may focus on treating an underlying condition that is causing insomnia.  Keep a sleep diary to help you and your health care provider figure out what could be causing your insomnia. This information is not intended to replace advice given to you by your health care provider. Make sure you discuss any questions you have with your health care provider. Document Released: 11/25/2000 Document Revised: 09/07/2017 Document Reviewed: 09/07/2017 Elsevier Interactive Patient Education  2019 [employer].

## 2019-03-01 ENCOUNTER — Telehealth: Payer: Self-pay | Admitting: Internal Medicine

## 2019-03-01 NOTE — Telephone Encounter (Signed)
Yes goal is <130/<80   TMS

## 2019-03-01 NOTE — Telephone Encounter (Signed)
135/85 is patients average BP at home they would like to know if they are needing to really up the BP medication.  Return call back to [phone]

## 2019-03-01 NOTE — Telephone Encounter (Signed)
Patients wife has been informed

## 2019-03-01 NOTE — Telephone Encounter (Signed)
Copied from CRM [id number]. Topic: General - Other >> Mar 01, 2019  8:48 AM Tamela Oddi wrote: Reason for CRM: Patient's wife called to request that the nurse call her regarding the patient's BP mediation.  Wife believes  that she gave the wrong reading to the doctor at his visit.  Please advise and call wife or patient back at [phone]

## 2019-03-04 ENCOUNTER — Ambulatory Visit: Payer: Self-pay | Admitting: *Deleted

## 2019-03-04 ENCOUNTER — Ambulatory Visit: Payer: BLUE CROSS/BLUE SHIELD

## 2019-03-04 NOTE — Telephone Encounter (Signed)
Talked with patient he voiced understanding to advice. Patient wife not available will go to [facility] or ER if symptoms worsen.

## 2019-03-04 NOTE — Telephone Encounter (Signed)
Inform wife    If he does not have fever 100.4 or higher, dry cough, and shortness of breath we are not testing.  We are not testing for covid 19 nor flu currently in the office and the out of office tent testing has ceased  Everyone needs to stay at home until further notified by the government this is quarentine time mandatory not optional  If he is getting worse go to emergency room   He can try Tylenol, Robitussin DM or mucinex DM for cough, warm tea with honey and lemon and stay at home unless he is significantly worsening

## 2019-03-04 NOTE — Telephone Encounter (Signed)
  Pt's wife called regarding if he husband needs to be tested for Covid19. Per protocol, he does not have the symptoms or has been in contact with anyone that they know for sure has tested positive for the virus. Th patient has had a stroke and wife stated he has a cough and cold.  He has been to [employer] and in contact with other family members outdoors.  Advised to keep the patient at home and avoid contact with other people. Signs and symptoms discussed with the wife. She voiced understanding and will call back for any changes. Also advised for respiratory distress, to call 911, with verbal understanding.  Routing to flow at [facility].  Reason for Disposition . [1] Caller concerned that exposure to COVID-19 occurred BUT [2] does not meet COVID-19 EXPOSURE criteria from [facility]  Answer Assessment - Initial Assessment Questions 1. CONFIRMED CASE: "Who is the person with the confirmed COVID-19 infection that you were exposed to?"     no 2. PLACE of CONTACT: "Where were you when you were exposed to COVID-19  (coronavirus disease 2019)?" (e.g., city, state, country)     [employer] and close quarters with family 3. TYPE of CONTACT: "How much contact was there?" (e.g., live in same house, work in same office, same school)     [place] with family 4. DATE of CONTACT: "When did you have contact with a coronavirus patient?" (e.g., days)     Last week (Thursday and Friday) [employer] yesterday 5. DURATION of CONTACT: "How long were you in contact with the COVID-19 (coronavirus disease) patient?" (e.g., a few seconds, passed by person, a few minutes, live with the patient)     A few minut4es 6. SYMPTOMS: "Do you have any symptoms?" (e.g., fever, cough, breathing difficulty)     Cough  7. PREGNANCY OR POSTPARTUM: "Is there any chance you are pregnant?" "When was your last menstrual period?" "Did you deliver in the last 2 weeks?"     n/a 8. HIGH RISK: "Do you have any heart or lung  problems? Do you have a weakened immune system?" (e.g., CHF, COPD, asthma, HIV positive, chemotherapy, renal failure, diabetes mellitus, sickle cell anemia)     Stroke, ?afib, pre diabetic  Protocols used: CORONAVIRUS (COVID-19) EXPOSURE-A-AH

## 2019-03-07 ENCOUNTER — Encounter: Admission: RE | Payer: Self-pay | Source: Home / Self Care

## 2019-03-07 ENCOUNTER — Ambulatory Visit: Admission: RE | Admit: 2019-03-07 | Payer: BLUE CROSS/BLUE SHIELD | Source: Home / Self Care | Admitting: Cardiology

## 2019-03-07 SURGERY — LOOP RECORDER INSERTION
Anesthesia: LOCAL

## 2019-03-08 DIAGNOSIS — I69319 Unspecified symptoms and signs involving cognitive functions following cerebral infarction: Secondary | ICD-10-CM | POA: Diagnosis not present

## 2019-03-08 DIAGNOSIS — I6340 Cerebral infarction due to embolism of unspecified cerebral artery: Secondary | ICD-10-CM | POA: Diagnosis not present

## 2019-03-11 ENCOUNTER — Other Ambulatory Visit: Payer: Self-pay

## 2019-03-11 ENCOUNTER — Ambulatory Visit
Admission: RE | Admit: 2019-03-11 | Discharge: 2019-03-11 | Disposition: A | Discharge status: Home / Self Care | Payer: BLUE CROSS/BLUE SHIELD | Source: Ambulatory Visit | Attending: Internal Medicine | Admitting: Internal Medicine

## 2019-03-11 DIAGNOSIS — R748 Abnormal levels of other serum enzymes: Secondary | ICD-10-CM | POA: Diagnosis not present

## 2019-03-14 ENCOUNTER — Telehealth: Payer: Self-pay | Admitting: Internal Medicine

## 2019-03-14 NOTE — Telephone Encounter (Signed)
Copied from CRM [id number]. Topic: General - Other >> Mar 14, 2019 12:16 PM Tamela Oddi wrote: Reason for CRM: Patient's wife called to request the results of her husband's abdomen scan that he had on Monday.  Wife said that she has not heard anything from the doctor or nurse as of yet.  Please advise and call wife back at [phone]

## 2019-03-14 NOTE — Telephone Encounter (Signed)
Called and lmtcb, in the notes it states that the pt was given his ultrasound results.  Sam Wunschel,cma

## 2019-03-15 DIAGNOSIS — I6340 Cerebral infarction due to embolism of unspecified cerebral artery: Secondary | ICD-10-CM | POA: Diagnosis not present

## 2019-04-01 ENCOUNTER — Other Ambulatory Visit: Payer: Self-pay | Admitting: Internal Medicine

## 2019-04-01 MED ORDER — OMEGA-3-ACID ETHYL ESTERS 1 G PO CAPS: 1.0000 g | ORAL_CAPSULE | Freq: Every day | ORAL | 1 refills | Status: DC

## 2019-04-01 NOTE — Telephone Encounter (Signed)
Requested medication (s) are due for refill today: yes  Requested medication (s) are on the active medication list:yes  Last refill:  01/24/2019  Future visit scheduled: no  Notes to clinic:  Historical provider    Requested Prescriptions  Pending Prescriptions Disp Refills   omega-3 acid ethyl esters (LOVAZA) 1 g capsule      Sig: Take 1 capsule (1 g total) by mouth daily.     Endocrinology:  Nutritional Agents Passed - 04/01/2019 10:08 AM      Passed - Valid encounter within last 12 months    Recent Outpatient Visits          1 month ago Essential hypertension   [facility] McLean-Scocuzza, Pasty Spillers, MD   2 months ago Cerebrovascular accident (CVA), unspecified mechanism [facility])   [facility] McLean-Scocuzza, Pasty Spillers, MD   3 years ago Hypertensive urgency   [facility] Garibaldi Plano, Verdis Frederickson, DO      Future Appointments            In 1 month McLean-Scocuzza, Pasty Spillers, MD [facility], [facility]

## 2019-04-09 DIAGNOSIS — R0683 Snoring: Secondary | ICD-10-CM | POA: Diagnosis not present

## 2019-04-09 DIAGNOSIS — I6340 Cerebral infarction due to embolism of unspecified cerebral artery: Secondary | ICD-10-CM | POA: Diagnosis not present

## 2019-04-09 DIAGNOSIS — I69319 Unspecified symptoms and signs involving cognitive functions following cerebral infarction: Secondary | ICD-10-CM | POA: Diagnosis not present

## 2019-04-11 DIAGNOSIS — G4733 Obstructive sleep apnea (adult) (pediatric): Secondary | ICD-10-CM | POA: Diagnosis not present

## 2019-04-17 ENCOUNTER — Other Ambulatory Visit: Payer: Self-pay | Admitting: Internal Medicine

## 2019-04-17 ENCOUNTER — Ambulatory Visit: Payer: Self-pay

## 2019-04-17 DIAGNOSIS — I10 Essential (primary) hypertension: Secondary | ICD-10-CM

## 2019-04-17 DIAGNOSIS — F1010 Alcohol abuse, uncomplicated: Secondary | ICD-10-CM

## 2019-04-17 DIAGNOSIS — I639 Cerebral infarction, unspecified: Secondary | ICD-10-CM

## 2019-04-17 DIAGNOSIS — K760 Fatty (change of) liver, not elsewhere classified: Secondary | ICD-10-CM | POA: Insufficient documentation

## 2019-04-17 DIAGNOSIS — Z8659 Personal history of other mental and behavioral disorders: Secondary | ICD-10-CM

## 2019-04-17 DIAGNOSIS — E785 Hyperlipidemia, unspecified: Secondary | ICD-10-CM

## 2019-04-17 DIAGNOSIS — R413 Other amnesia: Secondary | ICD-10-CM

## 2019-04-17 DIAGNOSIS — R7303 Prediabetes: Secondary | ICD-10-CM

## 2019-04-17 DIAGNOSIS — K824 Cholesterolosis of gallbladder: Secondary | ICD-10-CM

## 2019-04-17 NOTE — Telephone Encounter (Signed)
Pt wife called who states she fears that she may have given wrong dose of Benicar HCT. She is concerned because she was told by Pharmicist that she was requesting the medication too soon.  Jesse Black was informed of the dose strength change. She states that she was probably doing it correctly then. She states his BP has been great at goal. She was unable to remember the exact BP.  She states that her husband will be starting work again.  She is also having issues with his diet adherence.  She states that he is compulsive buying. She is requesting talk with Dr Jodene Nam about multiple questions. Per protocol call was transferred to office for scheduling.   Reason for Disposition . Caller has NON-URGENT medication question about med that PCP prescribed and triager unable to answer question  Answer Assessment - Initial Assessment Questions 1. SYMPTOMS: "Do you have any symptoms?"     No symptoms BP is normal per wife 2. SEVERITY: If symptoms are present, ask "Are they mild, moderate or severe?"     No symptoms.  Pt has started back to work.  Protocols used: MEDICATION QUESTION CALL-A-AH

## 2019-04-17 NOTE — Telephone Encounter (Signed)
I spoke with the pt and she states the medication is correct she did not give the wrong dosage I explained that with the increase to Benicar she ran out to early and the new Rx is there and she states she picked that up so that piece is ok, pt is concerned about her husbands diet, he has gained weight and she states the provider informed her to try the dash diet, she states all of a sudden the pt is craving a lot of sweet things, everyday and this is not normal for him he is not exercising and she want to know could the medication be causing this and is there a medication he could take to stop these cravings or should she see a nutritionist this is a new problem that she is having a hard time, dealing with.  Please advise.  Nina,cma

## 2019-04-17 NOTE — Telephone Encounter (Signed)
He should be on benicar 40-25 mg daily  I had informed wife previously to bring up compulsive behavior with neurology -does she want referral to psychiatry and nutrition?   There is no medication to stop cravings on my end   TMS

## 2019-04-17 NOTE — Telephone Encounter (Signed)
Pt states she wants both referrals and she wants to know if you can suggest a psychiatry and nutrition and she states she wants a good provider that she is comfortable with, would you recommend someone you would stand behind because she trusts you.  She want to start the referral with the psychiatry first.  So send the referral.  Nina,cma

## 2019-04-17 NOTE — Telephone Encounter (Signed)
Advise wife I referred him to Dr. Donell Beers in [place] a male psychiatrist instead of Dr[facility] Maryruth Bun [facility]   TMS

## 2019-04-18 NOTE — Telephone Encounter (Signed)
Called and gave the pt's wife the provider's name suggested for referral.  Nina,cma

## 2019-05-01 ENCOUNTER — Telehealth: Payer: Self-pay

## 2019-05-01 NOTE — Telephone Encounter (Signed)
[facility] impulsive behavior, etoh abuse concern, memory loss after stroke which is new Dr Donell Beers In [place]  See note pt needs asap psych appt referral has been placed early 04/2019  When is his appt call his wife to let her know the date  He also needs f/u with neurology [facility] neurology    TMS

## 2019-05-01 NOTE — Telephone Encounter (Signed)
RNCM called patient's wife Katharine Look [phone] regarding her call to case management regarding request for help due to change in behavior. Per Katharine Look, patient is:  -attempting- drinking/motorcycle/car driving; hiding it from family- didn't used to drink like this. -walking to places to eat concerning fellow employees- never did that before. 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 -regressed to child-like behaviors- taunting people. -insisting on taking boat to [place] against wife's will due to safety concern -apathetic to families concerns  -mean in his response to family concern -impulsive spending- causing financial issues -hiding large amounts of money and wife finds it; he forgot where he put money.  She requests referral to "specialist".    She feels like she has to "raise hell to get his needs met".   RNCM emailed [facility] RN and LPN with [facility] for home therapy and social work referral.  Covid barriers preventing hands on assessment from neurologist. My nursing assessment from what wife shares is that patient needs psych evaluation. Wife agrees to contacting PCP office for psych referral. DMV for safety with driving. Advised her to start process for disability with SSI as his health insurance is through his employer. Wife is seeing a Social worker and feels she has support needed. Stressed need to reach out to adult protective services if patient is having at-risk-behaviors. She shared that "he is taking risks like never before". Dr. Cephus Shelling name delivered to wife. [facility] number provided to her for community "team approach".  Barrier- Covid limitations.

## 2019-05-02 ENCOUNTER — Encounter: Payer: Self-pay | Admitting: *Deleted

## 2019-05-02 ENCOUNTER — Other Ambulatory Visit: Payer: Self-pay | Admitting: *Deleted

## 2019-05-02 NOTE — Patient Outreach (Signed)
[facility]) Care Management [facility] Community CM Telephone Outreach, new referral  05/02/2019  Jesse Black October 25, 1956 [id number]  1:30 pm: Unsuccessful telephone outreach to [name], wife/ caregiver, on [facility] DPR from 02/09/2019, for patient Jesse Black, 63 y/o male referred to [facility] Care Management today by inpatient RN CM at [facility].  Apparently, IP RN CM was contacted by patient's wife, who described recent behavior changes in patient and requested assistance in managing patient's care at home.  Patient has history including, but not limited to, recent CVA January 24, 2019- patient was discharged home home to self care on January 25, 2019 after refusing to remain in hospital; HTN/ HLD; pre-diabetes; (R) carotid artery stenosis.  HIPAA compliant voice mail message left for patient's wife, requesting return call back.  Plan:  Will place [facility] Community CM unsuccessful patient outreach letter in mail requesting call back in writing  Will re-attempt [facility] Community CM telephone outreach within 4 business days if I do not hear back from patient/ caregiver first.  Caryl Pina, RN, BSN, [employer] Coordinator [facility] Care Management  [phone]

## 2019-05-03 ENCOUNTER — Encounter: Payer: Self-pay | Admitting: *Deleted

## 2019-05-03 ENCOUNTER — Telehealth: Payer: Self-pay

## 2019-05-03 ENCOUNTER — Other Ambulatory Visit: Payer: Self-pay | Admitting: *Deleted

## 2019-05-03 ENCOUNTER — Telehealth: Payer: Self-pay | Admitting: Internal Medicine

## 2019-05-03 NOTE — Patient Outreach (Signed)
[facility] service [facility]) Care Management [facility] Community CM Telephone Outreach - new referral  05/03/2019  Jesse Black 02-08-1956 [id number]  10:15 am:  Suuccessful telephone outreach to Jesse Black, wife/ caregiver, on [facility] DPR from 01/30/2019, for patient Jesse Black, 63 y/o male referred to [facility] Care Management today by inpatient RN CM at [facility].  Apparently, IP RN CM was contacted by patient's wife, who described recent behavior changes in patient and requested assistance in managing patient's care at home.  Patient has history including, but not limited to, recent CVA January 24, 2019- patient was discharged home home to self care on January 25, 2019 after refusing to remain in hospital; HTN/ HLD; pre-diabetes; (R) carotid artery stenosis.  Caregiver returned my call from yesterday and left voice message requesting call back- call returned to caregiver within 30 minutes of her leaving voice message.  HIPAA/ identity verified with patient's spouse.  Today, patient's spouse reports that she is frustrated with patient's recent changes to behavior; states that over the last few weeks patient has demonstrated high risk behavior around consuming ETOH, driving, memory loss, and erratic behavior unusual from his normal baseline.  Caregiver reports that she and her family are "at their wits end" trying to help patient take care of himself, as patient appears to "be in denial" around his recent CVA.  Caregiver reports that she is trying to get patient in stroke programs and feels that patient may need psychiatric intervention.  Reports that patient continues to work and has had recent erratic behavior at work.  Reports patient resists her/ family efforts to intervene, and that patient refuses to receive help from his care providers.  Denies that patient is in acute distress; states that he is currently at work and even if he were available to talk with me today, "he wouldn't."  States that patient  "may be depressed," but she isn't sure if he is, because she is worried that his behavior changes may be a result of recent CVA, and she is fearful that patient may have "permanet changes" to his brain due to recent CVA.  [facility] CM services discussed with patient's caregiver who provides verbal consent for [facility] CM involvement in patient's care.  Caregiver further reports:  Medications: -- Has all medicationsand "usually" takes as prescribed;caregiver denies questions about current medications, but is not able to complete medication review today, as she is not at home during our conversation. Discussed importance of completing prompt medication review and caregiver is willing to complete at time of next [facility] CM phone outreach -- wife manages medications using weekly pill planner box- reports that patient "usually takes" as prescribed, however, she reports that sometimes patient refuses to take medications, and she questions that sometimes he holds medications in his mouth and doesn't swallow, in attempt to not take.  Reports that patient often becomes irritated/ angry when she attempts to make sure he takes his medications -- denies issues with affording medications  Provider appointments: -- All recent and upcoming provider appointments were reviewed with caregiver today ---- noted recently made revolving appointments to Windell Moment with [facility] stroke clinic: caregiver is unaware that these appointments have been made; I provided contact and address information for provider, and while caregiver was on phone, placed care coordination outreach at 11:15 am to [facility] rehabilitation stroke clinic [phone]); unfortunately, I was unable to connect with a staff member and left voice message requesting that stroke clinic team contact wife to confirm appointment.  Wife reports that she  [facility] and she states she plans to go into office today to confirm these appointments,  as this weekend is a holiday weekend and she "doesn't want to miss" patient keeping these appointments. ---- discussed that from review of EMR, it appears that patient's PCP has placed a referral to psychiatry provider Dr. Donell BeersPlovsky- provided contact numbers for provider-- encouraged caregiver to promptly contact provider to schedule an appointment; she verbalizes understanding and agreement ---- Upcoming PCP appointment noted Friday May 17, 2019: caregiver reports patient will attend this appointment and that she will provide transportation and attend along with him; I encouraged caregiver to ensure with PCP that patient has confirmed referral to psychiatry, if she is unable to schedule this herself and she verbalizes agreement. ---- Noted through review of EMR that there are no upcoming provider appointments with patient's neurology provider- encouraged caregiver to promptly schedule follow up appointment with his existing provider if he does not have an appointment scheduled when she re-checks her schedule once she is at home.  Social/ [employer] needs: -- currently denies [employer] resource needs, stating supportive family members that assist with care needs as indicated; unfortunately, caregiver reports that patient has progressively become unreceptive to accepting help from family members and often becomes angry when family members try to help him with care needs, such as medication administration -- family provides transportation for patient to all provider appointments, errands, etc-- however, caregiver adds that over the last few weeks, patient has been driving himself, and she believes this is a safety issue, as she feels he is not ready to be driving self  Advanced Directive (AD) Planning:   --reports patient does currently have exisisting AD in place for HCPOA, and reports that she is patient's HCPOA; reports that she can't remember whether or not patient created a living will.  Discussed  with caregiver limitations of HCPOA documents and encouraged her to share this document with patient's PCP at upcoming scheduled appointment for uploading into medical record- caregiver verbalizes understanding and agreement.  Discussed that I would place educational material in mail to patient and caregiver around creation of Living Will  Self-health management of HTN/ recent CVA: -- Caregiver reports that she believes patient is in denial that he "even had a stroke;" states that patient reports that he "feels fine and wishes everyone would get off his back."  Wife is fearful that patient may have another CVA that "will be worse than the first." -- reports that she is often fearful of "trying to make patient take care of himself," stating that she "doesn't want to throw him into another stroke."  Discussed with patient's caregiver signs/ symptoms CVA and corresponding action plan and will place printed educational material in mail to patient/ caregiver -- caregiver states that patient "eats whatever he wants and does whatever he wants;" she admits that she has very "little control" over patient's behavior and is not sure how to respond to patient when he becomes angry.   Caregiver denies further issues, concerns, or problems today.  I provided/ confirmed that she has my direct phone number, the main [facility] CM office phone number, and the [facility] CM 24-hour nurse advice phone number should issues arise prior to next scheduled [facility] Community CM outreach by phone next week.  Encouraged caregiver to contact me directly if needs, questions, issues, or concerns arise prior to next scheduled outreach; she agreed to do so.  Plan:  Patient will take medications as prescribed and  will attend all scheduled provider appointments  Patient/ caregiver will promptly notify care providers of new issues, problems, or concerns  I will place printed educational material in mail to patient/ caregiver around Advanced directive  planning and self-health management of recent CVA and patient and/ or caregiver will promptly review  Will follow patient short term under [facility] Community CM stroke service contract and will make patient's PCP aware of [facility] Community CM involvement in patient's care- will send PCP barriers letter  [facility] CM Care Plan Problem One     Most Recent Value  Care Plan Problem One  Need for caregiver action plan around ongoing management of recent CVA in patient who is unreceptive, as evidenced by caregiver reporting  Role Documenting the Problem One  Care Management Coordinator  Care Plan for Problem One  Active  [facility] Long Term Goal   Over the next 31 days, patient's will caregiver will verbalize action plan for ongoing management of patient's recent CVA and subsequent behavioral changes, as evidenced by patient caregiver reporting during [facility] RN CCM outreach   [facility] Long Term Goal Start Date  05/03/19  Interventions for Problem One Long Term Goal  Discussed caregiver's concerns with her around patient's recent changes in behavior and need for ongoing follow up post- recent CVA,  placed care coordination outreach to [facility] stroke clinic to verify/ confirm upcoming appointments as scheduled,  provided caregiver with phone numbers for recently placed psychiatry referral,  placed EMMI educational material in mail to patient/ caregiver around Advanced Directive planning for Living will and post-CVA self health management,  initiated short term [facility] CM program under stroke service contract  [facility] CM Short Term Goal #1   Over the next 30 days, patient will attend all scheduled provider appointments, as evidenced  by caregiver reporting during [facility] RN CCM outreach   [facility] CM Short Term Goal #1 Start Date  05/03/19  Interventions for Short Term Goal #1  Reviewed all recent and upcoming provider appointments with patient's caregiver,  confirmed that patient will attend all scheduled provider appointments,  provided phone numbers to all  recently referred providers for stroke clinic and psychiatry and placed care coordination outreach to newly referred stroke clinic provider to confirm upcoming appointment  [facility] CM Short Term Goal #2   Over the next 30 days, caregiver will verbalize signs/ symptoms CVA along with corresponding action plan for same, as evidenced by caregiver reporting during [facility] RN CCM outreach  [facility] CM Short Term Goal #2 Start Date  05/03/19  Interventions for Short Term Goal #2  Discussed with caregiver patient's previous presentation/ symptoms from recent CVA,  discussed general signs/ symptoms CVA with caregiver along with action plan and placed EMMI education in mail around signs/ symptoms/ action plan for CVA     I appreciate the opportunity to participate in Mr. Duclos's care,  Caryl Pina, RN, BSN, [employer] Coordinator [facility] Care Management  [phone]

## 2019-05-03 NOTE — Telephone Encounter (Signed)
Pt wife called William Dalton from [facility] stated that patients wife is concerned about psychiatry referral appointment. Wife is inquiring on an actual appointment date due to patients erratic behavior.

## 2019-05-03 NOTE — Telephone Encounter (Signed)
Jesse Black  McLean-Scocuzza, Pasty Spillers, MD        Your patient whom you referred for the Medical Nutrition Therapy Program has not come as you had requested.  Please discuss this with your patient.  When he/she decides to commit to the program, please let us know.   Patient stated they were not interested at this time. He wanted to think about it. Took our phone number to call back. If our office hears back we will let you know about an appointment.    Thank you for this referral, and if we can be of further assistance to you or your patient, let us know.     Cameron Proud  [facility] Health  [facility]  Administrative Clinical Assistant  Direct Dial: [phone]  Fax: [phone]  Website: [email or web address]

## 2019-05-03 NOTE — Patient Outreach (Signed)
[facility] Customer service manager [facility]) Care Management [facility] Community [facility] Telephone [facility] Coordination  05/03/2019  Oather Gonsalves 1956/11/08 [id number]  [facility] Community CM Telephone Outreach Care Coordination re:  Successful telephone outreach to Nate, receptionist for Dr. French Ana McLean-Scocuzza, PCP [phone]) of Dellis Filbert, 63 y/o malereferred to [facility] Care Management yesterdayby inpatient RN CM at [facility]. Apparently, IP RN CM was contacted by patient's wife, who described recent behavior changes in patient and requested assistance in managing patient's care at home. Patient has history including, but not limited to, recent CVA January 24, 2019- patient was discharged home home to self care on January 25, 2019 after refusing to remain in hospital; HTN/ HLD; pre-diabetes; (R) carotid artery stenosis.  Explained to Nate that I had spoken to patient's spouse today around her ongoing concerns with patient's recently described behavioral changes post- CVA; wife was uncertain as to status of psychiatry appointment from referral placed previously by PCP.  Patient's wife did not know whether she should contact psychiatry provider, as she thought she would be contacted by either patient's referring PCP or the psychiatry provider staff with appointment date for pending psychiatry referral.  Wife is anxious and worried about patient, as she has not yet heard back from anyone to schedule this appointment.  Provided my direct phone number to Nate, should PCP or PCP staff team have further questions.  Discussed need for this appointment to be made urgently based on patient's wife recent reports of his behavioral changes.  Plan:  Will collaborate as indicated with patient's PCP to ensure psychiatry referral/ appointment is completed as soon as possible  Caryl Pina, RN, BSN, CCRN Alumnus [facility] Coordinator [facility] Care Management  [phone]

## 2019-05-03 NOTE — Telephone Encounter (Signed)
Copied from CRM [id number]. Topic: General - Other >> May 03, 2019  2:24 PM Dalphine Handing A wrote: Maurice March from [facility] stated that patients wife is concerned about psychiatry referral appointment. Wife is inquiring on an actual appointment date due to patients erratic behavior.

## 2019-05-03 NOTE — Telephone Encounter (Signed)
Please check on psych referral   TMS

## 2019-05-07 ENCOUNTER — Encounter: Payer: Self-pay | Admitting: *Deleted

## 2019-05-07 ENCOUNTER — Other Ambulatory Visit: Payer: Self-pay | Admitting: *Deleted

## 2019-05-07 NOTE — Patient Outreach (Signed)
[facility] Customer service manager [facility]) Care Management [facility] CM Telephone Outreach Unsuccessful consecutive outreach attempt # 1- previously engaged caregiver  05/07/2019  Gayle Lefrancois [birth year] [id number]  Unsuccessful telephone outreach to [name], wife/ caregiver, on [facility] DPR from 01/30/2019, for patient Frederic Duley, 63 y/o maleref[facility] to [facility] Care Management todayby inpatient RN CM at [facility]. Apparently, IP RN CM was contacted by patient's wife, who described recent behavior changes in patient and requested assistance in managing patient's care at home. Patient has history including, but not limited to, recent CVA January 24, 2019- patient was discharged home home to self care on January 25, 2019 after refusing to remain in hospital; HTN/ HLD; pre-diabetes; (R) carotid artery stenosis.  HIPAA compliant voice mail message left for patient's caregiver/ spouse, requesting return call back.  Plan:  Will re-attempt [facility] CM telephone outreach within 4 business days if I do not hear back from patient's caregiver first.  Caryl Pina, RN, BSN, CCRN Alumnus [facility] Care Management  [phone]

## 2019-05-07 NOTE — Telephone Encounter (Signed)
I cant handle his behavior this and he needs to see neurology and psychiatry  Inform wife   TMS

## 2019-05-07 NOTE — Telephone Encounter (Signed)
Called Dr. Caprice Renshaw registration office [phone]. They state that there is a $150 registration fee regardless of insurance, so I am not able to register him as a new patient.  I contacted his wife-Sandra and advised her of this-gave phone number and woman's name Clydie Braun) that she needs to ask for. Wife questioning the need for psychiatry and what the psychiatry referral is for and if she requested this or if Dr. French Ana requested this. Per 02/2019 office note wife was questioning the need for psychiatry, neurology and said that the referral was placed due to impulsive behavior, memory loss and insomnia. She asked if this is something that Dr. French Ana can handle. I informed her that no she does not handle this type of service since medications will need to be altered until he is at his adequate dose.  She states that he is starting his care at [facility] stroke clinic today and will find out if this will include any cognitive treatments as well. If it does not she will then contact Dr. Caprice Renshaw office and pay the $150 registration fee. She also mentions again that she thinks that he needs the depression screen when he comes in for his appointment with you and state that he gets upset if she request this so she wish's that this is not mention to him that she is requesting this because she does not know if he will "explode" in the office.

## 2019-05-08 ENCOUNTER — Ambulatory Visit: Payer: BC Managed Care – PPO | Attending: Neurology | Admitting: Speech Pathology

## 2019-05-08 ENCOUNTER — Other Ambulatory Visit: Payer: Self-pay | Admitting: *Deleted

## 2019-05-08 ENCOUNTER — Encounter: Payer: Self-pay | Admitting: *Deleted

## 2019-05-08 ENCOUNTER — Other Ambulatory Visit: Payer: Self-pay

## 2019-05-08 DIAGNOSIS — R41841 Cognitive communication deficit: Secondary | ICD-10-CM | POA: Diagnosis not present

## 2019-05-08 NOTE — Patient Outreach (Signed)
[facility] Customer service manager [facility]) Care Management [facility] Community [facility] Telephone [facility] Coordination  05/08/2019  Jarry Mihalek [birth year] [id number]  11:15 am:  Eventual successful telephone outreach to [name], wife/ caregiver, on [facility] DPR from 01/30/2019, for patient Adrain Giacomo, 63 y/o malereferred to [facility] Care Management todayby inpatient RN CM at [facility]. Apparently, IP RN CM was contacted by patient's wife, who described recent behavior changes in patient and requested assistance in managing patient's care at home. Patient has history including, but not limited to, recent CVA January 24, 2019- patient was discharged home home to self care on January 25, 2019 after refusing to remain in hospital; HTN/ HLD; pre-diabetes; (R) carotid artery stenosis.  Caregiver returned my call from yesterday and left voice message requesting call back- call returned to caregiver and initially was unsuccessful- caregiver returned my call and left another voice message requesting call back.  HIPAA/ identity verified with caregiver today.  Caregiver reports being unable to complete assessment today as she is not at her home to perform medication review planned originally for yesterday afternoon.  Caregiver stated that she had successfully completed outreach to speech pathologist, and confirmed that she and patient would be attending scheduled office visit this afternoon as planned.  Caregiver questioned process to obtain patient's medical records from his hospitalization and this was explained to her.  Given that spouse is unavailable to complete initial assessment/ medication review today, we agreed that I would re-contact her tomorrow as scheduled for an update and would complete initial assessment and medication review at that time.  Encouraged patient's spouse to actively participate in today's scheduled office visit and to make sure her questions and concerns were addressed.  Encouraged spouse to  continue actively involving patient's care providers to address her stated concerns and have her and patient's questions answered and she verbalizes agreement and understanding and stated that she feels much more confident since our initial conversation.   Plan:  [facility] CM outreach to continue with scheduled phone call tomorrow for completion of initial assessment and medication review  Caryl Pina, RN, BSN, [employer] Coordinator [facility] Care Management  [phone]

## 2019-05-09 ENCOUNTER — Encounter: Payer: Self-pay | Admitting: Internal Medicine

## 2019-05-09 ENCOUNTER — Other Ambulatory Visit: Payer: Self-pay | Admitting: *Deleted

## 2019-05-09 ENCOUNTER — Other Ambulatory Visit: Payer: Self-pay | Admitting: Internal Medicine

## 2019-05-09 ENCOUNTER — Encounter: Payer: Self-pay | Admitting: *Deleted

## 2019-05-09 ENCOUNTER — Encounter: Payer: Self-pay | Admitting: Speech Pathology

## 2019-05-09 DIAGNOSIS — K760 Fatty (change of) liver, not elsewhere classified: Secondary | ICD-10-CM

## 2019-05-09 DIAGNOSIS — K824 Cholesterolosis of gallbladder: Secondary | ICD-10-CM

## 2019-05-09 DIAGNOSIS — R748 Abnormal levels of other serum enzymes: Secondary | ICD-10-CM

## 2019-05-09 DIAGNOSIS — E559 Vitamin D deficiency, unspecified: Secondary | ICD-10-CM

## 2019-05-09 MED ORDER — CHOLECALCIFEROL 1.25 MG (50000 UT) PO CAPS: 50000.0000 [IU] | ORAL_CAPSULE | ORAL | 0 refills | Status: DC

## 2019-05-09 NOTE — Therapy (Signed)
[facility] MAIN [facility] SERVICES [address] [place], [place], [address] Phone: [phone]   Fax:  [phone]  Speech Language Pathology Evaluation  Patient Details  Name: Jesse Black MRN: [id number] Date of Birth: 29-Feb-1956 Referring Provider (SLP): Dr. Sherryll Burger   Encounter Date: 05/08/2019  End of Session - 05/09/19 1613    Visit Number  1    Number of Visits  1    Date for SLP Re-Evaluation  05/09/19    SLP Start Time  1600    SLP Stop Time   1715    SLP Time Calculation (min)  75 min    Activity Tolerance  Patient tolerated treatment well       Past Medical History:  Diagnosis Date  . High blood pressure   . Hyperlipidemia   . Memory loss   . Prediabetes   . Stroke (cerebrum) (HCC)   . Stroke [facility])     Past Surgical History:  Procedure Laterality Date  . COLONOSCOPY WITH PROPOFOL N/A 10/05/2015   Procedure: COLONOSCOPY WITH PROPOFOL;  Surgeon: Scot Jun, MD;  Location[facility] ENDOSCOPY;  Service: Endoscopy;  Laterality: N/A;  . IR ANGIO INTRA EXTRACRAN SEL COM CAROTID INNOMINATE BILAT MOD SED  02/05/2019  . IR ANGIO VERTEBRAL SEL SUBCLAVIAN INNOMINATE UNI R MOD SED  02/05/2019  . IR ANGIO VERTEBRAL SEL VERTEBRAL UNI L MOD SED  02/05/2019  . IR US GUIDE VASC ACCESS RIGHT  02/05/2019    There were no vitals filed for this visit.      SLP Evaluation [facility] - 05/09/19 1607      SLP Visit Information   SLP Received On  05/08/19    Referring Provider (SLP)  Dr. Sherryll Burger    Onset Date  01/24/2019    Medical Diagnosis  CVA      Subjective   Subjective  The patient states that everything is going well and he is "back to normal"    Patient/Family Stated Goal  The patient's wife is very concerned regarding the behavioral changes that she has seen in her husband since the stroke.  These include losing time, reduced safety awareness, irritable, reduced focus, and reduced organization.        Pain Assessment   Pain Score   0-No pain      General Information   HPI  63 year old man S/P CVA 01/24/2019.  An MRI taken 01/24/2019: "1.2 cm acute infarction at the base of the brain on the left at the junction of the cerebral peduncle, anterior thalamus and radiating white matter tracts. Extensive old small vessel type infarctions elsewhere throughout the brain as outlined above. One could consider the possibility of demyelinating disease coexisting with small-vessel disease in this case, but that is less likely."  Patient has not received services for cognitive changes since the discharge.   Speech therapy evaluation 01/25/2019: The patient is presenting with mild cognitive communication impairment characterized by voluble speech, delayed recall impairment, impaired attention/vigilance, impairment of executive skills, and reduced awareness of deficits.  The patient scored 18/30 on the [facility].  Recommend that the patient have outpatient speech therapy for restorative and compensatory treatment of cognitive communication deficits.  He may benefit from referral to neuropsychology for more in-depth evaluation of cognitive function.      Prior Functional Status   Cognitive/Linguistic Baseline  Within functional limits    Vocation  Full time employment      Cognition   Overall Cognitive Status  Impaired/Different from  baseline      Auditory Comprehension   Overall Auditory Comprehension  Appears within functional limits for tasks assessed      Reading Comprehension   Reading Status  Within funtional limits      Expression   Primary Mode of Expression  Verbal      Verbal Expression   Overall Verbal Expression  Appears within functional limits for tasks assessed      Written Expression   Written Expression  Within Functional Limits      Oral Motor/Sensory Function   Overall Oral Motor/Sensory Function  Appears within functional limits for tasks assessed      Motor Speech   Overall Motor Speech  Appears within functional  limits for tasks assessed      Standardized Assessments   Standardized Assessments   Other Assessment   Neurobehavioral Cognitive Status Examination      Neurobehavioral Cognitive Status Examination Subtest   Score  Severity Range Level of Consciousness Alert  Average  Orientation   12/12  Average  Attention   8/8  Average  (repeat 6 digits forward) Language    Comprehension  6/6  Average (Follows 3-step commands)    Repetition    12/12  Average- repeats complex sentence    Naming   8/8  Average (no overt word retrieval deficits evident in conversation) Construction   6/6  Average (Completed 3/3 block designs in allotted time) Memory   12/12  Average  (recall 4/4 words after delay) Calculation   4/4  Average (Mental arithmetic) Reasoning    Similarities   7/8  Average (minimal difficulty abstracting similarity between 2 objects) Judgment   6/6  Average (Requires min cues for verbal problem solving)       SLP Education - 05/09/19 1612    Education Details  Lengthy discussion regarding results and recommendations    Person(s) Educated  Patient;Spouse    Methods  Explanation    Comprehension  Verbalized understanding           Plan - 05/09/19 1613    Clinical Impression Statement  At 3 months post onset CVA, the patient is presenting with mild cognitive communication deficits per testing. At the time of the stroke, the patient demonstrated voluble speech, delayed recall impairment, impaired attention/vigilance, impairment of executive skills, and reduced awareness of deficits and scored 18/30 on the MOCA.  Today, the patient scored within the average range for all the sections of the Neurobehavioral Cognitive Status Examination (NCSE).  This represents a significant improvement.  The patient did demonstrate minimal difficulty with the reasoning section of the NCSE- abstraction and problem solving.  The patient states that he is fine and "back to normal".  His wife is concerned  regarding changes in his behaviors and cognition including losing time, reduced safety awareness, irritable, reduced focus, and reduced organization.   Based on testing available to me, speech therapy is not indicated at this time.  Recommend neuropsychology evaluation for more in-depth cognitive testing, driving assessment with OT, and counseling from a provider that is familiar with stroke.    Speech Therapy Frequency  One time visit    Treatment/Interventions  Patient/family education    Consulted and Agree with Plan of Care  Patient;Family member/caregiver    Family Member Consulted  Spouse       Patient will benefit from skilled therapeutic intervention in order to improve the following deficits and impairments:   Cognitive communication deficit - Plan: SLP plan of care cert/re-cert  Problem List Patient Active Problem List   Diagnosis Date Noted  . Fatty liver 04/17/2019  . Gallbladder polyp 04/17/2019  . Insomnia 02/28/2019  . Prediabetes 01/31/2019  . Elevated liver enzymes 01/31/2019  . Hyperlipidemia 01/31/2019  . Intracranial atherosclerosis 01/31/2019  . 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  . Vitamin D deficiency 01/31/2019  . CVA (cerebral vascular accident) (HCC) 01/24/2019  . Preventative health care 07/31/2015  . Hypertension 07/31/2015   Dollene PrimroseSusan G Evelynne Spiers, MS/CCC- SLP  Leandrew KoyanagiAbernathy, Susie 05/09/2019, 4:18 PM  [facility] [address] [place], [place], [id number] Phone: [phone]   Fax:  [phone]  Name: Jesse Black MRN: [id number] Date of Birth: 01-11-1956

## 2019-05-09 NOTE — Patient Outreach (Signed)
[facility] Customer service manager [facility]) Care Management [facility] Community CM Telephone Outreach  05/09/2019  Avard Janssen [birth year] [id number]  Successful telephone outreach to [name], wife/ caregiver, on [facility] DPR from 01/30/2019, for patient Jesse Black, 63 y/o malereferred to [facility] Management todayby inpatient RN CM at [facility]. Apparently, IP RN CM was contacted by patient's wife, who described recent behavior changes in patient and requested assistance in managing patient's care at home. Patient has history including, but not limited to, recent CVA January 24, 2019- patient was discharged home home to self care on January 25, 2019 after refusing to remain in hospital; HTN/ HLD; pre-diabetes; (R) carotid artery stenosis.  HIPAA/ identity verified with patient's spouse; pleasant 90 minute phone call.  Today, wife/ caregiver Andrey Campanile reports things "are going much better;" states that she and patient have been attending marriage counseling and that they had a "very good session" this week, stating that their counselor was able to assist in communication issues and that patient "now understands" wife's concerns "much better."  Spouse reports that patient is not in pain and confirms that he continues to work every day.  Caregiver further reports:  Medications: -- Has all medicationsand continues taking as prescribed; wife assists in medication preparation and patient takes independently, only requiring occasional reminders from wife; uses blister packaging for morning medications; takes afternoon and evening medications directly from prescription bottle -- All patient medications were reviewed with caregiver today; no concerns or discrepancies were identified during medication review and medication list in EMR was updated according to caregiver report of currently active medications; purpose dosing and scheduling of all medications were reviewed with wife today -- denies issues with swallowing  medications  Medications Reviewed Today    Reviewed by Windell Moment, CCC-SLP (Speech and Language Pathologist) on 05/09/19 at 1612  Med List Status: <None>  Medication Order Taking? Sig Documenting Provider Last Dose Status Informant  acetaminophen (TYLENOL) 500 MG tablet [id number] No Take 500 mg by mouth daily as needed for moderate pain or headache. [provider] Taking Active Spouse/Significant Other  amLODipine (NORVASC) 5 MG tablet [id number] No Take 1 tablet (5 mg total) by mouth daily. McLean-Scocuzza, Pasty Spillers, MD Taking Active Spouse/Significant Other  aspirin EC 81 MG tablet [id number] No Take 81 mg by mouth daily. [provider] Taking Active Spouse/Significant Other  atorvastatin (LIPITOR) 40 MG tablet [id number] No Take 1 tablet (40 mg total) by mouth daily at 6 PM. McLean-Scocuzza, Pasty Spillers, MD Taking Active Spouse/Significant Other  calcium carbonate (TUMS - DOSED IN MG ELEMENTAL CALCIUM) 500 MG chewable tablet [id number] No Chew 1 tablet by mouth daily as needed for indigestion or heartburn. [provider] Taking Active Spouse/Significant Other  Cholecalciferol 1.25 MG (50000 UT) capsule [id number]  Take 1 capsule (50,000 Units total) by mouth every Sunday. McLean-Scocuzza, Pasty Spillers, MD  Active   clopidogrel (PLAVIX) 75 MG tablet [id number] No Take 1 tablet (75 mg total) by mouth daily. McLean-Scocuzza, Pasty Spillers, MD Taking Active Spouse/Significant Other  donepezil (ARICEPT) 10 MG tablet [id number] No Take 10 mg by mouth at bedtime. Lonell Face, MD Taking Active Spouse/Significant Other  hydrALAZINE (APRESOLINE) 25 MG tablet [id number] No Take 1 tablet (25 mg total) by mouth every 8 (eight) hours. McLean-Scocuzza, Pasty Spillers, MD Taking Active Spouse/Significant Other  Melatonin 5 MG CAPS [id number] No Take 5 mg by mouth at bedtime as needed (sleep). [provider] Taking Active Spouse/Significant Other  metoprolol tartrate (LOPRESSOR) 25 MG tablet  [id number] No Take  1 tablet (25 mg total) by mouth 2 (two) times daily. McLean-Scocuzza, Pasty Spillers, MD Taking Active Spouse/Significant Other  Multiple Vitamin (MULTI-VITAMIN DAILY) TABS [id number] No Take 1 tablet by mouth daily. [provider] Taking Active Spouse/Significant Other  neomycin-polymyxin-hydrocortisone (CORTISPORIN) OTIC solution [id number] No Place 4 drops into the left ear 4 (four) times daily. x7-10 days  Patient not taking:  Reported on 05/09/2019   McLean-Scocuzza, Pasty Spillers, MD Not Taking Active   olmesartan-hydrochlorothiazide [facility] HCT) 40-25 MG tablet [id number] No Take 1 tablet by mouth daily. In am McLean-Scocuzza, Pasty Spillers, MD Taking Active   omega-3 acid ethyl esters (LOVAZA) 1 g capsule [id number] No Take 1 capsule (1 g total) by mouth daily. McLean-Scocuzza, Pasty Spillers, MD Taking Active   senna-docusate (SENOKOT-S) 8.6-50 MG tablet [id number] No Take 1 tablet by mouth at bedtime as needed for mild constipation. Ramonita Lab, MD Taking Active Spouse/Significant Other  Tetrahydrozoline HCl (VISINE OP) [id number] No Place 1 drop into both eyes daily as needed (dry eyes). [provider] Taking Active Spouse/Significant Other         Provider appointments: -- All recent and upcoming provider appointments were reviewed with caregiver today ---- attended yesterday's scheduled appointment with Windell Moment with [facility] stroke clinic: caregiver reports visit "went great;" and she adds that provider reported that patient "passed" exam and has no further needs for follow up with their clinic ---- Upcoming PCP appointment noted Friday May 17, 2019: caregiver again reports patient will attend this appointment and that she will provide transportation and attend along with him; I encouraged caregiver to ensure with PCP that patient has confirmed referral to psychiatry, and to discuss necessity of this referral, as spouse is not sure patient needs psychiatry or neuropsychology--  also encouraged spouse to discuss with neurologist involved in patient's care ---- Noted through review of EMR that there are no upcoming provider appointments scheduled with patient's neurology provider- encouraged caregiver to promptly schedule follow up appointment with his existing neurology provider-- states she will do  Safety/ Falls/ Mobility: -- reports no history of falls; no current use of assistive devices; general fall precautions/ prevention discussed with caregiver  Self-health management of HTN/ recent CVA: -- monitors and records blood pressure at home "about once a week;" states BP this week was "118/70;" discussed value of more frequent BP monitoring at home, and explained that this will assist care providers in managing medications- spouse reports she will increase BP monitoring and feels sure that patient will be agreeable to this.  Encouraged wife to take all home recorded blood pressures to upcoming PCP and to all provider appointments with them for provider review -- follows heart healthy, low sodium diet "most of the time," however, she admits that patient often has dietary indiscretions- states she is working with patient to improve diet and this was encouraged -- confirmed that she received printed educational material placed in mail to patient- we reviewed these today -- is aware of "driving program" for post-CVA patient's at [facility]- is looking into this as an option available for patient  Caregiver denies further issues, concerns, or problems today. She is interested in possibly obtaining medical records from patient's hospitalization, as well as speak to someone in Patient Experience department about their overall hospital experience; states she does not wish to place any complaints, but would like to talk to someone around their experience- both of these department phone numbers were provided to patien.  Confirmed that caregiver hasmy direct phone number, the main  [facility] CM  office phone number, and the [facility] 24-hour nurse advice phone number should issues arise prior to next scheduled [facility] Community CM outreach by phone in 2 and a half weeks.  Encouraged caregiver to contact me directly if needs, questions, issues, or concerns arise prior to next scheduled outreach; she agreed to do so.  Plan:  Patient will take medications as prescribed and will attend all scheduled provider appointments  Patient/ caregiver will promptly notify care providers of new issues, problems, or concerns  I will share today's notes and care plan with patient's PCP as initial assessment  Will [facility] with scheduled telephone outreach in 2 1/2 weeks, possibly for case closure, as indicated  [facility] CM Care Plan Problem One     Most Recent Value  Care Plan Problem One  Need for caregiver action plan around ongoing management of recent CVA in patient who is unreceptive, as evidenced by caregiver reporting  Role Documenting the Problem One  Care Management Coordinator  Care Plan for Problem One  Active  [facility] Long Term Goal   Over the next 31 days, patient's will caregiver will verbalize action plan for ongoing management of patient's recent CVA and subsequent behavioral changes, as evidenced by patient caregiver reporting during [facility] CCM outreach   [facility] Long [facility] Go[facility] Start Date  05/03/19  Interventions for Problem One Long Term Goal  Discussed with caregiver she and patient's recent progress around provider appointment scheduling and attended provider office visits,  reviewed with caregiver patient's currentl clinical condition and confirmed that caregiver verbalizes better confidence than last outreach around ongoing plan of care for patient's health maintenance needs,  initial assessment completed and shared with patient's PCP  [facility] CM Short Term Goal #1   Over the next 30 days, patient will attend all scheduled  provider appointments, as evidenced  by caregiver reporting [facility]   [facility] CM Short Term Goal #1 Start Date  05/03/19  Interventions for Short Term Goal #1  Reviewed with caregiver patient's recent office visit with speech pathologist at stroke clinic,  reviewed upcoming provider appointments and confirmed that caregiver will attend all with patient and will transport patient to all  [facility] CM Short Term Goal #2   Over the next 30 days, caregiver will verbalize signs/ symptoms CVA along with corresponding action plan for same, as evidenced by caregiver reporting during [facility]CM outreach  [facility] CM Short Term Goal #2 Start Date  05/03/19  Interventions for Short Term Goal #2  Discussed with caregiver patient's current BP management, diet, and medications,  medication review completed with caregiver,  confirmed that caregiver received previously mailed printed educational material around stroke management and prevention and reviewed these with caregiver     Caryl PinaLaine Mckinney Brissa Asante, RN, BSN, CCRN [employer] Community Care Coordinator [facility] Care Management  [phone]

## 2019-05-10 ENCOUNTER — Other Ambulatory Visit (INDEPENDENT_AMBULATORY_CARE_PROVIDER_SITE_OTHER): Payer: BLUE CROSS/BLUE SHIELD

## 2019-05-10 ENCOUNTER — Encounter: Payer: Self-pay | Admitting: Internal Medicine

## 2019-05-10 ENCOUNTER — Other Ambulatory Visit: Payer: Self-pay

## 2019-05-10 DIAGNOSIS — I10 Essential (primary) hypertension: Secondary | ICD-10-CM | POA: Diagnosis not present

## 2019-05-10 DIAGNOSIS — R7303 Prediabetes: Secondary | ICD-10-CM

## 2019-05-10 DIAGNOSIS — E785 Hyperlipidemia, unspecified: Secondary | ICD-10-CM

## 2019-05-10 LAB — LIPID PANEL
Cholesterol: 128 mg/dL (ref 0–200)
HDL: 41.7 mg/dL (ref >39.00)
LDL Cholesterol: 60 mg/dL (ref 0–99)
NonHDL: 85.91
Total CHOL/HDL Ratio: 3
Triglycerides: 130 mg/dL (ref 0.0–149.0)
VLDL: 26 mg/dL (ref 0.0–40.0)

## 2019-05-10 LAB — COMPREHENSIVE METABOLIC PANEL
ALT: 40 U/L (ref 0–53)
AST: 26 U/L (ref 0–37)
Albumin: 4.2 g/dL (ref 3.5–5.2)
Alkaline Phosphatase: 61 U/L (ref 39–117)
BUN: 16 mg/dL (ref 6–23)
CO2: 26 mEq/L (ref 19–32)
Calcium: 9.1 mg/dL (ref 8.4–10.5)
Chloride: 100 mEq/L (ref 96–112)
Creatinine, Ser: 1.29 mg/dL (ref 0.40–1.50)
GFR: 56.26 mL/min — ABNORMAL LOW (ref >60.00)
Glucose, Bld: 137 mg/dL — ABNORMAL HIGH (ref 70–99)
Potassium: 3.9 mEq/L (ref 3.5–5.1)
Sodium: 135 mEq/L (ref 135–145)
Total Bilirubin: 0.9 mg/dL (ref 0.2–1.2)
Total Protein: 6.6 g/dL (ref 6.0–8.3)

## 2019-05-10 LAB — HEMOGLOBIN A1C: Hgb A1c MFr Bld: 6.5 % (ref 4.6–6.5)

## 2019-05-13 ENCOUNTER — Encounter: Payer: Self-pay | Admitting: Internal Medicine

## 2019-05-14 ENCOUNTER — Ambulatory Visit: Payer: BLUE CROSS/BLUE SHIELD | Admitting: Speech Pathology

## 2019-05-15 ENCOUNTER — Encounter: Payer: Self-pay | Admitting: Internal Medicine

## 2019-05-16 ENCOUNTER — Ambulatory Visit: Payer: BLUE CROSS/BLUE SHIELD | Admitting: Speech Pathology

## 2019-05-17 ENCOUNTER — Other Ambulatory Visit: Payer: Self-pay

## 2019-05-17 ENCOUNTER — Ambulatory Visit (INDEPENDENT_AMBULATORY_CARE_PROVIDER_SITE_OTHER): Payer: BC Managed Care – PPO | Admitting: Internal Medicine

## 2019-05-17 ENCOUNTER — Encounter: Payer: Self-pay | Admitting: Internal Medicine

## 2019-05-17 ENCOUNTER — Other Ambulatory Visit: Admission: RE | Admit: 2019-05-17 | Payer: BC Managed Care – PPO | Source: Ambulatory Visit

## 2019-05-17 DIAGNOSIS — Z9989 Dependence on other enabling machines and devices: Secondary | ICD-10-CM

## 2019-05-17 DIAGNOSIS — E119 Type 2 diabetes mellitus without complications: Secondary | ICD-10-CM

## 2019-05-17 DIAGNOSIS — I639 Cerebral infarction, unspecified: Secondary | ICD-10-CM

## 2019-05-17 DIAGNOSIS — E559 Vitamin D deficiency, unspecified: Secondary | ICD-10-CM

## 2019-05-17 DIAGNOSIS — E785 Hyperlipidemia, unspecified: Secondary | ICD-10-CM | POA: Diagnosis not present

## 2019-05-17 DIAGNOSIS — R7303 Prediabetes: Secondary | ICD-10-CM | POA: Insufficient documentation

## 2019-05-17 DIAGNOSIS — I10 Essential (primary) hypertension: Secondary | ICD-10-CM

## 2019-05-17 DIAGNOSIS — G4733 Obstructive sleep apnea (adult) (pediatric): Secondary | ICD-10-CM | POA: Diagnosis not present

## 2019-05-17 NOTE — Progress Notes (Addendum)
Virtual Visit via Video Note  I connected with Jesse Black  on 05/22/19 at  2:11 PM EDT by a video enabled telemedicine application and verified that I am speaking with the correct person using two identifiers.  Location patient: home Location provider:work  Persons participating in the virtual visit: patient, provider, pts wife   I discussed the limitations of evaluation and management by telemedicine and the availability of in person appointments. The patient expressed understanding and agreed to proceed.   HPI: Of note wife anonymously wrote my chart 05/17/19 re appt today with c/w depression, nutrition and DM 2 Depression: when asked ? To patient he denied he was depressed and had prev been referred to Dr. Donell Beers in [facility] for erratic behavior, memory loss, etoh abuse since his stroke   Stroke due Neurology Dr. Sherryll Burger appt sch 06/07/19 wife wants him to do driving school before he drives on the road   HTN BP improved on norvasc 5 mg qd, hydralazine 25 tid, lopressor 25 bid, benicar hctz 40-25 BP ranging 109-130s/59-87 mostly controlled   DM 2 A1C 6.5 05/10/19 he declines to see nutrition for now but referral was previously placed   OSA on autopap 5-20 cm per neurology and getting machine soon to house. He reports the machine gave him a h/a and he woke up 4-6 x during the sleep study will have pt disc with Dr. Sherryll Burger he thinks he needs a repeat test.   HLD improved on lipitor 40 mg qhs    ROS: See pertinent positives and negatives per HPI.  Past Medical History:  Diagnosis Date  . High blood pressure   . Hyperlipidemia   . Memory loss   . Prediabetes   . Stroke (cerebrum) (HCC)   . Stroke [facility])     Past Surgical History:  Procedure Laterality Date  . COLONOSCOPY WITH PROPOFOL N/A 10/05/2015   Procedure: COLONOSCOPY WITH PROPOFOL;  Surgeon: Scot Jun, MD;  Location: [facility] ENDOSCOPY;  Service: Endoscopy;  Laterality: N/A;  . IR ANGIO INTRA EXTRACRAN SEL COM CAROTID INNOMINATE  BILAT MOD SED  02/05/2019  . IR ANGIO VERTEBRAL SEL SUBCLAVIAN INNOMINATE UNI R MOD SED  02/05/2019  . IR ANGIO VERTEBRAL SEL VERTEBRAL UNI L MOD SED  02/05/2019  . IR US GUIDE VASC ACCESS RIGHT  02/05/2019  . LOOP RECORDER INSERTION N/A 05/22/2019   Procedure: LOOP RECORDER INSERTION;  Surgeon: Marcina Millard, MD;  Location: [facility] INVASIVE CV LAB;  Service: Cardiovascular;  Laterality: N/A;    Family History  Problem Relation Age of Onset  . Lung cancer Father   . Dementia Mother     SOCIAL HX: married    Current Outpatient Medications:  .  acetaminophen (TYLENOL) 500 MG tablet, Take 500 mg by mouth daily as needed for moderate pain or headache., Disp: , Rfl:  .  amLODipine (NORVASC) 5 MG tablet, Take 1 tablet (5 mg total) by mouth daily., Disp: 90 tablet, Rfl: 1 .  aspirin EC 81 MG tablet, Take 81 mg by mouth daily., Disp: , Rfl:  .  atorvastatin (LIPITOR) 40 MG tablet, Take 1 tablet (40 mg total) by mouth daily at 6 PM., Disp: 90 tablet, Rfl: 3 .  calcium carbonate (TUMS - DOSED IN MG ELEMENTAL CALCIUM) 500 MG chewable tablet, Chew 1 tablet by mouth daily as needed for indigestion or heartburn., Disp: , Rfl:  .  Cholecalciferol 1.25 MG (50000 UT) capsule, Take 1 capsule (50,000 Units total) by mouth every Sunday., Disp: 13 capsule, Rfl:  0 .  clopidogrel (PLAVIX) 75 MG tablet, Take 1 tablet (75 mg total) by mouth daily., Disp: 90 tablet, Rfl: 1 .  donepezil (ARICEPT) 10 MG tablet, Take 10 mg by mouth at bedtime., Disp: , Rfl:  .  hydrALAZINE (APRESOLINE) 25 MG tablet, Take 1 tablet (25 mg total) by mouth every 8 (eight) hours., Disp: 270 tablet, Rfl: 1 .  Melatonin 5 MG CAPS, Take 5 mg by mouth at bedtime as needed (sleep)., Disp: , Rfl:  .  metoprolol tartrate (LOPRESSOR) 25 MG tablet, Take 1 tablet (25 mg total) by mouth 2 (two) times daily., Disp: 180 tablet, Rfl: 1 .  Multiple Vitamin (MULTI-VITAMIN DAILY) TABS, Take 1 tablet by mouth daily., Disp: , Rfl:  .   neomycin-polymyxin-hydrocortisone (CORTISPORIN) OTIC solution, Place 4 drops into the left ear 4 (four) times daily. x7-10 days (Patient taking differently: Place 4 drops into the left ear 4 (four) times daily as needed (itchiness in the ear). x7-10 days), Disp: 10 mL, Rfl: 0 .  olmesartan-hydrochlorothiazide (BENICAR HCT) 40-25 MG tablet, Take 1 tablet by mouth daily. In am, Disp: 90 tablet, Rfl: 3 .  omega-3 acid ethyl esters (LOVAZA) 1 g capsule, Take 1 capsule (1 g total) by mouth daily., Disp: 90 capsule, Rfl: 1 .  Tetrahydrozoline HCl (VISINE OP), Place 1 drop into both eyes daily as needed (dry eyes)., Disp: , Rfl:   EXAM:  VITALS per patient if applicable:  GENERAL: alert, oriented, appears well and in no acute distress  HEENT: atraumatic, conjunttiva clear, no obvious abnormalities on inspection of external nose and ears  NECK: normal movements of the head and neck  LUNGS: on inspection no signs of respiratory distress, breathing rate appears normal, no obvious gross SOB, gasping or wheezing  CV: no obvious cyanosis  MS: moves all visible extremities without noticeable abnormality  PSYCH/NEURO: pleasant and cooperative, no obvious depression or anxiety, speech and thought processing grossly intact, flat affect   ASSESSMENT AND PLAN:  Discussed the following assessment and plan:  Essential hypertension-cont meds check labs in future   Cerebrovascular accident (CVA), unspecified mechanism (HCC) -f/u neurology and psychiatry due to behavior change and c/w depression and etoh abuse has been rec   Hyperlipidemia, unspecified hyperlipidemia type -cont meds f/u labs in future   OSA on CPAP -per pt he feels like result inaccurate b/c he work up multiple times during the test and c/w h/a with cpap  Advised discussed with neurology   HM Flu shot utd  tdap utd 06/18/15 Consider shingrix in future  Consider hep A/B h/o fatty liver   Colonoscopy 10/05/15 Dr. Markham JordanElliot + polyps  bx neg repeat in 10 years  PSA 01/30/19 1.42     Neuropsychology note 07/30/19 Dr. 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 The patient was provided an opportunity to ask questions and all were answered. The patient agreed with the plan and demonstrated an understanding of the instructions.   The patient was advised to call back or seek an in-person evaluation if the symptoms worsen or if the condition fails to improve as anticipated.  Time spent 25 minutes  Bevelyn Bucklesracy N McLean-Scocuzza, MD

## 2019-05-20 ENCOUNTER — Other Ambulatory Visit
Admission: RE | Admit: 2019-05-20 | Discharge: 2019-05-20 | Disposition: A | Discharge status: Home / Self Care | Payer: BC Managed Care – PPO | Source: Ambulatory Visit | Attending: Cardiology | Admitting: Cardiology

## 2019-05-20 ENCOUNTER — Other Ambulatory Visit: Payer: Self-pay | Admitting: Neurology

## 2019-05-20 ENCOUNTER — Other Ambulatory Visit: Payer: Self-pay

## 2019-05-20 DIAGNOSIS — G4733 Obstructive sleep apnea (adult) (pediatric): Secondary | ICD-10-CM | POA: Diagnosis not present

## 2019-05-20 DIAGNOSIS — Z1159 Encounter for screening for other viral diseases: Secondary | ICD-10-CM | POA: Insufficient documentation

## 2019-05-20 DIAGNOSIS — I6340 Cerebral infarction due to embolism of unspecified cerebral artery: Secondary | ICD-10-CM

## 2019-05-21 ENCOUNTER — Ambulatory Visit: Payer: BLUE CROSS/BLUE SHIELD | Admitting: Speech Pathology

## 2019-05-21 DIAGNOSIS — K824 Cholesterolosis of gallbladder: Secondary | ICD-10-CM | POA: Diagnosis not present

## 2019-05-21 DIAGNOSIS — R748 Abnormal levels of other serum enzymes: Secondary | ICD-10-CM | POA: Diagnosis not present

## 2019-05-21 DIAGNOSIS — I6340 Cerebral infarction due to embolism of unspecified cerebral artery: Secondary | ICD-10-CM | POA: Diagnosis not present

## 2019-05-21 DIAGNOSIS — K760 Fatty (change of) liver, not elsewhere classified: Secondary | ICD-10-CM | POA: Diagnosis not present

## 2019-05-21 LAB — NOVEL CORONAVIRUS, NAA (HOSP ORDER, SEND-OUT TO REF LAB; TAT 18-24 HRS): SARS-CoV-2, NAA: NOT DETECTED

## 2019-05-22 ENCOUNTER — Encounter
Admission: RE | Disposition: A | Discharge status: Home / Self Care | Payer: Self-pay | Source: Home / Self Care | Attending: Cardiology

## 2019-05-22 ENCOUNTER — Encounter: Payer: Self-pay | Admitting: Cardiology

## 2019-05-22 ENCOUNTER — Ambulatory Visit
Admission: RE | Admit: 2019-05-22 | Discharge: 2019-05-22 | Disposition: A | Discharge status: Home / Self Care | Payer: BC Managed Care – PPO | Attending: Cardiology | Admitting: Cardiology

## 2019-05-22 DIAGNOSIS — Z79899 Other long term (current) drug therapy: Secondary | ICD-10-CM | POA: Diagnosis not present

## 2019-05-22 DIAGNOSIS — E785 Hyperlipidemia, unspecified: Secondary | ICD-10-CM | POA: Insufficient documentation

## 2019-05-22 DIAGNOSIS — Z7902 Long term (current) use of antithrombotics/antiplatelets: Secondary | ICD-10-CM | POA: Insufficient documentation

## 2019-05-22 DIAGNOSIS — I10 Essential (primary) hypertension: Secondary | ICD-10-CM | POA: Diagnosis not present

## 2019-05-22 DIAGNOSIS — G4730 Sleep apnea, unspecified: Secondary | ICD-10-CM | POA: Diagnosis not present

## 2019-05-22 DIAGNOSIS — I639 Cerebral infarction, unspecified: Secondary | ICD-10-CM | POA: Diagnosis not present

## 2019-05-22 DIAGNOSIS — Z7982 Long term (current) use of aspirin: Secondary | ICD-10-CM | POA: Diagnosis not present

## 2019-05-22 DIAGNOSIS — Z8673 Personal history of transient ischemic attack (TIA), and cerebral infarction without residual deficits: Secondary | ICD-10-CM | POA: Diagnosis not present

## 2019-05-22 HISTORY — PX: LOOP RECORDER INSERTION: EP1214

## 2019-05-22 SURGERY — LOOP RECORDER INSERTION
Anesthesia: LOCAL

## 2019-05-22 MED ORDER — LIDOCAINE-EPINEPHRINE (PF) 1 %-1:200000 IJ SOLN
INTRAMUSCULAR | Status: AC
  Filled 2019-05-22: qty 10

## 2019-05-22 MED ORDER — LIDOCAINE-EPINEPHRINE (PF) 1 %-1:200000 IJ SOLN
INTRAMUSCULAR | Status: DC | PRN
  Administered 2019-05-22: 10 mL

## 2019-05-22 SURGICAL SUPPLY — 2 items
LOOP REVEAL LINQSYS (Prosthesis & Implant Heart) ×2 IMPLANT
PACK LOOP INSERTION (CUSTOM PROCEDURE TRAY) ×2 IMPLANT

## 2019-05-22 NOTE — Patient Instructions (Signed)

## 2019-05-23 ENCOUNTER — Ambulatory Visit: Payer: BLUE CROSS/BLUE SHIELD | Admitting: Speech Pathology

## 2019-05-23 ENCOUNTER — Encounter: Payer: Self-pay | Admitting: Internal Medicine

## 2019-05-27 ENCOUNTER — Encounter: Payer: Self-pay | Admitting: Internal Medicine

## 2019-05-28 ENCOUNTER — Ambulatory Visit: Payer: BLUE CROSS/BLUE SHIELD | Admitting: Speech Pathology

## 2019-05-28 ENCOUNTER — Encounter: Payer: Self-pay | Admitting: *Deleted

## 2019-05-28 ENCOUNTER — Other Ambulatory Visit: Payer: Self-pay | Admitting: *Deleted

## 2019-05-28 NOTE — Patient Outreach (Signed)
[facility]) Care Management [place] Telephone Outreach Unsuccessful consecutive outreach attempt # 1- previously engaged caregiver 05/28/2019  Humphrey Guerreiro 01-Jan-1956 [id number]  2:10 pm: Unsuccessful telephone outreach to [name], wife/ caregiver, on [place] from 01/30/2019, for patient Genaro Bekker, 62 y/o malereferred to [place] Management todayby inpatient RN CM at [facility]. Apparently, IP RN CM was contacted by patient's wife, who described recent behavior changes in patient and requested assistance in managing patient's care at home. Patient has history including, but not limited to, recent CVA January 24, 2019- patient was discharged home home to self care on January 25, 2019 after refusing to remain in hospital; HTN/ HLD; pre-diabetes; (R) carotid artery stenosis.    HIPAA compliant voice mail message left for patient's caregiver, requesting return call back.  Plan:  Will re-attempt [place] telephone outreach within 4 business days if I do not hear back from patient first.  Oneta Rack, RN, BSN, [place] Coordinator [facility] Care Management  [phone]

## 2019-05-29 ENCOUNTER — Ambulatory Visit: Payer: BC Managed Care – PPO

## 2019-05-29 ENCOUNTER — Encounter: Payer: Self-pay | Admitting: Internal Medicine

## 2019-05-29 ENCOUNTER — Ambulatory Visit
Admission: RE | Admit: 2019-05-29 | Discharge: 2019-05-29 | Disposition: A | Discharge status: Home / Self Care | Payer: BC Managed Care – PPO | Source: Ambulatory Visit | Attending: Neurology | Admitting: Neurology

## 2019-05-29 ENCOUNTER — Other Ambulatory Visit: Payer: Self-pay

## 2019-05-29 DIAGNOSIS — I10 Essential (primary) hypertension: Secondary | ICD-10-CM | POA: Diagnosis not present

## 2019-05-29 DIAGNOSIS — I6340 Cerebral infarction due to embolism of unspecified cerebral artery: Secondary | ICD-10-CM | POA: Insufficient documentation

## 2019-05-29 DIAGNOSIS — R4587 Impulsiveness: Secondary | ICD-10-CM | POA: Diagnosis not present

## 2019-05-29 DIAGNOSIS — E785 Hyperlipidemia, unspecified: Secondary | ICD-10-CM | POA: Diagnosis not present

## 2019-05-29 DIAGNOSIS — R41840 Attention and concentration deficit: Secondary | ICD-10-CM | POA: Diagnosis not present

## 2019-05-29 MED ORDER — GADOBUTROL 1 MMOL/ML IV SOLN
10.0000 mL | Freq: Once | INTRAVENOUS | Status: AC | PRN
  Administered 2019-05-29: 10 mL via INTRAVENOUS

## 2019-05-30 ENCOUNTER — Ambulatory Visit: Payer: BLUE CROSS/BLUE SHIELD | Admitting: Speech Pathology

## 2019-05-30 ENCOUNTER — Encounter: Payer: Self-pay | Admitting: *Deleted

## 2019-05-30 ENCOUNTER — Other Ambulatory Visit: Payer: Self-pay | Admitting: *Deleted

## 2019-05-30 NOTE — Patient Outreach (Signed)
[facility]) Care Management [facility] CM Case Closure outreach 05/30/2019  [place]ane 1956/03/13 [id number]  Successful telephone outreach to Jesse Black, wife/ caregiver, on [facility] DPR from 01/30/2019, for patient Jesse Black, 63 y/o malereferred to [place] Management todayby inpatient RN CM at [facility], when IP RN CM was contacted by patient's wife, who described behavior changes in patient and requested assistance in managing patient's care at home. Patient has history including, but not limited to, recent CVA January 24, 2019- patient was discharged home home to self care on January 25, 2019 after refusing to remain in hospital; HTN/ HLD; pre-diabetes; (R) carotid artery stenosis.  Since referral, patient has been followed by [place] under stroke service contract.  HIPAA/ identity verified with patient's spouse; pleasant 20 minute phone call.  Today, wife/ caregiver Jesse Black reports things "are still going much better;" and she denies that patient is in pain or had new/ recent falls.  Reports ongoing improved communication with patient's care providers, however, she described an incident she experienced last week around scheduling patient's recent MRI, which she was able to resolve, but caused her "a whole lot of stress."  Spouse states that she is currently with patient during phone call.  Caregiver further reports:  -- Has all medicationsandcontinues taking as prescribed; wife continues assisting in medication preparation and patient takes independently; denies new/ recent changes to patient's medications -- has attended all recent provider appointments, including loop recorder insertion on May 22, 2019 and follow up cardiology appointment yesterday -- attended MRI yesterday -- has scheduled psychiatry appointment; unable to remember exact date of appointment, as she is currently driving- verbalizes plans to attend this appointment: states patient  has continued occasionally drinking alcohol without her knowledge, which concerns her; I again encouraged her to communicate her concerns around this with patient's care providers -- wife continues to provide transportation to all provider appointments; denies transportation concerns -- continues monitoring and recording blood pressures "at least weekly;" reports blood pressure readings have been under control -- spouse is able to independently verbalize signs/ symptoms CVA/ MI along with corresponding action plan for same --  Independently verbalizes overall plan of care/ action plan for patient care needs- states that she has "learned so much," since [place] referral, and verbalizes "main plan" to keep care providers aware of patient needs, along with prompt communication when there are any new concerns/ issues/ problems.  Today, she denies ongoing care coordination or disease management needs.  Caregiverdenies further issues, concerns, or problems today. We again discussed that she and patient have thus far met their previously established goals, and she is in agreement with [place] case closure today. Confirmed thatcaregiverhasmy direct phone number, the main [facility] CM office phone number, and the [facility] CM 24-hour nurse advice phone number should issues arise in the future.   Plan:  Will close short term care management program [facility] service contract as patient/ caregiver have succesfully met previously established goals and will make patient's PCP aware of same.   [facility] CM Care Plan Problem One     Most Recent Value  Care Plan Problem One  Need for caregiver action plan around ongoing management of recent CVA in patient who is unreceptive, as evidenced by caregiver reporting  Role Documenting the Problem One  Care Management [place] for Problem One  Not Active  [facility] Long Term Goal   Over the next 31 days, patient's will caregiver will  verbalize action plan for ongoing management of patient's recent CVA and subsequent behavioral changes, as evidenced by patient caregiver reporting during [place] outreach   [place] Term Goal Start Date  05/03/19  [facility] Long Term Goal Met Date  05/30/19 [facility] Met]  Interventions for Problem One Long Term Goal  Discussed with caregiver current plan of care and confirmed that she has action plan moving forward to address concerns around patient's post-hospital discharge plan  [facility] CM Short Term Goal #1   Over the next 30 days, patient will attend all scheduled provider appointments, as evidenced  by caregiver reporting during [facility] RN CCM outreach   [facility] CM Short Term Goal #1 Start Date  05/03/19  [facility] CM Short Term Goal #1 Met Date  05/30/19 [facility] Met]  Interventions for Short Term Goal #1  Reviewed recent and upcoming provider appointments with patient's caregiver,  confirmed that patient has attended all scheduled provider appointments and denies transportation issues  [facility] CM Short Term Goal #2   Over the next 30 days, caregiver will verbalize signs/ symptoms CVA along with corresponding action plan for same, as evidenced by caregiver reporting during [facility] RN CCM outreach  [facility] CM Short Term Goal #2 Start Date  05/03/19  [facility] CM Short Term Goal #2 Met Date  05/30/19 [Goal Met]  Interventions for Short Term Goal #2  Reviewed with caregiver/ spouse signs/ symptoms CVA and confirmed that she is able to independently verbalize signs/ symptoms CVA/ MI, along with corresponding action plan for both     It has been a pleasure caring for Jesse Black, Sanluis Jamse Arn, RN, BSN, [place] Coordinator [facility] Care Management  [phone]

## 2019-06-04 ENCOUNTER — Ambulatory Visit: Payer: BLUE CROSS/BLUE SHIELD | Admitting: Speech Pathology

## 2019-06-06 ENCOUNTER — Ambulatory Visit: Payer: BLUE CROSS/BLUE SHIELD | Admitting: Speech Pathology

## 2019-06-07 DIAGNOSIS — I6340 Cerebral infarction due to embolism of unspecified cerebral artery: Secondary | ICD-10-CM | POA: Diagnosis not present

## 2019-06-07 DIAGNOSIS — R4189 Other symptoms and signs involving cognitive functions and awareness: Secondary | ICD-10-CM | POA: Diagnosis not present

## 2019-06-11 ENCOUNTER — Other Ambulatory Visit: Payer: Self-pay | Admitting: Internal Medicine

## 2019-06-11 ENCOUNTER — Ambulatory Visit: Payer: BLUE CROSS/BLUE SHIELD | Admitting: Speech Pathology

## 2019-06-11 DIAGNOSIS — R413 Other amnesia: Secondary | ICD-10-CM | POA: Diagnosis not present

## 2019-06-11 MED ORDER — OMEGA-3-ACID ETHYL ESTERS 1 G PO CAPS: 1.0000 g | ORAL_CAPSULE | Freq: Every day | ORAL | 3 refills | Status: DC

## 2019-06-13 ENCOUNTER — Ambulatory Visit: Payer: BLUE CROSS/BLUE SHIELD | Admitting: Speech Pathology

## 2019-06-17 ENCOUNTER — Ambulatory Visit: Payer: BC Managed Care – PPO

## 2019-06-18 ENCOUNTER — Encounter: Payer: Self-pay | Admitting: Internal Medicine

## 2019-06-19 ENCOUNTER — Encounter: Payer: Self-pay | Admitting: Internal Medicine

## 2019-06-19 DIAGNOSIS — G4733 Obstructive sleep apnea (adult) (pediatric): Secondary | ICD-10-CM | POA: Diagnosis not present

## 2019-06-20 ENCOUNTER — Encounter: Payer: Self-pay | Admitting: Internal Medicine

## 2019-06-24 ENCOUNTER — Telehealth: Payer: Self-pay | Admitting: Internal Medicine

## 2019-06-24 DIAGNOSIS — E119 Type 2 diabetes mellitus without complications: Secondary | ICD-10-CM

## 2019-06-24 DIAGNOSIS — Z0100 Encounter for examination of eyes and vision without abnormal findings: Secondary | ICD-10-CM

## 2019-06-24 DIAGNOSIS — H5789 Other specified disorders of eye and adnexa: Secondary | ICD-10-CM

## 2019-06-24 NOTE — Telephone Encounter (Signed)
Please call pt appt with [facility] for July 21st at 9:45    Reason: baseline check for diabetes in the eye please make this appt    Thanks [place]

## 2019-06-24 NOTE — Telephone Encounter (Signed)
Call pt about eye exam already sch 07/02/19 9:45 am and placed referral  Thanks [place]

## 2019-06-25 DIAGNOSIS — I69398 Other sequelae of cerebral infarction: Secondary | ICD-10-CM | POA: Diagnosis not present

## 2019-06-25 DIAGNOSIS — I6340 Cerebral infarction due to embolism of unspecified cerebral artery: Secondary | ICD-10-CM | POA: Diagnosis not present

## 2019-06-25 DIAGNOSIS — R4587 Impulsiveness: Secondary | ICD-10-CM | POA: Diagnosis not present

## 2019-06-25 DIAGNOSIS — G4733 Obstructive sleep apnea (adult) (pediatric): Secondary | ICD-10-CM | POA: Diagnosis not present

## 2019-06-26 NOTE — Telephone Encounter (Signed)
lvm to rtc

## 2019-07-01 DIAGNOSIS — F0281 Dementia in other diseases classified elsewhere with behavioral disturbance: Secondary | ICD-10-CM | POA: Diagnosis not present

## 2019-07-01 DIAGNOSIS — G309 Alzheimer's disease, unspecified: Secondary | ICD-10-CM | POA: Diagnosis not present

## 2019-07-02 ENCOUNTER — Ambulatory Visit (HOSPITAL_COMMUNITY): Payer: Self-pay | Admitting: Psychiatry

## 2019-07-02 DIAGNOSIS — H5213 Myopia, bilateral: Secondary | ICD-10-CM | POA: Diagnosis not present

## 2019-07-04 ENCOUNTER — Other Ambulatory Visit: Payer: Self-pay | Admitting: Internal Medicine

## 2019-07-04 MED ORDER — AMLODIPINE BESYLATE 5 MG PO TABS: 5.0000 mg | ORAL_TABLET | Freq: Every day | ORAL | 3 refills | Status: AC

## 2019-07-12 ENCOUNTER — Ambulatory Visit (HOSPITAL_COMMUNITY): Payer: Self-pay | Admitting: Psychiatry

## 2019-07-20 DIAGNOSIS — G4733 Obstructive sleep apnea (adult) (pediatric): Secondary | ICD-10-CM | POA: Diagnosis not present

## 2019-07-30 DIAGNOSIS — I639 Cerebral infarction, unspecified: Secondary | ICD-10-CM | POA: Diagnosis not present

## 2019-08-13 ENCOUNTER — Other Ambulatory Visit: Payer: BC Managed Care – PPO

## 2019-08-14 ENCOUNTER — Other Ambulatory Visit: Payer: Self-pay

## 2019-08-14 ENCOUNTER — Other Ambulatory Visit (INDEPENDENT_AMBULATORY_CARE_PROVIDER_SITE_OTHER): Payer: BC Managed Care – PPO

## 2019-08-14 ENCOUNTER — Telehealth: Payer: Self-pay | Admitting: *Deleted

## 2019-08-14 ENCOUNTER — Other Ambulatory Visit: Payer: BC Managed Care – PPO

## 2019-08-14 DIAGNOSIS — E119 Type 2 diabetes mellitus without complications: Secondary | ICD-10-CM | POA: Diagnosis not present

## 2019-08-14 DIAGNOSIS — I10 Essential (primary) hypertension: Secondary | ICD-10-CM | POA: Diagnosis not present

## 2019-08-14 DIAGNOSIS — E559 Vitamin D deficiency, unspecified: Secondary | ICD-10-CM | POA: Diagnosis not present

## 2019-08-14 DIAGNOSIS — Z23 Encounter for immunization: Secondary | ICD-10-CM

## 2019-08-14 NOTE — Telephone Encounter (Signed)
Copied from [place] [id number]. Topic: General - Inquiry >> Aug 14, 2019  3:19 PM [place], Clois Comber D wrote: Reason for CRM: Pt's wife would like to know if there is a way to check pt's blood alcohol level to see if he has had any in his system. Pt scheduled for labs today. Would prefer for pt not to know. Please advise. CB#[phone]

## 2019-08-14 NOTE — Telephone Encounter (Signed)
I dont think I can do this without the patient being informed of this being done Inform wife not patient  Jesse Black

## 2019-08-15 LAB — LIPID PANEL
Cholesterol: 131 mg/dL (ref 0–200)
HDL: 36.2 mg/dL — ABNORMAL LOW (ref >39.00)
LDL Cholesterol: 59 mg/dL (ref 0–99)
NonHDL: 94.78
Total CHOL/HDL Ratio: 4
Triglycerides: 178 mg/dL — ABNORMAL HIGH (ref 0.0–149.0)
VLDL: 35.6 mg/dL (ref 0.0–40.0)

## 2019-08-15 LAB — COMPREHENSIVE METABOLIC PANEL
ALT: 33 U/L (ref 0–53)
AST: 25 U/L (ref 0–37)
Albumin: 4.7 g/dL (ref 3.5–5.2)
Alkaline Phosphatase: 62 U/L (ref 39–117)
BUN: 32 mg/dL — ABNORMAL HIGH (ref 6–23)
CO2: 20 mEq/L (ref 19–32)
Calcium: 10.3 mg/dL (ref 8.4–10.5)
Chloride: 94 mEq/L — ABNORMAL LOW (ref 96–112)
Creatinine, Ser: 2.88 mg/dL — ABNORMAL HIGH (ref 0.40–1.50)
GFR: 22.25 mL/min — ABNORMAL LOW (ref >60.00)
Glucose, Bld: 124 mg/dL — ABNORMAL HIGH (ref 70–99)
Potassium: 4.2 mEq/L (ref 3.5–5.1)
Sodium: 134 mEq/L — ABNORMAL LOW (ref 135–145)
Total Bilirubin: 1.3 mg/dL — ABNORMAL HIGH (ref 0.2–1.2)
Total Protein: 7.4 g/dL (ref 6.0–8.3)

## 2019-08-15 LAB — CBC WITH DIFFERENTIAL/PLATELET
Basophils Absolute: 0 10*3/uL (ref 0.0–0.1)
Basophils Relative: 0.4 % (ref 0.0–3.0)
Eosinophils Absolute: 0.1 10*3/uL (ref 0.0–0.7)
Eosinophils Relative: 1.2 % (ref 0.0–5.0)
HCT: 42.9 % (ref 39.0–52.0)
Hemoglobin: 14.8 g/dL (ref 13.0–17.0)
Lymphocytes Relative: 18.4 % (ref 12.0–46.0)
Lymphs Abs: 2.1 10*3/uL (ref 0.7–4.0)
MCHC: 34.5 g/dL (ref 30.0–36.0)
MCV: 95.7 fl (ref 78.0–100.0)
Monocytes Absolute: 1 10*3/uL (ref 0.1–1.0)
Monocytes Relative: 9.2 % (ref 3.0–12.0)
Neutro Abs: 8 10*3/uL — ABNORMAL HIGH (ref 1.4–7.7)
Neutrophils Relative %: 70.8 % (ref 43.0–77.0)
Platelets: 321 10*3/uL (ref 150.0–400.0)
RBC: 4.48 Mil/uL (ref 4.22–5.81)
RDW: 13.4 % (ref 11.5–15.5)
WBC: 11.2 10*3/uL — ABNORMAL HIGH (ref 4.0–10.5)

## 2019-08-15 LAB — MICROALBUMIN / CREATININE URINE RATIO
Creatinine, Urine: 202 mg/dL (ref 20–320)
Microalb Creat Ratio: 5 mcg/mg creat (ref <30)
Microalb, Ur: 1.1 mg/dL

## 2019-08-15 LAB — VITAMIN D 25 HYDROXY (VIT D DEFICIENCY, FRACTURES): VITD: 68.18 ng/mL (ref 30.00–100.00)

## 2019-08-15 LAB — HEMOGLOBIN A1C: Hgb A1c MFr Bld: 6.4 % (ref 4.6–6.5)

## 2019-08-15 NOTE — Telephone Encounter (Signed)
Patients wife was informed. She wants to let Dr Olivia Mackie know that patient was riding bike all day and wasn't letting wife know where he is. She wants to know if his medications are making him different? He came walking back home pushing his bike at 10pm. She wants PCP to call her back.  Shes very distraught and does not know what to do. She feels she should take his bike. Also she is heading out of town during lunch to eat with a friend. shes has to leave.

## 2019-08-15 NOTE — Telephone Encounter (Signed)
There is nothing I can help to do unfortunately  Blood pressure medications or PCP meds should not be causing this erratic behavior   This needs to be handled by neurology and psychiatry   [place]

## 2019-08-16 ENCOUNTER — Other Ambulatory Visit: Payer: Self-pay | Admitting: Internal Medicine

## 2019-08-16 DIAGNOSIS — Z113 Encounter for screening for infections with a predominantly sexual mode of transmission: Secondary | ICD-10-CM

## 2019-08-16 DIAGNOSIS — F29 Unspecified psychosis not due to a substance or known physiological condition: Secondary | ICD-10-CM

## 2019-08-16 DIAGNOSIS — F1010 Alcohol abuse, uncomplicated: Secondary | ICD-10-CM

## 2019-08-16 DIAGNOSIS — N179 Acute kidney failure, unspecified: Secondary | ICD-10-CM

## 2019-08-16 DIAGNOSIS — I639 Cerebral infarction, unspecified: Secondary | ICD-10-CM | POA: Diagnosis not present

## 2019-08-19 DIAGNOSIS — I639 Cerebral infarction, unspecified: Secondary | ICD-10-CM | POA: Diagnosis not present

## 2019-08-20 ENCOUNTER — Ambulatory Visit: Payer: BC Managed Care – PPO | Admitting: Internal Medicine

## 2019-08-20 DIAGNOSIS — G4733 Obstructive sleep apnea (adult) (pediatric): Secondary | ICD-10-CM | POA: Diagnosis not present

## 2019-08-23 ENCOUNTER — Telehealth: Payer: Self-pay

## 2019-08-23 NOTE — Telephone Encounter (Signed)
Copied from [place] [id number]. Topic: Referral - Request for Referral >> Aug 23, 2019  1:42 PM Erick Blinks wrote: Has patient seen PCP for this complaint? Yes.   *If NO, is insurance requiring patient see PCP for this issue before PCP can refer them? Referral for which specialty: Neuro-Psychology  Preferred provider/office: [facility]  Reason for referral: Behavioral issues, since stroke in February.   Best contact: [phone] -please contact wife mrs. Jackelyn Knife

## 2019-08-26 ENCOUNTER — Other Ambulatory Visit: Payer: Self-pay | Admitting: Internal Medicine

## 2019-08-26 DIAGNOSIS — R4689 Other symptoms and signs involving appearance and behavior: Secondary | ICD-10-CM

## 2019-08-26 DIAGNOSIS — I639 Cerebral infarction, unspecified: Secondary | ICD-10-CM

## 2019-08-26 NOTE — Telephone Encounter (Signed)
Referral placed  Call wife [name] she may have the phone # to [facility] neuropsychology for behavioral issues post stroke 01/2019 and erratic behavior  Reason for referral   Jesse Black

## 2019-08-27 ENCOUNTER — Telehealth: Payer: Self-pay | Admitting: Internal Medicine

## 2019-08-27 ENCOUNTER — Telehealth: Payer: Self-pay

## 2019-08-27 NOTE — Telephone Encounter (Signed)
Copied from [place] [phone]. Topic: General - Other >> Aug 27, 2019 10:54 AM Leward Quan A wrote: Reason for CRM: Patient wife called asking for Jesse Black to give her a call back when ever possible. She wanted to inform that he has not taken any of his night medication for the past 2 days. She also say that he gets really angry when she remind him to take them. Also say that he is still drinking alcohol but say that she is not sure if he is aware that he is drinking. Also that he will be seeing the Neurologist on 08/28/2019. Wife can be reached at Ph#  [phone]

## 2019-08-27 NOTE — Telephone Encounter (Signed)
Call wife   I cant do anything about his alcohol or erratic behavior unfortunately but refer to psychology/psychiatry/ neurology  If she wants list of resources to alcoholism we can mail them to her I.e [place] [phone] [facility] [phone] or [facility] in [place] [phone] or [phone]

## 2019-08-27 NOTE — Telephone Encounter (Signed)
Labs 08/14/2019 my chart message was sent to patient on 08/16/2019 and and or wife should have been called with abnormal results lab needs to be repeated this week please schedule     He appears very dehydrated based on his kidney labs which increased from 1.29 to 2.88 and BUN elevated from 16 to 32  -avoid all NSAIDS, reduce/stop alcohol if he is drinking  -increase water intake so 64 ounces of water daily   -stop his benicar-hydrochlorothiazide for 2 weeks   -monitor his blood pressure goal <130/<80    -schedule a repeat lab visit 08/27/19 non fasting in our clinic to check kidney function again     A1C improved from 6.5 diabetes to now prediabetes  -continue healthy diet and exercise   White blood cell count slightly elevated this could be reactive due to physical Stressors I.e riding his bike for a long distance but is he feeling well this can also be elevated with infections?   Cholesterol improved continue healthy diet and exercise   Vitamin D now normal continue low dose vitamin D3 daily 2000 IU D3 daily over the counter  Urine normal

## 2019-08-28 DIAGNOSIS — R4689 Other symptoms and signs involving appearance and behavior: Secondary | ICD-10-CM | POA: Diagnosis not present

## 2019-08-28 DIAGNOSIS — I69398 Other sequelae of cerebral infarction: Secondary | ICD-10-CM | POA: Diagnosis not present

## 2019-08-28 DIAGNOSIS — F0281 Dementia in other diseases classified elsewhere with behavioral disturbance: Secondary | ICD-10-CM | POA: Diagnosis not present

## 2019-08-28 DIAGNOSIS — I6340 Cerebral infarction due to embolism of unspecified cerebral artery: Secondary | ICD-10-CM | POA: Diagnosis not present

## 2019-08-28 NOTE — Telephone Encounter (Signed)
Left message for patient to return call back. PEC may give information.  

## 2019-08-28 NOTE — Telephone Encounter (Signed)
Called patient left message to call office  [employer] nurse may advise and schedule lab.

## 2019-08-29 ENCOUNTER — Ambulatory Visit: Payer: BC Managed Care – PPO | Admitting: Internal Medicine

## 2019-08-29 ENCOUNTER — Telehealth: Payer: Self-pay

## 2019-08-29 NOTE — Telephone Encounter (Signed)
Patient's wife, Katharine Look said that pt saw the neurologist yesterday.  Katharine Look said that patient is drinking but not remembering that he is drinking.  Patient wanted neurologist to do lab work to check patient's liver to see if there is evidence of chronic alcohol use.  Katharine Look said that the neurologist stated that he could not test pt for alcohol levels without patient's consent.  Patient wants to know if Dr. Aundra Dubin will order labs to check patient's liver for alcohol use.  Katharine Look, pt's wife said that she is patient's Power of Attorney.  Katharine Look rescheduled patient's missed appt from today to 10/02/19 @ 2:00 pm.

## 2019-09-02 ENCOUNTER — Telehealth: Payer: Self-pay | Admitting: Internal Medicine

## 2019-09-02 ENCOUNTER — Other Ambulatory Visit: Payer: Self-pay | Admitting: Internal Medicine

## 2019-09-02 NOTE — Telephone Encounter (Signed)
He needs to schedule repeat labs asap   He needs appt with psychiatry asap, do they want me to go ahead and place referral?    Stop hydralazine and other meds noted on prior message    Inform wife   [place]

## 2019-09-02 NOTE — Telephone Encounter (Signed)
Call patient and his wife about if needed 2 separate calls to relay info below  1. Prior message below  2. Lab appt needed asap for blood in urine  -lab appt for repeat kidney function still not scheduled ! 3. Also wondering if he can stop benicar hctz or did he stop already x 3 weeks  4. Also I want him to STOP Hydralazine 25 mg 3x per day there have been small case reports this can affect MOOD  5. Are they also agreeable to see psychiatry he is set up with a therapist only but needs a psychiatrist?    Labs 08/14/2019 my chart message was sent to patient on 08/16/2019 and and or wife should have been called with abnormal results lab needs to be repeated this week please schedule     He appears very dehydrated based on his kidney labs which increased from 1.29 to 2.88 and BUN elevated from 16 to 32  -avoid all NSAIDS, reduce/stop alcohol if he is drinking  -increase water intake so 64 ounces of water daily   -stop his benicar-hydrochlorothiazide for 2 weeks   -monitor his blood pressure goal <130/<80    -schedule a repeat lab visit 08/27/19 non fasting in our clinic to check kidney function again     A1C improved from 6.5 diabetes to now prediabetes  -continue healthy diet and exercise   White blood cell count slightly elevated this could be reactive due to physical Stressors I.e riding his bike for a long distance but is he feeling well this can also be elevated with infections?   Cholesterol improved continue healthy diet and exercise   Vitamin D now normal continue low dose vitamin D3 daily 2000 IU D3 daily over the counter  Urine normal

## 2019-09-02 NOTE — Telephone Encounter (Signed)
Pt's wife calling, expressing frustration, multiple questions and concerns [place] not able to answer . States had appt scheduled for this past Thursday but never heard anything. Questioning why he needs labs. States they do not want to see a psychiatrist as she has already spoke to one who stated they would only give more medications. States pt is drinking more and has no memory of it.  States she was only able to see results on MyChart recently and pt has been taking hydralazine up to 2 days ago. Also frustrated as neurologist took him off zoloft and "There was no record of him being on that." Wife states "I feel like we are getting the run around. I am very disappointed." Call transferred to practice as determined best resource to assist further. Call transferred to [place].

## 2019-09-02 NOTE — Telephone Encounter (Signed)
Patients wife is going to bring in another POA during her appointment tomorrow. She will also schedule lab appointment. Husband canceled all appointments with out her knowing. She asked for a note into chart to inform her if he calls and cancels another. She is very stressed out because patient has not been himself and lashing out. She was informed of the results and understands. She was also informed by rasheeda.

## 2019-09-02 NOTE — Telephone Encounter (Signed)
Left message for patients wife to return call back. PEC may give results and obtain information.

## 2019-09-02 NOTE — Addendum Note (Signed)
Addended by: Orland Mustard on: 09/02/2019 02:28 PM   Modules accepted: Orders

## 2019-09-02 NOTE — Telephone Encounter (Signed)
Call patients wife   if he is acting this abnormal as far as behavior she can always take him to the behavioral unit at [facility], call 911 to have him involuntarily committed for mental health reasons especially if she feels like he is a danger to hiimself or others  He also needs to see psychiatry, does she want a referral ?   [place]

## 2019-09-02 NOTE — Telephone Encounter (Signed)
Left message for patients wife to return call back. PEC may give results and obtain information.  

## 2019-09-03 ENCOUNTER — Other Ambulatory Visit: Payer: Self-pay | Admitting: Internal Medicine

## 2019-09-03 DIAGNOSIS — F1910 Other psychoactive substance abuse, uncomplicated: Secondary | ICD-10-CM

## 2019-09-03 DIAGNOSIS — F29 Unspecified psychosis not due to a substance or known physiological condition: Secondary | ICD-10-CM

## 2019-09-03 DIAGNOSIS — I639 Cerebral infarction, unspecified: Secondary | ICD-10-CM

## 2019-09-03 NOTE — Telephone Encounter (Signed)
FYI

## 2019-09-03 NOTE — Telephone Encounter (Signed)
Placed referral to psych Dr. Geanie Kenning [facility] today

## 2019-09-09 ENCOUNTER — Other Ambulatory Visit: Payer: Self-pay

## 2019-09-09 ENCOUNTER — Other Ambulatory Visit (INDEPENDENT_AMBULATORY_CARE_PROVIDER_SITE_OTHER): Payer: BC Managed Care – PPO

## 2019-09-09 ENCOUNTER — Encounter: Payer: Self-pay | Admitting: Internal Medicine

## 2019-09-09 ENCOUNTER — Telehealth: Payer: Self-pay | Admitting: Internal Medicine

## 2019-09-09 DIAGNOSIS — F29 Unspecified psychosis not due to a substance or known physiological condition: Secondary | ICD-10-CM | POA: Diagnosis not present

## 2019-09-09 DIAGNOSIS — N179 Acute kidney failure, unspecified: Secondary | ICD-10-CM | POA: Diagnosis not present

## 2019-09-09 DIAGNOSIS — F1010 Alcohol abuse, uncomplicated: Secondary | ICD-10-CM | POA: Diagnosis not present

## 2019-09-09 DIAGNOSIS — Z113 Encounter for screening for infections with a predominantly sexual mode of transmission: Secondary | ICD-10-CM

## 2019-09-09 NOTE — Telephone Encounter (Signed)
Previously explained to wife he was to stop hydralazine  Hold benicar-hct x 2 weeks but not completely stop  Call the pharmacy d/c hydralazine

## 2019-09-09 NOTE — Telephone Encounter (Signed)
Left detailed message for pharmacy. If they have questions they were instructed to return call back. PEC may give information.

## 2019-09-09 NOTE — Telephone Encounter (Signed)
Jesse Black from pharmacy called to verify if Pt was suppose to still be taking hydrALAZINE (APRESOLINE) 25 MG tablet  And  olmesartan-hydrochlorothiazide (BENICAR HCT) 40-25 MG tablet  The Pts daughter was not sure and wanted to make sure/ please call and advise pharmacy if these are to be continued or not.

## 2019-09-10 ENCOUNTER — Encounter: Payer: Self-pay | Admitting: Internal Medicine

## 2019-09-10 ENCOUNTER — Other Ambulatory Visit (HOSPITAL_COMMUNITY): Payer: Self-pay | Admitting: Interventional Radiology

## 2019-09-10 DIAGNOSIS — I639 Cerebral infarction, unspecified: Secondary | ICD-10-CM

## 2019-09-10 LAB — URINE DRUG PANEL 7
Amphetamines, Urine: NEGATIVE ng/mL
Barbiturate Quant, Ur: NEGATIVE ng/mL
Benzodiazepine Quant, Ur: NEGATIVE ng/mL
Cannabinoid Quant, Ur: NEGATIVE ng/mL
Cocaine (Metab.): NEGATIVE ng/mL
Opiate Quant, Ur: NEGATIVE ng/mL
PCP Quant, Ur: NEGATIVE ng/mL

## 2019-09-10 LAB — SODIUM, URINE, RANDOM: Sodium, Ur: 100 mmol/L

## 2019-09-11 ENCOUNTER — Encounter: Payer: Self-pay | Admitting: Internal Medicine

## 2019-09-11 ENCOUNTER — Telehealth: Payer: Self-pay | Admitting: Internal Medicine

## 2019-09-11 ENCOUNTER — Other Ambulatory Visit: Payer: Self-pay | Admitting: Internal Medicine

## 2019-09-11 NOTE — Telephone Encounter (Signed)
Pharmacy has been called.

## 2019-09-11 NOTE — Telephone Encounter (Signed)
Richardson Landry with [employer] is calling in to request to have faxed instead of a phone call so that he will have the documentation.   Please fax at : Maryella Shivers   Fax: [phone]

## 2019-09-11 NOTE — Telephone Encounter (Signed)
Pharmacy calling to request patient's updated medication list.    Pharmacy:  [place], [place] [facility] [place] [phone] (Phone) [phone] (Fax)

## 2019-09-11 NOTE — Telephone Encounter (Signed)
Call pharmacy and d/c hydralazine   [place]

## 2019-09-11 NOTE — Telephone Encounter (Signed)
List has been sent to pharmacy electronically.

## 2019-09-13 LAB — BASIC METABOLIC PANEL
BUN/Creatinine Ratio: 14 (ref 10–24)
BUN: 18 mg/dL (ref 8–27)
CO2: 24 mmol/L (ref 20–29)
Calcium: 9.5 mg/dL (ref 8.6–10.2)
Chloride: 101 mmol/L (ref 96–106)
Creatinine, Ser: 1.29 mg/dL — ABNORMAL HIGH (ref 0.76–1.27)
GFR calc Af Amer: 68 mL/min/{1.73_m2} (ref >59)
GFR calc non Af Amer: 59 mL/min/{1.73_m2} — ABNORMAL LOW (ref >59)
Glucose: 205 mg/dL — ABNORMAL HIGH (ref 65–99)
Potassium: 4.4 mmol/L (ref 3.5–5.2)
Sodium: 138 mmol/L (ref 134–144)

## 2019-09-13 LAB — FOLATE: Folate: >20 ng/mL (ref >3.0)

## 2019-09-13 LAB — RPR: RPR Ser Ql: NONREACTIVE

## 2019-09-13 LAB — VITAMIN B1: Thiamine: 150.5 nmol/L (ref 66.5–200.0)

## 2019-09-13 LAB — ETHANOL

## 2019-09-19 DIAGNOSIS — G4733 Obstructive sleep apnea (adult) (pediatric): Secondary | ICD-10-CM | POA: Diagnosis not present

## 2019-09-20 ENCOUNTER — Encounter: Payer: Self-pay | Admitting: Internal Medicine

## 2019-09-20 DIAGNOSIS — I6340 Cerebral infarction due to embolism of unspecified cerebral artery: Secondary | ICD-10-CM | POA: Diagnosis not present

## 2019-09-20 DIAGNOSIS — E785 Hyperlipidemia, unspecified: Secondary | ICD-10-CM | POA: Diagnosis not present

## 2019-09-20 DIAGNOSIS — I10 Essential (primary) hypertension: Secondary | ICD-10-CM | POA: Diagnosis not present

## 2019-09-24 ENCOUNTER — Encounter: Payer: Self-pay | Admitting: Internal Medicine

## 2019-09-29 ENCOUNTER — Encounter: Payer: Self-pay | Admitting: Internal Medicine

## 2019-09-30 ENCOUNTER — Other Ambulatory Visit: Payer: Self-pay

## 2019-10-01 ENCOUNTER — Ambulatory Visit (HOSPITAL_COMMUNITY)
Admission: RE | Admit: 2019-10-01 | Discharge: 2019-10-01 | Disposition: A | Discharge status: Home / Self Care | Payer: BC Managed Care – PPO | Source: Ambulatory Visit | Attending: Interventional Radiology | Admitting: Interventional Radiology

## 2019-10-01 ENCOUNTER — Encounter: Payer: Self-pay | Admitting: Internal Medicine

## 2019-10-01 DIAGNOSIS — I639 Cerebral infarction, unspecified: Secondary | ICD-10-CM | POA: Insufficient documentation

## 2019-10-01 DIAGNOSIS — Z8673 Personal history of transient ischemic attack (TIA), and cerebral infarction without residual deficits: Secondary | ICD-10-CM | POA: Diagnosis not present

## 2019-10-01 DIAGNOSIS — I6523 Occlusion and stenosis of bilateral carotid arteries: Secondary | ICD-10-CM | POA: Diagnosis not present

## 2019-10-01 MED ORDER — IOHEXOL 350 MG/ML SOLN
75.0000 mL | Freq: Once | INTRAVENOUS | Status: AC | PRN
  Administered 2019-10-01: 10:00:00 75 mL via INTRAVENOUS

## 2019-10-02 ENCOUNTER — Encounter: Payer: Self-pay | Admitting: Internal Medicine

## 2019-10-02 ENCOUNTER — Ambulatory Visit (INDEPENDENT_AMBULATORY_CARE_PROVIDER_SITE_OTHER): Payer: BC Managed Care – PPO | Admitting: Internal Medicine

## 2019-10-02 ENCOUNTER — Other Ambulatory Visit: Payer: Self-pay

## 2019-10-02 VITALS — BP 110/58 | HR 78 | Temp 98.6°F | Ht 67.0 in | Wt 211.4 lb

## 2019-10-02 DIAGNOSIS — Z1283 Encounter for screening for malignant neoplasm of skin: Secondary | ICD-10-CM

## 2019-10-02 DIAGNOSIS — I10 Essential (primary) hypertension: Secondary | ICD-10-CM

## 2019-10-02 DIAGNOSIS — I672 Cerebral atherosclerosis: Secondary | ICD-10-CM | POA: Diagnosis not present

## 2019-10-02 DIAGNOSIS — N529 Male erectile dysfunction, unspecified: Secondary | ICD-10-CM | POA: Diagnosis not present

## 2019-10-02 DIAGNOSIS — L989 Disorder of the skin and subcutaneous tissue, unspecified: Secondary | ICD-10-CM

## 2019-10-02 DIAGNOSIS — Z0000 Encounter for general adult medical examination without abnormal findings: Secondary | ICD-10-CM

## 2019-10-02 MED ORDER — SILDENAFIL CITRATE 20 MG PO TABS: 20.0000 mg | ORAL_TABLET | Freq: Every day | ORAL | 11 refills | Status: DC | PRN

## 2019-10-02 NOTE — Patient Instructions (Addendum)
Stop hydralazine 3x per day   Results for CLEMENS, LACHMAN (MRN [id number]) as of 10/02/2019 14:41  Ref. Range 08/14/2019 15:10  Hemoglobin A1C Latest Ref Range: 4.6 - 6.5 % 6.4    Increase hydration with water  [place] [facility]  [phone] [place]   Keep up the good work !   10/03/2019 Office Visit Neurology [place], Gara Kroner, [address]  [place], [place] [address]  [phone]  [phone] (Fax)    10/23/2019 Office Visit Neurology Benay Spice, [address] [place], [place] [address]  [phone]  [phone] (Fax)

## 2019-10-02 NOTE — Progress Notes (Signed)
Chief Complaint  Patient presents with  . Follow-up   Annual  1. HTN controlled on norvasc 5 mg qd, benicar 40-25 mg qd, lopressor 25 bid  2. H/o stroke f/u with [facility] and Dr. Corliss Skainseveshwar with CTA head and neck 10/01/2019. Today he denies problems with memory or behavior but has lost his job due to erratic behavior and financial troubles I.e not paying bills per family. He is looking forward to getting his driving test with [employer] 69/62/9512/15/20 and see if he can drive again as he has been riding a bike  Cardiac recorder reviewed Dr. Cassie FreerParachos 06/03/19 no afib just saw 09/20/2019 and due to f/u with cardiology in 6 months   IMPRESSION: 1. Chronic microvascular ischemic change in the white matter. No acute intracranial abnormality. 2. No significant carotid or vertebral artery stenosis in the neck 3. Extensive intracranial atherosclerotic disease. Bilateral atherosclerotic disease in the anterior, middle, and posterior cerebral arteries appear stable from the prior CTA.  3. AKI improved and DM 2 improved to prediabtes a1c 6.4 last labs 09/09/19 and 08/14/2019  4. C/o ED and wants medication for this    Review of Systems  Constitutional: Positive for weight loss.  HENT: Negative for hearing loss.   Eyes: Negative for blurred vision.  Respiratory: Negative for shortness of breath.   Cardiovascular: Negative for chest pain.  Gastrointestinal: Negative for abdominal pain.  Genitourinary:       +ed    Musculoskeletal: Negative for falls.  Skin: Negative for rash.  Neurological: Negative for headaches.  Psychiatric/Behavioral: Negative for depression.   Past Medical History:  Diagnosis Date  . High blood pressure   . Hyperlipidemia   . Memory loss   . Prediabetes   . Stroke (cerebrum) (HCC)   . Stroke [facility](HCC)    Past Surgical History:  Procedure Laterality Date  . COLONOSCOPY WITH PROPOFOL N/A 10/05/2015   Procedure: COLONOSCOPY WITH PROPOFOL;  Surgeon: Scot Junobert T  Elliott, MD;  [facility];  Service: Endoscopy;  Laterality: N/A;  . IR ANGIO INTRA EXTRACRAN SEL COM CAROTID INNOMINATE BILAT MOD SED  02/05/2019  . IR ANGIO VERTEBRAL SEL SUBCLAVIAN INNOMINATE UNI R MOD SED  02/05/2019  . IR ANGIO VERTEBRAL SEL VERTEBRAL UNI L MOD SED  02/05/2019  . IR US GUIDE VASC ACCESS RIGHT  02/05/2019  . LOOP RECORDER INSERTION N/A 05/22/2019   Procedure: LOOP RECORDER INSERTION;  Surgeon: Marcina MillardParaschos, Alexander, MD;  Location: [facility] INVASIVE CV LAB;  Service: Cardiovascular;  Laterality: N/A;   Family History  Problem Relation Age of Onset  . Lung cancer Father   . Dementia Mother    Social History   Socioeconomic History  . Marital status: Married    Spouse name: Not on file  . Number of children: Not on file  . Years of education: Not on file  . Highest education level: Not on file  Occupational History    Employer: [employer]  Social Needs  . Financial resource strain: Not hard at all  . Food insecurity    Worry: Never true    Inability: Never true  . Transportation needs    Medical: No    Non-medical: No  Tobacco Use  . Smoking status: Never Smoker  . Smokeless tobacco: Never Used  Substance and Sexual Activity  . Alcohol use: Yes    Comment: ?amt   . Drug use: No  . Sexual activity: Yes  Lifestyle  . Physical activity    Days per week: Not  on file    Minutes per session: Not on file  . Stress: Not on file  Relationships  . Social Herbalist on phone: Not on file    Gets together: Not on file    Attends religious service: Not on file    Active member of club or organization: Not on file    Attends meetings of clubs or organizations: Not on file    Relationship status: Not on file  . Intimate partner violence    Fear of current or ex partner: Not on file    Emotionally abused: Not on file    Physically abused: Not on file    Forced sexual activity: Not on file  Other Topics Concern  . Not on file  Social History  Narrative   Married    Kids    Current Meds  Medication Sig  . acetaminophen (TYLENOL) 500 MG tablet Take 500 mg by mouth daily as needed for moderate pain or headache.  Marland Kitchen amLODipine (NORVASC) 5 MG tablet Take 1 tablet (5 mg total) by mouth daily.  Marland Kitchen aspirin EC 81 MG tablet Take 81 mg by mouth daily.  Marland Kitchen atorvastatin (LIPITOR) 40 MG tablet Take 1 tablet (40 mg total) by mouth daily at 6 PM.  . calcium carbonate (TUMS - DOSED IN MG ELEMENTAL CALCIUM) 500 MG chewable tablet Chew 1 tablet by mouth daily as needed for indigestion or heartburn.  . Cholecalciferol 1.25 MG (50000 UT) capsule Take 1 capsule (50,000 Units total) by mouth every Sunday.  . clopidogrel (PLAVIX) 75 MG tablet Take 1 tablet (75 mg total) by mouth daily.  . donepezil (ARICEPT) 10 MG tablet Take 10 mg by mouth at bedtime.  . Melatonin 5 MG CAPS Take 5 mg by mouth at bedtime as needed (sleep).  . metoprolol tartrate (LOPRESSOR) 25 MG tablet Take 1 tablet (25 mg total) by mouth 2 (two) times daily.  . Multiple Vitamin (MULTI-VITAMIN DAILY) TABS Take 1 tablet by mouth daily.  . neomycin-polymyxin-hydrocortisone (CORTISPORIN) OTIC solution Place 4 drops into the left ear 4 (four) times daily. x7-10 days (Patient taking differently: Place 4 drops into the left ear 4 (four) times daily as needed (itchiness in the ear). x7-10 days)  . olmesartan-hydrochlorothiazide (BENICAR HCT) 40-25 MG tablet Take 1 tablet by mouth daily. In am  . omega-3 acid ethyl esters (LOVAZA) 1 g capsule Take 1 capsule (1 g total) by mouth daily.  . Tetrahydrozoline HCl (VISINE OP) Place 1 drop into both eyes daily as needed (dry eyes).   No Known Allergies Recent Results (from the past 2160 hour(s))  Microalbumin / creatinine urine ratio     Status: None   Collection Time: 08/14/19  3:10 PM  Result Value Ref Range   Creatinine, Urine 202 20 - 320 mg/dL   Microalb, Ur 1.1 mg/dL    Comment: Reference Range Not established    Microalb Creat Ratio 5 <30  mcg/mg creat    Comment: . The ADA defines abnormalities in albumin excretion as follows: . Category         Result (mcg/mg creatinine) . Normal                    <30 Microalbuminuria         30 -299  Clinical albuminuria   > OR = 300 . The ADA recommends that at least two of three specimens collected within a 3-6 month period be abnormal before considering a patient to  be within a diagnostic category.   Vitamin D (25 hydroxy)     Status: None   Collection Time: 08/14/19  3:10 PM  Result Value Ref Range   VITD 68.18 30.00 - 100.00 ng/mL  Hemoglobin A1c     Status: None   Collection Time: 08/14/19  3:10 PM  Result Value Ref Range   Hgb A1c MFr Bld 6.4 4.6 - 6.5 %    Comment: Glycemic Control Guidelines for People with Diabetes:Non Diabetic:  <6%Goal of Therapy: <7%Additional Action Suggested:  >8%   Lipid panel     Status: Abnormal   Collection Time: 08/14/19  3:10 PM  Result Value Ref Range   Cholesterol 131 0 - 200 mg/dL    Comment: ATP III Classification       Desirable:  < 200 mg/dL               Borderline High:  200 - 239 mg/dL          High:  > = 235 mg/dL   Triglycerides 573.2 (H) 0.0 - 149.0 mg/dL    Comment: Normal:  <202 mg/dLBorderline High:  150 - 199 mg/dL   HDL 54.27 (L) >06.23 mg/dL   VLDL 76.2 0.0 - 83.1 mg/dL   LDL Cholesterol 59 0 - 99 mg/dL   Total CHOL/HDL Ratio 4     Comment:                Men          Women1/2 Average Risk     3.4          3.3Average Risk          5.0          4.42X Average Risk          9.6          7.13X Average Risk          15.0          11.0                       NonHDL 94.78     Comment: NOTE:  Non-HDL goal should be 30 mg/dL higher than patient's LDL goal (i.e. LDL goal of < 70 mg/dL, would have non-HDL goal of < 100 mg/dL)  CBC w/Diff     Status: Abnormal   Collection Time: 08/14/19  3:10 PM  Result Value Ref Range   WBC 11.2 (H) 4.0 - 10.5 K/uL   RBC 4.48 4.22 - 5.81 Mil/uL   Hemoglobin 14.8 13.0 - 17.0 g/dL   HCT 51.7 61.6  - 07.3 %   MCV 95.7 78.0 - 100.0 fl   MCHC 34.5 30.0 - 36.0 g/dL   RDW 71.0 62.6 - 94.8 %   Platelets 321.0 150.0 - 400.0 K/uL   Neutrophils Relative % 70.8 43.0 - 77.0 %   Lymphocytes Relative 18.4 12.0 - 46.0 %   Monocytes Relative 9.2 3.0 - 12.0 %   Eosinophils Relative 1.2 0.0 - 5.0 %   Basophils Relative 0.4 0.0 - 3.0 %   Neutro Abs 8.0 (H) 1.4 - 7.7 K/uL   Lymphs Abs 2.1 0.7 - 4.0 K/uL   Monocytes Absolute 1.0 0.1 - 1.0 K/uL   Eosinophils Absolute 0.1 0.0 - 0.7 K/uL   Basophils Absolute 0.0 0.0 - 0.1 K/uL  Comprehensive metabolic panel     Status: Abnormal   Collection Time: 08/14/19  3:10 PM  Result  Value Ref Range   Sodium 134 (L) 135 - 145 mEq/L   Potassium 4.2 3.5 - 5.1 mEq/L   Chloride 94 (L) 96 - 112 mEq/L   CO2 20 19 - 32 mEq/L   Glucose, Bld 124 (H) 70 - 99 mg/dL   BUN 32 (H) 6 - 23 mg/dL   Creatinine, Ser 1.61 (H) 0.40 - 1.50 mg/dL   Total Bilirubin 1.3 (H) 0.2 - 1.2 mg/dL   Alkaline Phosphatase 62 39 - 117 U/L   AST 25 0 - 37 U/L   ALT 33 0 - 53 U/L   Total Protein 7.4 6.0 - 8.3 g/dL   Albumin 4.7 3.5 - 5.2 g/dL   Calcium 09.6 8.4 - 04.5 mg/dL   GFR 40.98 (L) >11.91 mL/min  Vitamin B1     Status: None   Collection Time: 09/09/19 11:07 AM  Result Value Ref Range   Thiamine 150.5 66.5 - 200.0 nmol/L  Sodium, urine, random     Status: None   Collection Time: 09/09/19 11:07 AM  Result Value Ref Range   Sodium, Ur 100 Not Estab. mmol/L  RPR     Status: None   Collection Time: 09/09/19 11:07 AM  Result Value Ref Range   RPR Ser Ql Non Reactive Non Reactive  Ethanol     Status: None   Collection Time: 09/09/19 11:07 AM  Result Value Ref Range   Ethanol <.010 Cutoff=0.010 %    Comment: This test was developed and its performance characteristics determined by [facility]. It has not been cleared or approved by the Food and Drug Administration.   Folate     Status: None   Collection Time: 09/09/19 11:07 AM  Result Value Ref Range   Folate >20.0 >3.0 ng/mL     Comment: A serum folate concentration of less than 3.1 ng/mL is considered to represent clinical deficiency.   Urine Drug Panel 7     Status: None   Collection Time: 09/09/19 11:07 AM  Result Value Ref Range   Amphetamines, Urine Negative Cutoff=1000 ng/mL    Comment: Amphetamine test includes Amphetamine and Methamphetamine.   Barbiturate Quant, Ur Negative Cutoff=300 ng/mL   Benzodiazepine Quant, Ur Negative Cutoff=300 ng/mL   Cannabinoid Quant, Ur Negative Cutoff=50 ng/mL   Cocaine (Metab.) Negative Cutoff=300 ng/mL   Opiate Quant, Ur Negative Cutoff=300 ng/mL    Comment: Opiate test includes Codeine and Morphine only.   PCP Quant, Ur Negative Cutoff=25 ng/mL  Basic Metabolic Panel (BMET)     Status: Abnormal   Collection Time: 09/09/19 11:07 AM  Result Value Ref Range   Glucose 205 (H) 65 - 99 mg/dL   BUN 18 8 - 27 mg/dL   Creatinine, Ser 4.78 (H) 0.76 - 1.27 mg/dL   GFR calc non Af Amer 59 (L) >59 mL/min/1.73   GFR calc Af Amer 68 >59 mL/min/1.73   BUN/Creatinine Ratio 14 10 - 24   Sodium 138 134 - 144 mmol/L   Potassium 4.4 3.5 - 5.2 mmol/L   Chloride 101 96 - 106 mmol/L   CO2 24 20 - 29 mmol/L   Calcium 9.5 8.6 - 10.2 mg/dL   Objective  Body mass index is 33.11 kg/m. Wt Readings from Last 3 Encounters:  10/02/19 211 lb 6.4 oz (95.9 kg)  02/28/19 214 lb (97.1 kg)  02/05/19 205 lb (93 kg)   Temp Readings from Last 3 Encounters:  10/02/19 98.6 F (37 C)  05/22/19 97.6 F (36.4 C) (Oral)  02/28/19 98.5 F (  36.9 C) (Oral)   BP Readings from Last 3 Encounters:  10/02/19 (!) 110/58  05/22/19 (!) 159/93  02/28/19 (!) 144/78   Pulse Readings from Last 3 Encounters:  10/02/19 78  05/22/19 67  02/28/19 74    Physical Exam Vitals signs and nursing note reviewed.  Constitutional:      Appearance: Normal appearance. He is well-developed and well-groomed. He is obese.  HENT:     Head: Normocephalic and atraumatic.     Comments: +mask on   Eyes:      Conjunctiva/sclera: Conjunctivae normal.     Pupils: Pupils are equal, round, and reactive to light.  Cardiovascular:     Rate and Rhythm: Normal rate and regular rhythm.     Pulses: Normal pulses.     Heart sounds: Normal heart sounds.  Pulmonary:     Effort: Pulmonary effort is normal.     Breath sounds: Normal breath sounds.  Abdominal:     General: Abdomen is flat. Bowel sounds are normal.     Tenderness: There is no abdominal tenderness.  Skin:    General: Skin is warm and moist.  Neurological:     General: No focal deficit present.     Mental Status: He is alert and oriented to person, place, and time. Mental status is at baseline.     Gait: Gait normal.  Psychiatric:        Attention and Perception: Attention and perception normal.        Mood and Affect: Mood and affect normal.        Speech: Speech normal.        Behavior: Behavior normal. Behavior is cooperative.        Thought Content: Thought content normal.        Cognition and Memory: Cognition and memory normal.        Judgment: Judgment normal.     Assessment  Plan  Annual physical exam Flu shotutd tdap utd 06/18/15 Consider pna 23 vaccine  Consider shingrix in future Consider hep A/Bh/o fatty liver   Colonoscopy 10/05/15 Dr. Markham Jordan + polyps bx neg repeat in 10 years  PSA 01/30/19 1.42   Skin cancer screening - Plan: Ambulatory referral to Dermatology Cont healthy diet and exercise   Erectile dysfunction, unspecified erectile dysfunction type - Plan: sildenafil (REVATIO) 20 MG tablet-100 mg daily prn [facility] pt to call   Essential hypertension -cont meds BP controlled   Intracranial atherosclerosis  F/u Dr. Sherryll Burger, [facility] and Dr. Corliss Skains Control risk factors  Driving test post stroke 13/07/6577   Provider: Dr. French Ana McLean-Scocuzza-Internal Medicine

## 2019-10-03 ENCOUNTER — Telehealth (HOSPITAL_COMMUNITY): Payer: Self-pay

## 2019-10-03 NOTE — Telephone Encounter (Signed)
Called pt's wife to inform her to f/u in 6 months. She had multiple questions. Pt's dementia and behavior seems to be getting worse. She has been to see Dr. Manuella Ghazi who feels that the pt is indeed getting worse and is not able to make his own financial and healthcare decisions. Ms. Barret wants to know if Dr. Estanislado Pandy agrees with this. I offered for her to come in for a visit to go over imaging and discuss but she is very worried. The pt has anosognosia and gets very upset when she explains to the doctor his odd behavior. I have sent a message to the PA to discuss with Deveshwar and will call back. Wife agrees with this. AW

## 2019-10-04 ENCOUNTER — Telehealth: Payer: Self-pay

## 2019-10-04 ENCOUNTER — Encounter: Payer: Self-pay | Admitting: Internal Medicine

## 2019-10-04 ENCOUNTER — Telehealth: Payer: Self-pay | Admitting: Student

## 2019-10-04 NOTE — Telephone Encounter (Signed)
Copied from [place] [phone]. Topic: General - Inquiry >> Oct 04, 2019  5:01 PM Alease Frame wrote: Reason for CRM: Patient is asking that wife Ria Comment be removed from designated party release .  himself as the primary contact . No one else

## 2019-10-04 NOTE — Telephone Encounter (Signed)
NIR.  Received message from patient's wife, Katharine Look, requesting a call regarding results of CTA 10/01/2019, specifically if findings indicate dementia: 1. Chronic microvascular ischemic change in the white matter. No acute intracranial abnormality. 2. No significant carotid or vertebral artery stenosis in the neck 3. Extensive intracranial atherosclerotic disease. Bilateral atherosclerotic disease in the anterior, middle, and posterior cerebral arteries appear stable from the prior CTA.  Discussed results with Dr. Estanislado Pandy- findings are stable, and demonstrate chronic vascular ischemia, which can contribute to cognitive difficulties.  Lucrezia Europe at [phone]. Informed her of above. Explained that the diagnosis of dementia needs to be made by a neurologist after careful review of clinical information and studies. Explained that Dr. Estanislado Pandy is not a neurologist, he is a neuro interventional radiologist, therefore he cannot make this diagnosis. Recommend she reach out to patient's neurologist regarding diagnosis/management. Katharine Look requesting CTA report be sent to Dr. Manuella Ghazi (neurologist), will send today.  All questions answered and concerns addressed. Patient's wife thankful for call, conveys understanding, and agrees with plan.   Bea Graff Louk, PA-C 10/04/2019, 4:11 PM

## 2019-10-07 ENCOUNTER — Telehealth: Payer: Self-pay | Admitting: Internal Medicine

## 2019-10-07 NOTE — Telephone Encounter (Signed)
The FYI has been updated , the patient has signed a new DPR that will override the old DPR.

## 2019-10-07 NOTE — Telephone Encounter (Signed)
Richardson Landry calling from [place] is calling for advise regarding medication. Wife is confused regarding medication for Blood Pressure. Wife states that the patient is supposed to stop a medication for BP. But unsure of the name. Please advise CB- [phone]

## 2019-10-08 NOTE — Telephone Encounter (Signed)
Left message for patients wife to return call back. PEC may give information.

## 2019-10-08 NOTE — Telephone Encounter (Signed)
Supposed to d/c hydralazine  Speak with pharmacist and d/c Wife was given lists of meds he should be on and so was patient    Jesse Black

## 2019-10-08 NOTE — Telephone Encounter (Signed)
Pharmacy called to ask the nurse or doctor to specify which BP medication is to be discontinued.  He said that they pack the patient's medication and need to know this as soon as possible.  CB# [phone]

## 2019-10-08 NOTE — Telephone Encounter (Signed)
Pt wife called back and was advised that the hydralazine was discontinued.

## 2019-10-09 NOTE — Telephone Encounter (Signed)
Pharmacist has been informed. He has correct dosage now and correct medications.

## 2019-10-09 NOTE — Telephone Encounter (Signed)
He should be taking this now  Only supposed to hold for 2 weeks and shes been notified of this and patient  Call pharmacist   Only med to stop for long term was hydralazine

## 2019-10-09 NOTE — Telephone Encounter (Signed)
Jesse Black states he is calling concerning the    olmesartan-hydrochlorothiazide (BENICAR HCT) 40-25 MG tablet   He states he got a note back around 09/01/19 that said hold this medication.  This is the one he wants to know about. Not hydralazine. Please call him back, as he states the wife is giving him a hard time.

## 2019-10-14 IMAGING — US IR VERTEBRAL  NON-SELECT UNILAT  RIGHT(MS)
1 series · 1 of 1 positions shown · non-contrast
Comparison: CT angiogram of the head and neck of 01/25/2019, and
MRI of the brain of 01/24/2019.

CLINICAL DATA: Left cerebral hemispheric ischemic strokes. Abnormal
CT angiogram of the head and neck and MRI of the head.

EXAM:
IR ULTRASOUND GUIDANCE VASC ACCESS RIGHT; BILATERAL COMMON CAROTID
AND INNOMINATE ANGIOGRAPHY; IR ANGIO VERTEBRAL SEL SUBCLAVIAN
INNOMINATE UNI RIGHT MOD SED; IR ANGIO VERTEBRAL SEL VERTEBRAL UNI
LEFT MOD SED
TECHNIQUE: Informed written consent was obtained from the patient after a
thorough discussion of the procedural risks, benefits and
alternatives. All questions were addressed. Maximal Sterile Barrier
Technique was utilized including caps, mask, sterile gowns, sterile
gloves, sterile drape, hand hygiene and skin antiseptic. A timeout
was performed prior to the initiation of the procedure.

[Series 1: ir vertebral non-select unilat right(ms) · 1 of 1 slices shown]
[im 1/1]
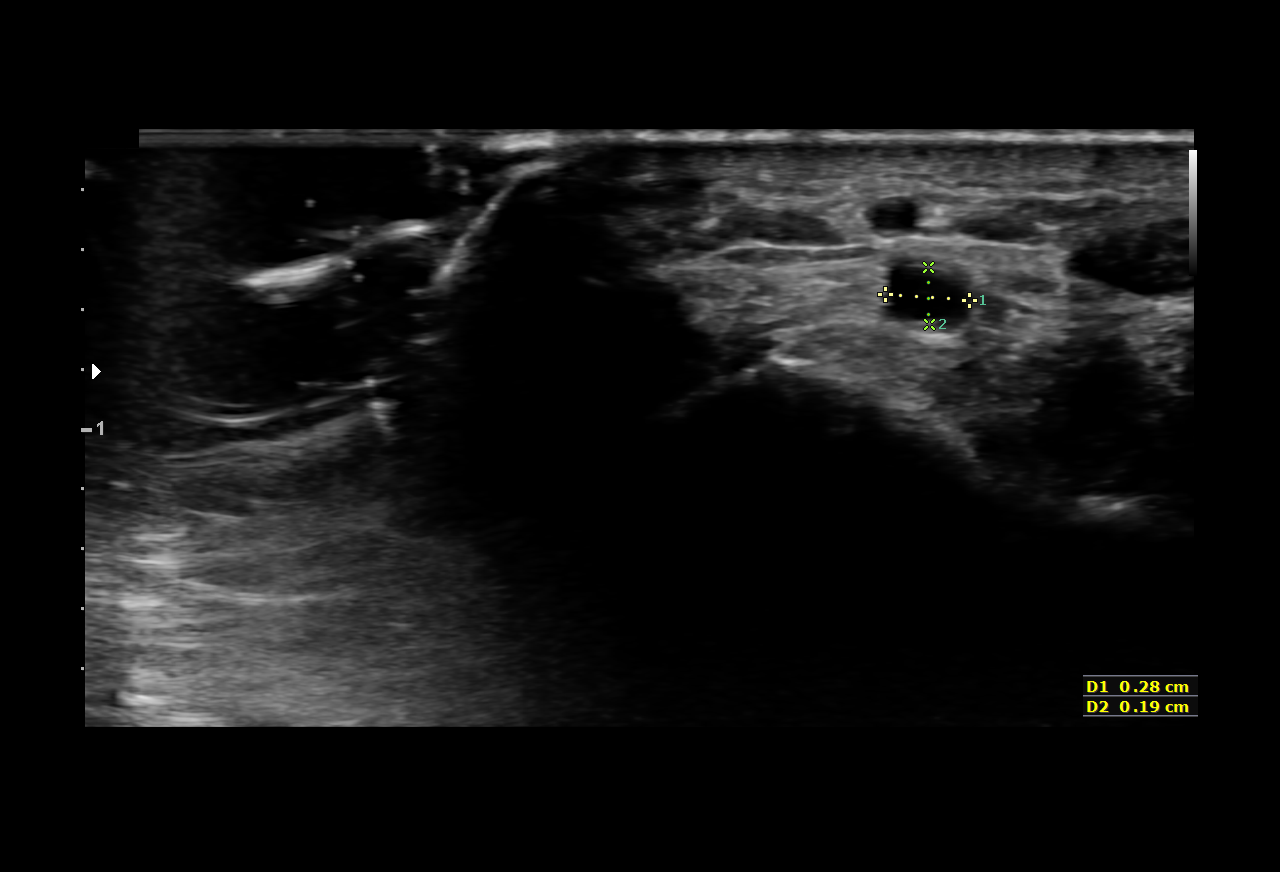

[1 of 1 positions shown; findings below may reference images not displayed]

MEDICATIONS:
Heparin 8333 units IA; no antibiotic was administered within 1 hour
of the procedure.

ANESTHESIA/SEDATION:
Versed 1 mg IV; Fentanyl 25 mcg IV

Moderate Sedation Time:  45 minutes

The patient was continuously monitored during the procedure by the
interventional radiology nurse under my direct supervision.

CONTRAST:  Isovue 300 approximately 60 mL

FLUOROSCOPY TIME:  Fluoroscopy Time: 12 minutes 36 seconds (6674
mGy).

COMPLICATIONS:
None immediate.
The right forearm to the wrist was prepped and draped in the usual
sterile fashion.

The right radial pulse was then identified by ultrasound and
evaluated for dorsal Planojevic anastomosis. Under ultrasound guidance,
transradial access into the right radial artery was obtained without
difficulty. Over a 0.018 inch micro guidewire, a 4 French radial
sheath was then inserted without difficulty. Free aspiration of
blood was noted from the side port of the sheath. The obturator was
removed. A combination of 2.5 mg of verapamil, 8333 units of
heparin, and 100 mcg of nitroglycerin cocktail was then injected
through the sheath in a diluted form without any complications.

An arteriogram was then performed through the sheath of the right
radial artery.

Spasm in the distal end of the sheath was treated with 200 mcg of
nitroglycerin intra-arterially.

Thereafter, over a 0.035 inch Roadrunner guidewire, a 5 Nemchand
Huntal [place] 2 diagnostic catheter was then advanced to the aortic arch
region where the Huntal [place] 2 diagnostic catheter was formed. Select
cannulation and arteriogram was then performed of the left common
carotid artery, the right common carotid artery, the left vertebral
artery, and the right subclavian artery.

Following the procedure, hemostasis in the right radial artery was
obtained with a wrist band. Distal radial pulse was palpable at the
end of the procedure.
FINDINGS: The left common carotid arteriogram demonstrates the left external
carotid artery and its major branches to be widely patent.

The left internal carotid artery at the bulb to the cranial skull
base demonstrates wide patency. The petrous, cavernous and
supraclinoid segments are widely patent.

The left middle cerebral artery and the left anterior cerebral
artery opacify into the capillary and venous phases.

There is approximately 50% stenosis of the left middle cerebral
artery distal M1 segment. Also seen are scattered focal areas of
smooth irregularity and narrowing involving the left pericallosal
artery in its A2 and A3 regions.

The left posterior communicating artery is seen to opacify the left
posterior cerebral artery distribution. There is a focal severe
stenosis of the left posterior cerebral artery P1 P2 junction, and
also P2 P3 branches.

The right common carotid arteriogram demonstrates the right external
carotid artery and its major branches to be widely patent.

The right internal carotid artery at the bulb to the cranial skull
base demonstrates wide patency. The petrous, cavernous and
supraclinoid segments are widely patent.

The right middle cerebral artery and the right anterior cerebral
artery opacify into the capillary and venous phases.

Again demonstrated are moderate areas of caliber irregularity with
stenosis involving the inferior division of the right middle
cerebral artery M2 M3 region, and also the right pericallosal
artery.

The left vertebral artery origin demonstrates mild stenosis. The
vessel, otherwise, opacifies normally to the cranial skull base.

Wide patency is seen of the left vertebrobasilar junction, and the
left posterior-inferior cerebellar artery.

The basilar artery, the right posterior cerebral artery, the
superior cerebellar arteries and the anterior-inferior cerebellar
arteries opacify into the capillary and venous phases.

The right posterior cerebral artery demonstrates a moderate severe
stenosis of the right PC in the P1 P2 junction.

The right vertebral artery is hypoplastic which ascends to the
cranial skull base and supplies primarily the right
posterior-inferior cerebellar artery.
IMPRESSION: Approximately 50% stenosis of the left middle cerebral artery distal
M1 segment.

Moderate to severe stenosis of both posterior cerebral arteries in
the P1 P2 regions, and P2 P3 regions worse on the left side.

Mild stenosis at the origin of the left vertebral artery.

Moderate to moderately severe diffuse intracranial arteriosclerotic
changes involving the pericallosal arteries, and inferior division
of the right middle cerebral artery as mentioned above.

PLAN:
Findings reviewed with patient and the patient's family.

Patient advised to continue strict management of his hypertension,
and continue with dual anti-platelet therapy. Follow-up CT angiogram
of the head and neck in 6 months time. Should the patient develop
new neurological symptoms he and his spouse were asked to call 911.
They expressed understanding and agreement with the above management
plan.

## 2019-10-16 ENCOUNTER — Ambulatory Visit: Payer: BC Managed Care – PPO | Admitting: Internal Medicine

## 2019-10-17 ENCOUNTER — Encounter: Payer: Self-pay | Admitting: Internal Medicine

## 2019-10-28 ENCOUNTER — Telehealth: Payer: Self-pay | Admitting: Internal Medicine

## 2019-10-28 ENCOUNTER — Other Ambulatory Visit: Payer: Self-pay

## 2019-10-28 DIAGNOSIS — Z1159 Encounter for screening for other viral diseases: Secondary | ICD-10-CM

## 2019-10-28 DIAGNOSIS — Z20822 Contact with and (suspected) exposure to covid-19: Secondary | ICD-10-CM

## 2019-10-28 NOTE — Telephone Encounter (Signed)
Cosigned orders  Please inform of protocol of quarantine   Thank you  TMS  

## 2019-10-28 NOTE — Telephone Encounter (Signed)
Patient experiencing COVID like symptoms runny nose, sneezing and sore throat. Patient has ask for COVID test . Test entered. FYI

## 2019-10-28 NOTE — Telephone Encounter (Signed)
Protocol was advised see note to wife.

## 2019-10-30 LAB — NOVEL CORONAVIRUS, NAA: SARS-CoV-2, NAA: NOT DETECTED

## 2019-11-05 ENCOUNTER — Other Ambulatory Visit: Payer: Self-pay | Admitting: Internal Medicine

## 2019-11-05 DIAGNOSIS — I639 Cerebral infarction, unspecified: Secondary | ICD-10-CM

## 2019-11-05 DIAGNOSIS — E785 Hyperlipidemia, unspecified: Secondary | ICD-10-CM

## 2019-11-05 DIAGNOSIS — I10 Essential (primary) hypertension: Secondary | ICD-10-CM

## 2019-11-05 MED ORDER — CLOPIDOGREL BISULFATE 75 MG PO TABS: 75.0000 mg | ORAL_TABLET | Freq: Every day | ORAL | 0 refills | Status: DC

## 2019-11-05 MED ORDER — ATORVASTATIN CALCIUM 40 MG PO TABS: 40.0000 mg | ORAL_TABLET | Freq: Every day | ORAL | 3 refills | Status: DC

## 2019-11-05 MED ORDER — METOPROLOL TARTRATE 25 MG PO TABS: 25.0000 mg | ORAL_TABLET | Freq: Two times a day (BID) | ORAL | 1 refills | Status: AC

## 2019-11-11 DIAGNOSIS — G3184 Mild cognitive impairment, so stated: Secondary | ICD-10-CM | POA: Diagnosis not present

## 2020-02-18 ENCOUNTER — Other Ambulatory Visit: Payer: Self-pay | Admitting: Internal Medicine

## 2020-02-18 DIAGNOSIS — I639 Cerebral infarction, unspecified: Secondary | ICD-10-CM

## 2020-02-18 MED ORDER — CLOPIDOGREL BISULFATE 75 MG PO TABS: 75.0000 mg | ORAL_TABLET | Freq: Every day | ORAL | 0 refills | Status: AC

## 2020-03-04 ENCOUNTER — Ambulatory Visit: Payer: BC Managed Care – PPO | Admitting: Internal Medicine

## 2020-03-05 ENCOUNTER — Other Ambulatory Visit: Payer: Self-pay | Admitting: Internal Medicine

## 2020-03-05 DIAGNOSIS — R7989 Other specified abnormal findings of blood chemistry: Secondary | ICD-10-CM

## 2020-03-05 DIAGNOSIS — N1832 Chronic kidney disease, stage 3b: Secondary | ICD-10-CM

## 2020-03-06 ENCOUNTER — Other Ambulatory Visit: Payer: Self-pay | Admitting: Internal Medicine

## 2020-03-06 DIAGNOSIS — I10 Essential (primary) hypertension: Secondary | ICD-10-CM

## 2020-03-06 MED ORDER — OLMESARTAN MEDOXOMIL-HCTZ 40-25 MG PO TABS: 1.0000 | ORAL_TABLET | Freq: Every day | ORAL | 1 refills | Status: DC

## 2020-03-12 ENCOUNTER — Other Ambulatory Visit: Payer: Self-pay

## 2020-03-12 ENCOUNTER — Ambulatory Visit
Admission: RE | Admit: 2020-03-12 | Discharge: 2020-03-12 | Disposition: A | Discharge status: Home / Self Care | Payer: Managed Care, Other (non HMO) | Source: Ambulatory Visit | Attending: Internal Medicine | Admitting: Internal Medicine

## 2020-03-12 DIAGNOSIS — N1832 Chronic kidney disease, stage 3b: Secondary | ICD-10-CM | POA: Insufficient documentation

## 2020-03-12 DIAGNOSIS — R7989 Other specified abnormal findings of blood chemistry: Secondary | ICD-10-CM | POA: Diagnosis present

## 2020-04-02 ENCOUNTER — Encounter: Payer: Managed Care, Other (non HMO) | Admitting: Dermatology

## 2020-04-14 ENCOUNTER — Telehealth (HOSPITAL_COMMUNITY): Payer: Self-pay

## 2020-04-14 NOTE — Telephone Encounter (Signed)
Called to schedule cta head/neck, no answer, no vm. AW 

## 2020-04-27 ENCOUNTER — Emergency Department
Admission: EM | Admit: 2020-04-27 | Discharge: 2020-04-29 | Disposition: A | Discharge status: Other Acute Inpatient Hospital | Payer: Managed Care, Other (non HMO) | Attending: Emergency Medicine | Admitting: Emergency Medicine

## 2020-04-27 ENCOUNTER — Encounter: Payer: Self-pay | Admitting: Emergency Medicine

## 2020-04-27 ENCOUNTER — Other Ambulatory Visit: Payer: Self-pay

## 2020-04-27 DIAGNOSIS — Z7982 Long term (current) use of aspirin: Secondary | ICD-10-CM | POA: Insufficient documentation

## 2020-04-27 DIAGNOSIS — Z79899 Other long term (current) drug therapy: Secondary | ICD-10-CM | POA: Insufficient documentation

## 2020-04-27 DIAGNOSIS — G4700 Insomnia, unspecified: Secondary | ICD-10-CM | POA: Diagnosis present

## 2020-04-27 DIAGNOSIS — F0390 Unspecified dementia without behavioral disturbance: Secondary | ICD-10-CM

## 2020-04-27 DIAGNOSIS — Z8673 Personal history of transient ischemic attack (TIA), and cerebral infarction without residual deficits: Secondary | ICD-10-CM | POA: Insufficient documentation

## 2020-04-27 DIAGNOSIS — Z20822 Contact with and (suspected) exposure to covid-19: Secondary | ICD-10-CM | POA: Insufficient documentation

## 2020-04-27 DIAGNOSIS — I639 Cerebral infarction, unspecified: Secondary | ICD-10-CM | POA: Diagnosis present

## 2020-04-27 DIAGNOSIS — I6521 Occlusion and stenosis of right carotid artery: Secondary | ICD-10-CM | POA: Diagnosis present

## 2020-04-27 DIAGNOSIS — K824 Cholesterolosis of gallbladder: Secondary | ICD-10-CM | POA: Diagnosis present

## 2020-04-27 DIAGNOSIS — I10 Essential (primary) hypertension: Secondary | ICD-10-CM | POA: Diagnosis present

## 2020-04-27 DIAGNOSIS — I672 Cerebral atherosclerosis: Secondary | ICD-10-CM | POA: Diagnosis present

## 2020-04-27 DIAGNOSIS — Z7902 Long term (current) use of antithrombotics/antiplatelets: Secondary | ICD-10-CM | POA: Diagnosis not present

## 2020-04-27 DIAGNOSIS — Z0000 Encounter for general adult medical examination without abnormal findings: Secondary | ICD-10-CM

## 2020-04-27 DIAGNOSIS — N529 Male erectile dysfunction, unspecified: Secondary | ICD-10-CM | POA: Diagnosis present

## 2020-04-27 DIAGNOSIS — Z046 Encounter for general psychiatric examination, requested by authority: Secondary | ICD-10-CM | POA: Diagnosis present

## 2020-04-27 DIAGNOSIS — R7303 Prediabetes: Secondary | ICD-10-CM | POA: Diagnosis present

## 2020-04-27 DIAGNOSIS — E785 Hyperlipidemia, unspecified: Secondary | ICD-10-CM | POA: Diagnosis present

## 2020-04-27 DIAGNOSIS — E559 Vitamin D deficiency, unspecified: Secondary | ICD-10-CM | POA: Diagnosis present

## 2020-04-27 DIAGNOSIS — K760 Fatty (change of) liver, not elsewhere classified: Secondary | ICD-10-CM | POA: Diagnosis present

## 2020-04-27 DIAGNOSIS — G4733 Obstructive sleep apnea (adult) (pediatric): Secondary | ICD-10-CM

## 2020-04-27 LAB — CBC
HCT: 42.9 % (ref 39.0–52.0)
Hemoglobin: 15.6 g/dL (ref 13.0–17.0)
MCH: 33.8 pg (ref 26.0–34.0)
MCHC: 36.4 g/dL — ABNORMAL HIGH (ref 30.0–36.0)
MCV: 93.1 fL (ref 80.0–100.0)
Platelets: 297 10*3/uL (ref 150–400)
RBC: 4.61 MIL/uL (ref 4.22–5.81)
RDW: 12.8 % (ref 11.5–15.5)
WBC: 13 10*3/uL — ABNORMAL HIGH (ref 4.0–10.5)
nRBC: 0 % (ref 0.0–0.2)

## 2020-04-27 LAB — COMPREHENSIVE METABOLIC PANEL
ALT: 58 U/L — ABNORMAL HIGH (ref 0–44)
AST: 50 U/L — ABNORMAL HIGH (ref 15–41)
Albumin: 4.9 g/dL (ref 3.5–5.0)
Alkaline Phosphatase: 79 U/L (ref 38–126)
Anion gap: 12 (ref 5–15)
BUN: 18 mg/dL (ref 8–23)
CO2: 25 mmol/L (ref 22–32)
Calcium: 10 mg/dL (ref 8.9–10.3)
Chloride: 95 mmol/L — ABNORMAL LOW (ref 98–111)
Creatinine, Ser: 1.66 mg/dL — ABNORMAL HIGH (ref 0.61–1.24)
GFR calc Af Amer: 50 mL/min — ABNORMAL LOW (ref >60)
GFR calc non Af Amer: 43 mL/min — ABNORMAL LOW (ref >60)
Glucose, Bld: 208 mg/dL — ABNORMAL HIGH (ref 70–99)
Potassium: 3.6 mmol/L (ref 3.5–5.1)
Sodium: 132 mmol/L — ABNORMAL LOW (ref 135–145)
Total Bilirubin: 1.2 mg/dL (ref 0.3–1.2)
Total Protein: 8.1 g/dL (ref 6.5–8.1)

## 2020-04-27 LAB — URINE DRUG SCREEN, QUALITATIVE (ARMC ONLY)
Amphetamines, Ur Screen: NOT DETECTED
Barbiturates, Ur Screen: NOT DETECTED
Benzodiazepine, Ur Scrn: NOT DETECTED
Cannabinoid 50 Ng, Ur ~~LOC~~: NOT DETECTED
Cocaine Metabolite,Ur ~~LOC~~: NOT DETECTED
MDMA (Ecstasy)Ur Screen: NOT DETECTED
Methadone Scn, Ur: NOT DETECTED
Opiate, Ur Screen: NOT DETECTED
Phencyclidine (PCP) Ur S: NOT DETECTED
Tricyclic, Ur Screen: NOT DETECTED

## 2020-04-27 LAB — ACETAMINOPHEN LEVEL: Acetaminophen (Tylenol), Serum: <10 ug/mL — ABNORMAL LOW (ref 10–30)

## 2020-04-27 LAB — ETHANOL: Alcohol, Ethyl (B): <10 mg/dL (ref <10)

## 2020-04-27 LAB — SALICYLATE LEVEL: Salicylate Lvl: <7 mg/dL — ABNORMAL LOW (ref 7.0–30.0)
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
## 2020-04-27 NOTE — ED Notes (Signed)
Pt given home saline eye drops from personal belongings and contact case w solution. Okay per MD Fuller Plan.
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
## 2020-04-27 NOTE — ED Notes (Signed)
Pt given snack and drink 

## 2020-04-27 NOTE — ED Notes (Signed)
ED Provider MD Fuller Plan at bedside.

## 2020-04-27 NOTE — ED Notes (Signed)
Patient discusses about marital issues recently and how he has been feeling frustrated with his wife.  Patient states he left home without telling anyone and he [place], [place]

## 2020-04-28 LAB — SARS CORONAVIRUS 2 BY RT PCR (HOSPITAL ORDER, PERFORMED IN ~~LOC~~ HOSPITAL LAB): SARS Coronavirus 2: NEGATIVE

## 2020-04-28 MED ORDER — HYDROCHLOROTHIAZIDE 25 MG PO TABS
25.0000 mg | ORAL_TABLET | Freq: Every day | ORAL | Status: DC
  Administered 2020-04-28 – 2020-04-29 (×2): 25 mg via ORAL
  Filled 2020-04-28 (×2): qty 1

## 2020-04-28 MED ORDER — AMLODIPINE BESYLATE 5 MG PO TABS
5.0000 mg | ORAL_TABLET | Freq: Every day | ORAL | Status: DC
  Administered 2020-04-28 – 2020-04-29 (×2): 5 mg via ORAL
  Filled 2020-04-28 (×2): qty 1

## 2020-04-28 MED ORDER — ATORVASTATIN CALCIUM 20 MG PO TABS
40.0000 mg | ORAL_TABLET | Freq: Every day | ORAL | Status: DC
  Administered 2020-04-28: 40 mg via ORAL
  Filled 2020-04-28: qty 2

## 2020-04-28 MED ORDER — OMEGA-3-ACID ETHYL ESTERS 1 G PO CAPS: 1.0000 g | ORAL_CAPSULE | Freq: Every day | ORAL | Status: DC

## 2020-04-28 MED ORDER — HYDROCHLOROTHIAZIDE 25 MG PO TABS: 25.0000 mg | ORAL_TABLET | Freq: Every morning | ORAL | Status: DC

## 2020-04-28 MED ORDER — CLOPIDOGREL BISULFATE 75 MG PO TABS
75.0000 mg | ORAL_TABLET | Freq: Every day | ORAL | Status: DC
  Administered 2020-04-28: 75 mg via ORAL
  Filled 2020-04-28: qty 1

## 2020-04-28 MED ORDER — ASPIRIN EC 81 MG PO TBEC
81.0000 mg | DELAYED_RELEASE_TABLET | Freq: Every day | ORAL | Status: DC
  Administered 2020-04-28 – 2020-04-29 (×2): 81 mg via ORAL
  Filled 2020-04-28 (×2): qty 1

## 2020-04-28 MED ORDER — ADULT MULTIVITAMIN W/MINERALS CH
1.0000 | ORAL_TABLET | Freq: Every day | ORAL | Status: DC
  Administered 2020-04-28 – 2020-04-29 (×2): 1 via ORAL
  Filled 2020-04-28 (×2): qty 1

## 2020-04-28 MED ORDER — METOPROLOL TARTRATE 25 MG PO TABS
25.0000 mg | ORAL_TABLET | Freq: Two times a day (BID) | ORAL | Status: DC
  Administered 2020-04-28 – 2020-04-29 (×3): 25 mg via ORAL
  Filled 2020-04-28 (×3): qty 1

## 2020-04-28 MED ORDER — OMEGA-3-ACID ETHYL ESTERS 1 G PO CAPS
1.0000 g | ORAL_CAPSULE | Freq: Every day | ORAL | Status: DC
  Administered 2020-04-28 – 2020-04-29 (×2): 1 g via ORAL
  Filled 2020-04-28 (×2): qty 1

## 2020-04-28 MED ORDER — IRBESARTAN 150 MG PO TABS
300.0000 mg | ORAL_TABLET | Freq: Every day | ORAL | Status: DC
  Filled 2020-04-28 (×2): qty 2

## 2020-04-28 MED ORDER — IRBESARTAN 150 MG PO TABS: 300.0000 mg | ORAL_TABLET | Freq: Every day | ORAL | Status: DC

## 2020-04-28 MED ORDER — HALOPERIDOL 0.5 MG PO TABS
0.2500 mg | ORAL_TABLET | Freq: Two times a day (BID) | ORAL | Status: DC
  Administered 2020-04-28 – 2020-04-29 (×2): 0.25 mg via ORAL
  Filled 2020-04-28 (×2): qty 1

## 2020-04-28 MED ORDER — DONEPEZIL HCL 5 MG PO TABS
10.0000 mg | ORAL_TABLET | Freq: Every day | ORAL | Status: DC
  Administered 2020-04-28: 10 mg via ORAL
  Filled 2020-04-28: qty 2

## 2020-04-28 NOTE — ED Notes (Signed)
Patient asked this writer to get contact case and solution out of belongings so he could remove his contact lens. This Clinical research associate removed solution and case and labeled with patient info sticker. Patient placed right contact in case and filled with solution as this Clinical research associate witnessed.  Contact case and solution stored at nurses station.

## 2020-04-28 NOTE — ED Provider Notes (Signed)
Dr. Sherryll Burger with Neurology called to discuss case. Patient unfortunately has progressively worsening, now fairly severe FTD with high risk behavior. He is concerned about patient's capacity and decision-making. Psychiatry has evaluated and agrees patient needs inpatient placement.   Shaune Pollack, MD 04/28/20 [phone]

## 2020-04-28 NOTE — ED Notes (Addendum)
POA Wife [name] [phone]

## 2020-04-28 NOTE — BH Assessment (Signed)
Referral information for Psychiatric Hospitalization faxed to;   . [facility] ([phone]-or- [phone]),   .  [facility] (-[phone] -or- [phone]) [phone]  . [facility] ([phone]---[phone]---[phone]),  . [facility] ([phone]),   . [facility] ([phone] or [phone])  . [facility] ([phone] or [phone]),   . [facility] ([phone]). 

## 2020-04-28 NOTE — BH Assessment (Signed)
Writer spoke with patient's wife ([phone]), updated her patient's disposition. Spoke with patient wife for approximately 30 minutes. Wife voiced her frustration with the process of not knowing the details. Writer was able to answer her questions in regards to the placement process and what it would look like if he improves an no longer need inpatient treatment, while in the ER. Writer provided her with the list of hospitals they are considering for gero psych inpatient.  Wife request a meeting to speak with psych team. Writer informed her, he would not be able to promise that will happened but will forwarded the information and someone would contact them.  Wife main concerns are based on the previous experience they have had with [facility] when the patient had a stroke. Since then, their have been a lack a trust with the medical staff.

## 2020-04-28 NOTE — ED Notes (Signed)
Asked pharmacy to verify pt's meds so he may receive home meds at scheduled times.

## 2020-04-28 NOTE — ED Notes (Signed)
IVC/  PENDING  PLACEMENT 

## 2020-04-28 NOTE — BH Assessment (Addendum)
Assessment Note  Jesse Black is an 64 y.o. male presenting to [facility] ED under IVC. Per triage note Patient presents to the ED via [facility] PD with IVC paperwork.  Per IVC papers, patient has dementia post stroke and has been driving when he should not be per his neurologist.  IVC paperwork state, "Patient deemed a danger to himself and others."  Patient is very talkative during triage. During assessment patient was alert and oriented x4, calm and cooperative. When asked why patient was presenting to the ED he reports "I don't know, I left the gym and I saw the blue lights pull me over, I guess someone called the police on me." Patient reports he is unaware of why he was pulled over by the police. Patient denies any mental health history. Per patient's son patient's son reports patient has dementia had was not allowed to drive, patient's son denies any mental health history.    Diagnosis: Memory Loss, Dementia  Past Medical History:  Past Medical History:  Diagnosis Date  . High blood pressure   . Hyperlipidemia   . Memory loss   . Prediabetes   . Stroke (cerebrum) ([place])   . Stroke [facility])     Past Surgical History:  Procedure Laterality Date  . COLONOSCOPY WITH PROPOFOL N/A 10/05/2015   Procedure: COLONOSCOPY WITH PROPOFOL;  Surgeon: Manya Silvas, MD;  Location: [facility] ENDOSCOPY;  Service: Endoscopy;  Laterality: N/A;  . IR ANGIO INTRA EXTRACRAN SEL COM CAROTID INNOMINATE BILAT MOD SED  02/05/2019  . IR ANGIO VERTEBRAL SEL SUBCLAVIAN INNOMINATE UNI R MOD SED  02/05/2019  . IR ANGIO VERTEBRAL SEL VERTEBRAL UNI L MOD SED  02/05/2019  . IR US GUIDE VASC ACCESS RIGHT  02/05/2019  . LOOP RECORDER INSERTION N/A 05/22/2019   Procedure: LOOP RECORDER INSERTION;  Surgeon: Isaias Cowman, MD;  Location: [place] [facility];  Service: Cardiovascular;  Laterality: N/A;    Family History:  Family History  Problem Relation Age of Onset  . Lung cancer Father   . Dementia Mother      Social History:  reports that he has never smoked. He has never used smokeless tobacco. He reports current alcohol use. He reports that he does not use drugs.  Additional Social History:  Alcohol / Drug Use Pain Medications: See MAR Prescriptions: See MAR Over the Counter: See MAR History of alcohol / drug use?: No history of alcohol / drug abuse  CIWA: CIWA-Ar BP: (!) 165/95 Pulse Rate: 97 COWS:    Allergies: No Known Allergies  Home Medications: (Not in a hospital admission)   OB/GYN Status:  No LMP for male patient.  General Assessment Data Location of Assessment: [facility] ED TTS Assessment: In system Is this a Tele or Face-to-Face Assessment?: Face-to-Face Is this an Initial Assessment or a Re-assessment for this encounter?: Initial Assessment Patient Accompanied by:: N/A Language Other than English: No Living Arrangements: Other (Comment)(Private Residence) What gender do you identify as?: Male Marital status: Married Living Arrangements: Spouse/significant other Can pt return to current living arrangement?: Yes Admission Status: Involuntary Petitioner: Police Is patient capable of signing voluntary admission?: No Referral Source: Other Insurance type: Ship broker Exam ([place] Medical Exam completed: Yes  Crisis Care Plan Living Arrangements: Spouse/significant other Legal Guardian: Other:(Self) Name of Psychiatrist: None Name of Therapist: None  Education Status Is patient currently in school?: No Is the patient employed, unemployed or receiving disability?: Unemployed  Risk to self with the past 6 months  Suicidal Ideation: No Has patient been a risk to self within the past 6 months prior to admission? : No Suicidal Intent: No Has patient had any suicidal intent within the past 6 months prior to admission? : No Is patient at risk for suicide?: No Suicidal Plan?: No Has patient had any suicidal plan within the past 6 months  prior to admission? : No Access to Means: No What has been your use of drugs/alcohol within the last 12 months?: None Previous Attempts/Gestures: No How many times?: 0 Triggers for Past Attempts: None known Intentional Self Injurious Behavior: None Family Suicide History: No Recent stressful life event(s): Other (Comment)(None reported) Persecutory voices/beliefs?: No Depression: No Substance abuse history and/or treatment for substance abuse?: No Suicide prevention information given to non-admitted patients: Not applicable  Risk to Others within the past 6 months Homicidal Ideation: No Does patient have any lifetime risk of violence toward others beyond the six months prior to admission? : No Thoughts of Harm to Others: No Current Homicidal Intent: No Current Homicidal Plan: No Access to Homicidal Means: No Identified Victim: None History of harm to others?: No Assessment of Violence: None Noted Violent Behavior Description: None Does patient have access to weapons?: No Criminal Charges Pending?: No Does patient have a court date: No Is patient on probation?: No  Psychosis Hallucinations: None noted Delusions: None noted  Mental Status Report Appearance/Hygiene: In scrubs Eye Contact: Good Motor Activity: Freedom of movement Speech: Logical/coherent Level of Consciousness: Alert Mood: Pleasant Affect: Appropriate to circumstance Anxiety Level: None Thought Processes: Coherent Judgement: Unimpaired Orientation: Person, Place, Time, Situation, Appropriate for developmental age Obsessive Compulsive Thoughts/Behaviors: None  Cognitive Functioning Concentration: Normal Memory: Recent Intact, Remote Intact Is patient IDD: No Insight: Good Impulse Control: Good Appetite: Good Have you had any weight changes? : No Change Sleep: No Change Total Hours of Sleep: 8 Vegetative Symptoms: None  ADLScreening [facility] Assessment Services) Patient's cognitive ability adequate  to safely complete daily activities?: Yes Patient able to express need for assistance with ADLs?: Yes Independently performs ADLs?: Yes (appropriate for developmental age)  Prior Inpatient Therapy Prior Inpatient Therapy: No  Prior Outpatient Therapy Prior Outpatient Therapy: No Does patient have an ACCT team?: No Does patient have Intensive In-House Services?  : No Does patient have [facility] services? : No Does patient have P4CC services?: No  ADL Screening (condition at time of admission) Patient's cognitive ability adequate to safely complete daily activities?: Yes Is the patient deaf or have difficulty hearing?: No Does the patient have difficulty seeing, even when wearing glasses/contacts?: No Does the patient have difficulty concentrating, remembering, or making decisions?: No Patient able to express need for assistance with ADLs?: Yes Does the patient have difficulty dressing or bathing?: No Independently performs ADLs?: Yes (appropriate for developmental age) Does the patient have difficulty walking or climbing stairs?: No Weakness of Legs: None Weakness of Arms/Hands: None  Home Assistive Devices/Equipment Home Assistive Devices/Equipment: None  Therapy Consults (therapy consults require a physician order) PT Evaluation Needed: No OT Evalulation Needed: No SLP Evaluation Needed: No Abuse/Neglect Assessment (Assessment to be complete while patient is alone) Abuse/Neglect Assessment Can Be Completed: Yes Physical Abuse: Denies Verbal Abuse: Denies Sexual Abuse: Denies Exploitation of patient/patient's resources: Denies Self-Neglect: Denies Values / Beliefs Cultural Requests During Hospitalization: None Spiritual Requests During Hospitalization: None Consults Spiritual Care Consult Needed: No Transition of Care Team Consult Needed: No Advance Directives (For Healthcare) Does Patient Have a Medical Advance Directive?: No Would patient like information on  creating a  medical advance directive?: No - Patient declined          Disposition:  Disposition Initial Assessment Completed for this Encounter: Yes  On Site Evaluation by:   Reviewed with Physician:    Benay Pike MS LCASA 04/28/2020 2:21 AM

## 2020-04-28 NOTE — Consult Note (Addendum)
[facility] Face-to-Face Psychiatry Consult   Reason for Consult: Psychiatric Evaluation Referring Physician: Dr. Fuller Plan Patient Identification: Stoy Fenn MRN:  [id number] Principal Diagnosis: <principal problem not specified> Diagnosis:  Active Problems:   Annual physical exam   Hypertension   CVA (cerebral vascular accident) (HCC)   Prediabetes   Hyperlipidemia   Intracranial atherosclerosis   Stenosis of right carotid artery   Vitamin D deficiency   Insomnia   Fatty liver   Gallbladder polyp   OSA on CPAP   Erectile dysfunction  Total Time spent with patient: 1 hour  Subjective: "I was leaving the gym and the police pulled me over and brought me into the hospital." Crespin Forstrom is a 64 y.o. male patient presented to [facility] ED via law enforcement under involuntary commitment status (IVC). Per the triage nurse note and the IVC papers, the patient has dementia post-stroke and has been driving when he should not be per his neurologist. The IVC paperwork state, "the patient deemed a danger to himself and others." The patient is very talkative during triage.   The patient was seen face-to-face by this provider; chart reviewed and consulted with Dr. Fuller Plan on 04/27/2020 due to the patient's care. It was discussed with both providers that the patient does not meet the criteria to be admitted to the psychiatric inpatient unit.  On evaluation, the patient is alert and oriented x4, calm and cooperative, and mood-congruent with affect.  The patient does not appear to be responding to internal or external stimuli. Neither is the patient presenting with any delusional thinking. The patient denies auditory or visual hallucinations. The patient denies any suicidal, homicidal, or self-harm ideations. The patient is not presenting with any psychotic or paranoid behaviors. During an encounter with the patient, he was able to answer questions appropriately. Obtained collateral from the patient's  family wife, Ms. Joncarlos Atkison- [phone], Son Kona Lover [phone], and AMY Clint Guy [phone], who demanded that the patient see a psychologist tonight to have the person do his psychiatric evaluation.  The patient's neurologist is Dr. Sherryll Burger.  The patient wife stated, Dr. Clelia Croft recommended a psychologist see the patient.  When it was discussed with her we did not have a psychologist in the ED, she became agitated, voicing, "you guys are making me mad; I do not want to talk to you."  She asked for this writer's credential and expressed that her husband still needs to be seen by a psychologist or a psychiatrist.  The patient daughter Amy asked if Dr. Daryel November was working tonight because he is a friend of hers, and she wanted him to attend to her father.  The family suggested that the EDP see the patient and have her do more testing to ensure he did not have another stroke. Related the information to Dr. Fuller Plan.  The patient daughter Amy voiced her father is presenting with the same symptoms that he presented with when he had his first stroke. Plan: The patient is not a safety risk to self or others and does not require psychiatric inpatient admission for stabilization and treatment.  HPI: Per Dr. Fuller Plan: Yony Roulston is a 64 y.o. male with prior stroke, frontotemporal dementia who comes in under IVC from police.  Patient has dementia post stroke has been driving when he should not be per his neurologist.  "Patient deemed a danger to himself and others".  When I discussed with patient he states that he is here because the police brought him in because  he was told that he was not supposed to drive.  Patient states that it was just a suggestion and he felt that he could still drive.  He denies any SI, HI, auditory visual hallucinations. States he will have an occasional beer but denies drinking heavily.  Past Psychiatric History:  Stroke (cerebrum) ([place]) Stroke ([place])  Risk to Self:  Suicidal Ideation: No Suicidal Intent: No Is patient at risk for suicide?: No Suicidal Plan?: No Access to Means: No What has been your use of drugs/alcohol within the last 12 months?: None How many times?: 0 Triggers for Past Attempts: None known Intentional Self Injurious Behavior: None Risk to Others: Homicidal Ideation: No Thoughts of Harm to Others: No Current Homicidal Intent: No Current Homicidal Plan: No Access to Homicidal Means: No Identified Victim: None History of harm to others?: No Assessment of Violence: None Noted Violent Behavior Description: None Does patient have access to weapons?: No Criminal Charges Pending?: No Does patient have a court date: No Prior Inpatient Therapy: Prior Inpatient Therapy: No Prior Outpatient Therapy: Prior Outpatient Therapy: No Does patient have an ACCT team?: No Does patient have Intensive In-House Services?  : No Does patient have [facility] services? : No Does patient have P4CC services?: No  Past Medical History:  Past Medical History:  Diagnosis Date  . High blood pressure   . Hyperlipidemia   . Memory loss   . Prediabetes   . Stroke (cerebrum) ([place])   . Stroke [facility])     Past Surgical History:  Procedure Laterality Date  . COLONOSCOPY WITH PROPOFOL N/A 10/05/2015   Procedure: COLONOSCOPY WITH PROPOFOL;  Surgeon: Manya Silvas, MD;  Location: [facility] ENDOSCOPY;  Service: Endoscopy;  Laterality: N/A;  . IR ANGIO INTRA EXTRACRAN SEL COM CAROTID INNOMINATE BILAT MOD SED  02/05/2019  . IR ANGIO VERTEBRAL SEL SUBCLAVIAN INNOMINATE UNI R MOD SED  02/05/2019  . IR ANGIO VERTEBRAL SEL VERTEBRAL UNI L MOD SED  02/05/2019  . IR US GUIDE VASC ACCESS RIGHT  02/05/2019  . LOOP RECORDER INSERTION N/A 05/22/2019   Procedure: LOOP RECORDER INSERTION;  Surgeon: Isaias Cowman, MD;  Location: [place] CV LAB;  Service: Cardiovascular;  Laterality: N/A;   Family History:  Family History  Problem Relation Age of Onset  . Lung  cancer Father   . Dementia Mother    Family Psychiatric  History:  Social History:  Social History   Substance and Sexual Activity  Alcohol Use Yes   Comment: ?amt      Social History   Substance and Sexual Activity  Drug Use No    Social History   Socioeconomic History  . Marital status: Married    Spouse name: Not on file  . Number of children: Not on file  . Years of education: Not on file  . Highest education level: Not on file  Occupational History    Employer: [employer]  Tobacco Use  . Smoking status: Never Smoker  . Smokeless tobacco: Never Used  Substance and Sexual Activity  . Alcohol use: Yes    Comment: ?amt   . Drug use: No  . Sexual activity: Yes  Other Topics Concern  . Not on file  Social History Narrative   Married    Kids    Social Determinants of Health   Financial Resource Strain: Low Risk   . Difficulty of Paying Living Expenses: Not hard at all  Food Insecurity: No Food Insecurity  . Worried About Charity fundraiser in  the Last Year: Never true  . Ran Out of Food in the Last Year: Never true  Transportation Needs: No Transportation Needs  . Lack of Transportation (Medical): No  . Lack of Transportation (Non-Medical): No  Physical Activity:   . Days of Exercise per Week:   . Minutes of Exercise per Session:   Stress:   . Feeling of Stress :   Social Connections:   . Frequency of Communication with Friends and Family:   . Frequency of Social Gatherings with Friends and Family:   . Attends Religious Services:   . Active Member of Clubs or Organizations:   . Attends Banker Meetings:   Marland Kitchen Marital Status:    Additional Social History:    Allergies:  No Known Allergies  Labs:  Results for orders placed or performed during the hospital encounter of 04/27/20 (from the past 48 hour(s))  Comprehensive metabolic panel     Status: Abnormal   Collection Time: 04/27/20  6:47 PM  Result Value Ref Range   Sodium 132 (L) 135 -  145 mmol/L   Potassium 3.6 3.5 - 5.1 mmol/L   Chloride 95 (L) 98 - 111 mmol/L   CO2 25 22 - 32 mmol/L   Glucose, Bld 208 (H) 70 - 99 mg/dL    Comment: Glucose reference range applies only to samples taken after fasting for at least 8 hours.   BUN 18 8 - 23 mg/dL   Creatinine, Ser 6.56 (H) 0.61 - 1.24 mg/dL   Calcium 81.2 8.9 - 75.1 mg/dL   Total Protein 8.1 6.5 - 8.1 g/dL   Albumin 4.9 3.5 - 5.0 g/dL   AST 50 (H) 15 - 41 U/L   ALT 58 (H) 0 - 44 U/L   Alkaline Phosphatase 79 38 - 126 U/L   Total Bilirubin 1.2 0.3 - 1.2 mg/dL   GFR calc non Af Amer 43 (L) >60 mL/min   GFR calc Af Amer 50 (L) >60 mL/min   Anion gap 12 5 - 15    Comment: Performed at [facility], [address] [place], [place] [address]  Ethanol     Status: None   Collection Time: 04/27/20  6:47 PM  Result Value Ref Range   Alcohol, Ethyl (B) <10 <10 mg/dL    Comment: (NOTE) Lowest detectable limit for serum alcohol is 10 mg/dL. For medical purposes only. Performed at [facility], [address] [place], [place] [address]   Salicylate level     Status: Abnormal   Collection Time: 04/27/20  6:47 PM  Result Value Ref Range   Salicylate Lvl <7.0 (L) 7.0 - 30.0 mg/dL    Comment: Performed at [facility], [address] [place], [place] [address]  Acetaminophen level     Status: Abnormal   Collection Time: 04/27/20  6:47 PM  Result Value Ref Range   Acetaminophen (Tylenol), Serum <10 (L) 10 - 30 ug/mL    Comment: (NOTE) Therapeutic concentrations vary significantly. A range of 10-30 ug/mL  may be an effective concentration for many patients. However, some  are best treated at concentrations outside of this range. Acetaminophen concentrations >150 ug/mL at 4 hours after ingestion  and >50 ug/mL at 12 hours after ingestion are often associated with  toxic reactions. Performed at [facility], [address] [place], [place] [address]   cbc     Status: Abnormal    Collection Time: 04/27/20  6:47 PM  Result Value Ref Range  WBC 13.0 (H) 4.0 - 10.5 K/uL   RBC 4.61 4.22 - 5.81 MIL/uL   Hemoglobin 15.6 13.0 - 17.0 g/dL   HCT 40.942.9 81.139.0 - 91.452.0 %   MCV 93.1 80.0 - 100.0 fL   MCH 33.8 26.0 - 34.0 pg   MCHC 36.4 (H) 30.0 - 36.0 g/dL   RDW 78.212.8 95.611.5 - 21.315.5 %   Platelets 297 150 - 400 K/uL   nRBC 0.0 0.0 - 0.2 %    Comment: Performed at [facility], [address] [place], [place] [address]  Urine Drug Screen, Qualitative     Status: None   Collection Time: 04/27/20  6:48 PM  Result Value Ref Range   Tricyclic, Ur Screen NONE DETECTED NONE DETECTED   Amphetamines, Ur Screen NONE DETECTED NONE DETECTED   MDMA (Ecstasy)Ur Screen NONE DETECTED NONE DETECTED   Cocaine Metabolite,Ur [place] NONE DETECTED NONE DETECTED   Opiate, Ur Screen NONE DETECTED NONE DETECTED   Phencyclidine (PCP) Ur S NONE DETECTED NONE DETECTED   Cannabinoid 50 Ng, Ur [place] NONE DETECTED NONE DETECTED   Barbiturates, Ur Screen NONE DETECTED NONE DETECTED   Benzodiazepine, Ur Scrn NONE DETECTED NONE DETECTED   Methadone Scn, Ur NONE DETECTED NONE DETECTED    Comment: (NOTE) Tricyclics + metabolites, urine    Cutoff 1000 ng/mL Amphetamines + metabolites, urine  Cutoff 1000 ng/mL MDMA (Ecstasy), urine              Cutoff 500 ng/mL Cocaine Metabolite, urine          Cutoff 300 ng/mL Opiate + metabolites, urine        Cutoff 300 ng/mL Phencyclidine (PCP), urine         Cutoff 25 ng/mL Cannabinoid, urine                 Cutoff 50 ng/mL Barbiturates + metabolites, urine  Cutoff 200 ng/mL Benzodiazepine, urine              Cutoff 200 ng/mL Methadone, urine                   Cutoff 300 ng/mL The urine drug screen provides only a preliminary, unconfirmed analytical test result and should not be used for non-medical purposes. Clinical consideration and professional judgment should be applied to any positive drug screen result due to possible interfering substances. A more specific  alternate chemical method must be used in order to obtain a confirmed analytical result. Gas chromatography / mass spectrometry (GC/MS) is the preferred confirmat ory method. Performed at [facility], [address] [place], [place] [address]   SARS Coronavirus 2 by RT PCR (hospital order, performed in [facility] hospital lab) Nasopharyngeal Nasopharyngeal Swab     Status: None   Collection Time: 04/28/20  1:07 AM   Specimen: Nasopharyngeal Swab  Result Value Ref Range   SARS Coronavirus 2 NEGATIVE NEGATIVE    Comment: (NOTE) SARS-CoV-2 target nucleic acids are NOT DETECTED. The SARS-CoV-2 RNA is generally detectable in upper and lower respiratory specimens during the acute phase of infection. The lowest concentration of SARS-CoV-2 viral copies this assay can detect is 250 copies / mL. A negative result does not preclude SARS-CoV-2 infection and should not be used as the sole basis for treatment or other patient management decisions.  A negative result may occur with improper specimen collection / handling, submission of specimen other than nasopharyngeal swab, presence of viral mutation(s) within the areas targeted by this assay, and  inadequate number of viral copies (<250 copies / mL). A negative result must be combined with clinical observations, patient history, and epidemiological information. Fact Sheet for Patients:   BoilerBrush.com.cy Fact Sheet for Healthcare Providers: https://pope.com/ This test is not yet approved or cleared  by the [place] FDA and has been authorized for detection and/or diagnosis of SARS-CoV-2 by FDA under an Emergency Use Authorization (EUA).  This EUA will remain in effect (meaning this test can be used) for the duration of the COVID-19 declaration under Section 564(b)(1) of the Act, 21 U.S.C. section 360bbb-3(b)(1), unless the authorization is terminated or revoked  sooner. Performed at [facility], [address] [place], [place] [address]     No current facility-administered medications for this encounter.   Current Outpatient Medications  Medication Sig Dispense Refill  . acetaminophen (TYLENOL) 500 MG tablet Take 500 mg by mouth daily as needed for moderate pain or headache.    Marland Kitchen amLODipine (NORVASC) 5 MG tablet Take 1 tablet (5 mg total) by mouth daily. 90 tablet 3  . aspirin EC 81 MG tablet Take 81 mg by mouth daily.    Marland Kitchen atorvastatin (LIPITOR) 40 MG tablet Take 1 tablet (40 mg total) by mouth daily at 6 PM. 90 tablet 3  . calcium carbonate (TUMS - DOSED IN MG ELEMENTAL CALCIUM) 500 MG chewable tablet Chew 1 tablet by mouth daily as needed for indigestion or heartburn.    . clopidogrel (PLAVIX) 75 MG tablet Take 1 tablet (75 mg total) by mouth daily. Further refills Dr. Sherryll Burger 90 tablet 0  . donepezil (ARICEPT) 10 MG tablet Take 10 mg by mouth at bedtime.    . Melatonin 5 MG CAPS Take 5 mg by mouth at bedtime as needed (sleep).    . metoprolol tartrate (LOPRESSOR) 25 MG tablet Take 1 tablet (25 mg total) by mouth 2 (two) times daily. 180 tablet 1  . Multiple Vitamin (MULTI-VITAMIN DAILY) TABS Take 1 tablet by mouth daily.    Marland Kitchen neomycin-polymyxin-hydrocortisone (CORTISPORIN) OTIC solution Place 4 drops into the left ear 4 (four) times daily. x7-10 days (Patient taking differently: Place 4 drops into the left ear 4 (four) times daily as needed (itchiness in the ear). x7-10 days) 10 mL 0  . olmesartan (BENICAR) 40 MG tablet Take 40 mg by mouth every morning.    Marland Kitchen omega-3 acid ethyl esters (LOVAZA) 1 g capsule Take 1 capsule (1 g total) by mouth daily. 90 capsule 3  . Tetrahydrozoline HCl (VISINE OP) Place 1 drop into both eyes daily as needed (dry eyes).    . hydrochlorothiazide (HYDRODIURIL) 25 MG tablet Take 25 mg by mouth every morning.    . olmesartan-hydrochlorothiazide (BENICAR HCT) 40-25 MG tablet Take 1 tablet by mouth daily. In am,  if needs to take 2 separate pills of same dose ok. After this refill pt found another PCP no longer will get refills Dr. [employer] (Patient not taking: Reported on 04/28/2020) 90 tablet 1    Musculoskeletal: Strength & Muscle Tone: within normal limits Gait & Station: normal Patient leans: N/A  Psychiatric Specialty Exam: Physical Exam  Nursing note and vitals reviewed. Constitutional: He is oriented to person, place, and time. He appears well-developed and well-nourished.  Cardiovascular: Normal rate.  Respiratory: Effort normal.  Musculoskeletal:        General: Normal range of motion.     Cervical back: Normal range of motion and neck supple.  Neurological: He is alert and oriented to person, place, and  time.  Psychiatric: He has a normal mood and affect. Thought content normal.    Review of Systems  Psychiatric/Behavioral: The patient is nervous/anxious.   All other systems reviewed and are negative.   Blood pressure (!) 165/95, pulse 97, temperature 98.7 F (37.1 C), temperature source Oral, resp. rate 18, height  (1.702 m), weight 95.3 kg, SpO2 99 %.Body mass index is 32.89 kg/m.  General Appearance: Casual  Eye Contact:  Good  Speech:  Clear and Coherent  Volume:  Normal  Mood:  Anxious  Affect:  Appropriate  Thought Process:  Coherent  Orientation:  Full (Time, Place, and Person)  Thought Content:  WDL and Logical  Suicidal Thoughts:  No  Homicidal Thoughts:  No  Memory:  Immediate;   Good Recent;   Good Remote;   Good  Judgement:  Fair  Insight:  Lacking  Psychomotor Activity:  Normal  Concentration:  Concentration: Good and Attention Span: Good  Recall:  Good  Fund of Knowledge:  Good  Language:  Good  Akathisia:  Negative  Handed:  Right  AIMS (if indicated):     Assets:  Communication Skills Desire for Improvement Physical Health Social Support  ADL's:  Intact  Cognition:  WNL  Sleep:    Insomnia     Treatment Plan Summary: Plan Patient does not  meet criteria for psychiatric inpatient admission.  Disposition: Patient does not meet criteria for psychiatric inpatient admission. Supportive therapy provided about ongoing stressors. Refer to IOP. Discussed crisis plan, support from social network, calling 911, coming to the Emergency Department, and calling Suicide Hotline.  Gillermo Murdoch, NP 04/28/2020 2:59 AM

## 2020-04-28 NOTE — ED Provider Notes (Signed)
Emergency Medicine Observation Re-evaluation Note  Arden Tinoco is a 64 y.o. male, seen on rounds today.  Pt initially presented to the ED for complaints of Psychiatric Evaluation Currently, the patient is resting in no acute distress.  Physical Exam  BP (!) 165/95 (BP Location: Left Arm)   Pulse 97   Temp 98.7 F (37.1 C) (Oral)   Resp 18   Ht 5\' 7"  (1.702 m)   Wt 95.3 kg   SpO2 99%   BMI 32.89 kg/m  Physical Exam  ED Course / MDM  EKG:    I have reviewed the labs performed to date as well as medications administered while in observation.  Recent changes in the last 24 hours include no events overnight. Home medications ordered. Plan  Current plan is for psychiatric disposition. Patient is under full IVC at this time.   , MD 04/28/20 [phone]

## 2020-04-28 NOTE — ED Notes (Signed)
IVC/Consult Completed/Pending Placement 

## 2020-04-28 NOTE — ED Notes (Signed)
Pt. To BHU from ED ambulatory without difficulty, to room BHU 4. Report from [facility]. Pt. Is alert and oriented, warm and dry in no distress. Pt. Denies SI, HI, and AVH. Pt. Calm and cooperative. Pt. Made aware of security cameras and Q15 minute rounds. Pt. Encouraged to let Nursing staff know of any concerns or needs.

## 2020-04-28 NOTE — ED Notes (Signed)

## 2020-04-28 NOTE — Progress Notes (Signed)
[facility]  04/28/2020 12:41 PM Maximiano CossMark Austin Kapfer  MRN:  [id number] Subjective:    I love the gym  Principal Problem:  History of Dementia, followed by neurology increasing cognitive changes  Diagnosis: Active Problems:   Annual physical exam   Hypertension   CVA (cerebral vascular accident) (HCC)   Prediabetes   Hyperlipidemia   Intracranial atherosclerosis   Stenosis of right carotid artery   Vitamin D deficiency   Insomnia   Fatty liver   Gallbladder polyp   OSA on CPAP   Erectile dysfunction  Total Time spent with patient:  30 minutes  Past Psychiatric History:  Followed by Neurology, seen by Psychiatry today in ER and yesterday   Past Medical History:  Past Medical History:  Diagnosis Date  . High blood pressure   . Hyperlipidemia   . Memory loss   . Prediabetes   . Stroke (cerebrum) (HCC)   . Stroke [facility](HCC)     Past Surgical History:  Procedure Laterality Date  . COLONOSCOPY WITH PROPOFOL N/A 10/05/2015   Procedure: COLONOSCOPY WITH PROPOFOL;  Surgeon: Scot Junobert T Elliott, MD;  [facility];  Service: Endoscopy;  Laterality: N/A;  . IR ANGIO INTRA EXTRACRAN SEL COM CAROTID INNOMINATE BILAT MOD SED  02/05/2019  . IR ANGIO VERTEBRAL SEL SUBCLAVIAN INNOMINATE UNI R MOD SED  02/05/2019  . IR ANGIO VERTEBRAL SEL VERTEBRAL UNI L MOD SED  02/05/2019  . IR US GUIDE VASC ACCESS RIGHT  02/05/2019  . LOOP RECORDER INSERTION N/A 05/22/2019   Procedure: LOOP RECORDER INSERTION;  Surgeon: Marcina MillardParaschos, Alexander, MD;  Location: [facility] INVASIVE CV LAB;  Service: Cardiovascular;  Laterality: N/A;   Family History:  Family History  Problem Relation Age of Onset  . Lung cancer Father   . Dementia Mother    Family Psychiatric  History:      None reported affecting patient    Social History:  Lives with family, on IVC they are concerned about his driving without license or approval from Neurology  Social History   Substance and Sexual Activity  Alcohol Use Yes    Comment: ?amt      Social History   Substance and Sexual Activity  Drug Use No    Social History   Socioeconomic History  . Marital status: Married    Spouse name: Not on file  . Number of children: Not on file  . Years of education: Not on file  . Highest education level: Not on file  Occupational History    Employer: [employer]  Tobacco Use  . Smoking status: Never Smoker  . Smokeless tobacco: Never Used  Substance and Sexual Activity  . Alcohol use: Yes    Comment: ?amt   . Drug use: No  . Sexual activity: Yes  Other Topics Concern  . Not on file  Social History Narrative   Married    Kids    Social Determinants of Health   Financial Resource Strain: Low Risk   . Difficulty of Paying Living Expenses: Not hard at all  Food Insecurity: No Food Insecurity  . Worried About Programme researcher, broadcasting/film/videounning Out of Food in the Last Year: Never true  . Ran Out of Food in the Last Year: Never true  Transportation Needs: No Transportation Needs  . Lack of Transportation (Medical): No  . Lack of Transportation (Non-Medical): No  Physical Activity:   . Days of Exercise per Week:   . Minutes of Exercise per Session:   Stress:   .  Feeling of Stress :   Social Connections:   . Frequency of Communication with Friends and Family:   . Frequency of Social Gatherings with Friends and Family:   . Attends Religious Services:   . Active Member of Clubs or Organizations:   . Attends Archivist Meetings:   Marland Kitchen Marital Status:    Additional Social History: Currently not working, harder to manage ADL's and general routine    Pain Medications: See MAR Prescriptions: See MAR Over the Counter: See MAR History of alcohol / drug use?: No history of alcohol / drug abuse                    Sleep:  At times has trouble with all three sleep cycles   Appetite:   Normal   Current Medications: Current Facility-Administered Medications  Medication Dose Route Frequency Provider Last Rate Last  Admin  . amLODipine (NORVASC) tablet 5 mg  5 mg Oral Daily Paulette Blanch, MD   5 mg at 04/28/20 0958  . aspirin EC tablet 81 mg  81 mg Oral Daily Paulette Blanch, MD   81 mg at 04/28/20 1610  . atorvastatin (LIPITOR) tablet 40 mg  40 mg Oral q1800 Paulette Blanch, MD      . clopidogrel (PLAVIX) tablet 75 mg  75 mg Oral Daily Paulette Blanch, MD   75 mg at 04/28/20 0958  . donepezil (ARICEPT) tablet 10 mg  10 mg Oral QHS Paulette Blanch, MD      . hydrochlorothiazide (HYDRODIURIL) tablet 25 mg  25 mg Oral Daily Paulette Blanch, MD   25 mg at 04/28/20 0958  . irbesartan (AVAPRO) tablet 300 mg  300 mg Oral Daily Paulette Blanch, MD      . metoprolol tartrate (LOPRESSOR) tablet 25 mg  25 mg Oral BID Paulette Blanch, MD   25 mg at 04/28/20 0958  . multivitamin with minerals tablet 1 tablet  1 tablet Oral Daily Paulette Blanch, MD   1 tablet at 04/28/20 [phone]  . omega-3 acid ethyl esters (LOVAZA) capsule 1 g  1 g Oral Daily Paulette Blanch, MD   1 g at 04/28/20 5409   Current Outpatient Medications  Medication Sig Dispense Refill  . acetaminophen (TYLENOL) 500 MG tablet Take 500 mg by mouth daily as needed for moderate pain or headache.    Marland Kitchen amLODipine (NORVASC) 5 MG tablet Take 1 tablet (5 mg total) by mouth daily. 90 tablet 3  . aspirin EC 81 MG tablet Take 81 mg by mouth daily.    Marland Kitchen atorvastatin (LIPITOR) 40 MG tablet Take 1 tablet (40 mg total) by mouth daily at 6 PM. 90 tablet 3  . calcium carbonate (TUMS - DOSED IN MG ELEMENTAL CALCIUM) 500 MG chewable tablet Chew 1 tablet by mouth daily as needed for indigestion or heartburn.    . clopidogrel (PLAVIX) 75 MG tablet Take 1 tablet (75 mg total) by mouth daily. Further refills Dr. Manuella Ghazi 90 tablet 0  . donepezil (ARICEPT) 10 MG tablet Take 10 mg by mouth at bedtime.    . Melatonin 5 MG CAPS Take 5 mg by mouth at bedtime as needed (sleep).    . metoprolol tartrate (LOPRESSOR) 25 MG tablet Take 1 tablet (25 mg total) by mouth 2 (two) times daily. 180 tablet 1  . Multiple Vitamin  (MULTI-VITAMIN DAILY) TABS Take 1 tablet by mouth daily.    Marland Kitchen neomycin-polymyxin-hydrocortisone (CORTISPORIN) OTIC solution  Place 4 drops into the left ear 4 (four) times daily. x7-10 days (Patient taking differently: Place 4 drops into the left ear 4 (four) times daily as needed (itchiness in the ear). x7-10 days) 10 mL 0  . olmesartan (BENICAR) 40 MG tablet Take 40 mg by mouth every morning.    Marland Kitchen omega-3 acid ethyl esters (LOVAZA) 1 g capsule Take 1 capsule (1 g total) by mouth daily. 90 capsule 3  . Tetrahydrozoline HCl (VISINE OP) Place 1 drop into both eyes daily as needed (dry eyes).    . hydrochlorothiazide (HYDRODIURIL) 25 MG tablet Take 25 mg by mouth every morning.    . olmesartan-hydrochlorothiazide (BENICAR HCT) 40-25 MG tablet Take 1 tablet by mouth daily. In am, if needs to take 2 separate pills of same dose ok. After this refill pt found another PCP no longer will get refills Dr. Lonie Peak (Patient not taking: Reported on 04/28/2020) 90 tablet 1    Lab Results:  Results for orders placed or performed during the hospital encounter of 04/27/20 (from the past 48 hour(s))  Comprehensive metabolic panel     Status: Abnormal   Collection Time: 04/27/20  6:47 PM  Result Value Ref Range   Sodium 132 (L) 135 - 145 mmol/L   Potassium 3.6 3.5 - 5.1 mmol/L   Chloride 95 (L) 98 - 111 mmol/L   CO2 25 22 - 32 mmol/L   Glucose, Bld 208 (H) 70 - 99 mg/dL    Comment: Glucose reference range applies only to samples taken after fasting for at least 8 hours.   BUN 18 8 - 23 mg/dL   Creatinine, Ser 0.93 (H) 0.61 - 1.24 mg/dL   Calcium 23.5 8.9 - 57.3 mg/dL   Total Protein 8.1 6.5 - 8.1 g/dL   Albumin 4.9 3.5 - 5.0 g/dL   AST 50 (H) 15 - 41 U/L   ALT 58 (H) 0 - 44 U/L   Alkaline Phosphatase 79 38 - 126 U/L   Total Bilirubin 1.2 0.3 - 1.2 mg/dL   GFR calc non Af Amer 43 (L) >60 mL/min   GFR calc Af Amer 50 (L) >60 mL/min   Anion gap 12 5 - 15    Comment: Performed at [facility], [address] [place], [place] [address]  Ethanol     Status: None   Collection Time: 04/27/20  6:47 PM  Result Value Ref Range   Alcohol, Ethyl (B) <10 <10 mg/dL    Comment: (NOTE) Lowest detectable limit for serum alcohol is 10 mg/dL. For medical purposes only. Performed at [facility], [address] [place], [place] [address]   Salicylate level     Status: Abnormal   Collection Time: 04/27/20  6:47 PM  Result Value Ref Range   Salicylate Lvl <7.0 (L) 7.0 - 30.0 mg/dL    Comment: Performed at [facility], [address] [place], [place] [address]  Acetaminophen level     Status: Abnormal   Collection Time: 04/27/20  6:47 PM  Result Value Ref Range   Acetaminophen (Tylenol), Serum <10 (L) 10 - 30 ug/mL    Comment: (NOTE) Therapeutic concentrations vary significantly. A range of 10-30 ug/mL  may be an effective concentration for many patients. However, some  are best treated at concentrations outside of this range. Acetaminophen concentrations >150 ug/mL at 4 hours after ingestion  and >50 ug/mL at 12 hours after ingestion are often associated with  toxic reactions. Performed at [facility], [address] [place], [place] [address]   cbc     Status: Abnormal   Collection Time: 04/27/20  6:47 PM  Result Value Ref Range   WBC 13.0 (H) 4.0 - 10.5 K/uL   RBC 4.61 4.22 - 5.81 MIL/uL   Hemoglobin 15.6 13.0 - 17.0 g/dL   HCT 63.7 85.8 - 85.0 %   MCV 93.1 80.0 - 100.0 fL   MCH 33.8 26.0 - 34.0 pg   MCHC 36.4 (H) 30.0 - 36.0 g/dL   RDW 27.7 41.2 - 87.8 %   Platelets 297 150 - 400 K/uL   nRBC 0.0 0.0 - 0.2 %    Comment: Performed at [facility], [address] [place], [place] [address]  Urine Drug Screen, Qualitative     Status: None   Collection Time: 04/27/20  6:48 PM  Result Value Ref Range   Tricyclic, Ur Screen NONE DETECTED NONE DETECTED   Amphetamines, Ur Screen NONE DETECTED NONE DETECTED   MDMA (Ecstasy)Ur Screen NONE  DETECTED NONE DETECTED   Cocaine Metabolite,Ur Eddy NONE DETECTED NONE DETECTED   Opiate, Ur Screen NONE DETECTED NONE DETECTED   Phencyclidine (PCP) Ur S NONE DETECTED NONE DETECTED   Cannabinoid 50 Ng, Ur Lott NONE DETECTED NONE DETECTED   Barbiturates, Ur Screen NONE DETECTED NONE DETECTED   Benzodiazepine, Ur Scrn NONE DETECTED NONE DETECTED   Methadone Scn, Ur NONE DETECTED NONE DETECTED    Comment: (NOTE) Tricyclics + metabolites, urine    Cutoff 1000 ng/mL Amphetamines + metabolites, urine  Cutoff 1000 ng/mL MDMA (Ecstasy), urine              Cutoff 500 ng/mL Cocaine Metabolite, urine          Cutoff 300 ng/mL Opiate + metabolites, urine        Cutoff 300 ng/mL Phencyclidine (PCP), urine         Cutoff 25 ng/mL Cannabinoid, urine                 Cutoff 50 ng/mL Barbiturates + metabolites, urine  Cutoff 200 ng/mL Benzodiazepine, urine              Cutoff 200 ng/mL Methadone, urine                   Cutoff 300 ng/mL The urine drug screen provides only a preliminary, unconfirmed analytical test result and should not be used for non-medical purposes. Clinical consideration and professional judgment should be applied to any positive drug screen result due to possible interfering substances. A more specific alternate chemical method must be used in order to obtain a confirmed analytical result. Gas chromatography / mass spectrometry (GC/MS) is the preferred confirmat ory method. Performed at [facility], [address] [place], [place] [address]   SARS Coronavirus 2 by RT PCR (hospital order, performed in [facility] hospital lab) Nasopharyngeal Nasopharyngeal Swab     Status: None   Collection Time: 04/28/20  1:07 AM   Specimen: Nasopharyngeal Swab  Result Value Ref Range   SARS Coronavirus 2 NEGATIVE NEGATIVE    Comment: (NOTE) SARS-CoV-2 target nucleic acids are NOT DETECTED. The SARS-CoV-2 RNA is generally detectable in upper and lower respiratory specimens during  the acute phase of infection. The lowest concentration of SARS-CoV-2 viral copies this assay can detect is 250 copies / mL. A negative result does not preclude SARS-CoV-2 infection and should not be used as the sole basis for treatment or other patient  management decisions.  A negative result may occur with improper specimen collection / handling, submission of specimen other than nasopharyngeal swab, presence of viral mutation(s) within the areas targeted by this assay, and inadequate number of viral copies (<250 copies / mL). A negative result must be combined with clinical observations, patient history, and epidemiological information. Fact Sheet for Patients:   BoilerBrush.com.cy Fact Sheet for Healthcare Providers: https://pope.com/ This test is not yet approved or cleared  by the [place] FDA and has been authorized for detection and/or diagnosis of SARS-CoV-2 by FDA under an Emergency Use Authorization (EUA).  This EUA will remain in effect (meaning this test can be used) for the duration of the COVID-19 declaration under Section 564(b)(1) of the Act, 21 U.S.C. section 360bbb-3(b)(1), unless the authorization is terminated or revoked sooner. Performed at [facility], [address] [place], [place] [address]     Blood Alcohol level:  Lab Results  Component Value Date   [facility] <10 04/27/2020   ETH <10 01/24/2019    Metabolic Disorder Labs: Lab Results  Component Value Date   HGBA1C 6.4 08/14/2019   MPG 119.76 01/25/2019   No results found for: PROLACTIN Lab Results  Component Value Date   CHOL 131 08/14/2019   TRIG 178.0 (H) 08/14/2019   HDL 36.20 (L) 08/14/2019   CHOLHDL 4 08/14/2019   VLDL 35.6 08/14/2019   LDLCALC 59 08/14/2019   LDLCALC 60 05/10/2019     Musculoskeletal: Strength & Muscle Tone: within normal limits Gait & Station: broad based Patient leans: N/A  Psychiatric Specialty  Exam: Physical Exam  Review of Systems  Blood pressure (!) 149/91, pulse 89, temperature 98.4 F (36.9 C), temperature source Oral, resp. rate 19, height 5\' 7"  (1.702 m), weight 95.3 kg, SpO2 98 %.Body mass index is 32.89 kg/m.  General Appearance:  Obese at rest   Eye Contact:  Fair to poor   Speech:  Slightly rapid pressured   Volume:  Slightly circumstantial  Mood:  Slightly euphoric strange and odd  Affect:  Slightly elevated   Thought Process:  Illogical, vague answers approximate answers   Orientation:  Part date and time, place   Thought Content:  Over inclusive themes  Suicidal Thoughts:  none  Homicidal Thoughts:  none  Memory:  Errors with long term and short term, not cooperative however   Judgement:  poor  Insight:  Poor   Psychomotor Activity:  Slightly elevated   Concentration:  Fair to poor   Recall:  Fair to poor   of Knowledge:  Reducing with time   Language:  normal  Akithisia  No   Handed:    AIMS (if indicated):     Assets:  Family supportive   ADL's:  Gradual reduction  Cognition:  Gradually reducing on all levels   Sleep:   at times all three cycles affected      Treatment Plan Summary:   Patient to be admitted, remains on IVC will do second eval to confirm when expiring and also will transfer to [employer]  Will call family to start low dose haldol   0.25 mg po bid    [employer], MD 04/28/2020, 12:41 PM

## 2020-04-29 NOTE — ED Notes (Signed)
IVC/  PENDING  PLACEMENT 

## 2020-04-29 NOTE — ED Notes (Signed)
Pt verbalizes to me   "Jesse Black is not my POA - I had that revoked months ago.  She is not to have any information about me  I revoked her POA because she does not have my best interest in mind - my primary doctor Jesse Black) knows this"

## 2020-04-29 NOTE — ED Notes (Signed)
Update provided to his POA  Braelon Sprung  [phone]  She requests a call back from the psychiatrist today

## 2020-04-29 NOTE — BH Assessment (Signed)
Referral information for Psychiatric Hospitalization faxed to;    [facility] [phone]),    [facility] [phone]) [phone]   [facility] Pl[facility] [phone]), Denied reports patient does not meet criteria for Inpatient   [facility] [phone]),    [facility] [phone] or [phone])   [facility] [phone] or [phone]),    [facility] [phone]).

## 2020-04-29 NOTE — ED Notes (Signed)

## 2020-04-29 NOTE — ED Notes (Signed)
Pt given lunch tray and a cup of sprite.  

## 2020-04-29 NOTE — BH Assessment (Signed)
TTS contacted by wife regarding discussion held with [facility] yesterday. TTS provide wife with a status update and agreed to rely her request for a meeting to psych but did not provide any guarantees.

## 2020-04-29 NOTE — ED Notes (Signed)
Pt in shower at this time, breakfast tray placed on chair in rm.

## 2020-04-29 NOTE — BH Assessment (Addendum)
Referral Check:   [facility] [phone]), Amber reports under review    [facility] [phone]) [phone] Shanda Bumps asked to resend referral as of 5/19   [facility] ([phone]), Denied reports patient does not meet criteria for Inpatient   [facility] [phone]), Dorathy Daft denied due to dementia hx   [facility] [phone] or [phone]) Amira asked to resend referral as of 5/19   [facility] [phone] or [phone]), Corrie Dandy report plans to call back    [facility] [phone]) Left a voicemail

## 2020-04-29 NOTE — ED Notes (Signed)
Pt given meal tray and a cup of sprite.  

## 2020-04-29 NOTE — ED Notes (Signed)
Pt given supplies to shower 

## 2020-04-29 NOTE — ED Notes (Signed)
Pt. Alert and oriented, warm and dry, in no distress. Pt. Denies SI, HI, and AVH. Pt. Encouraged to let nursing staff know of any concerns or needs. 

## 2020-04-29 NOTE — ED Notes (Signed)
IVC/Pending [facility] psych Placement

## 2020-04-29 NOTE — BH Assessment (Addendum)
Patient has been accepted to [facility].  Patient assigned to: [address]  Accepting physician is Dr. Sara Chu.  Call report to [phone].  Representative was Amira.    ER Staff is aware of it:  [facility] ER Eppie Gibson, ER MD  Amy Patient's Nurse     Patient's Family/Support System (Wife [place]), [phone]) was contacted and updated.

## 2020-04-29 NOTE — ED Provider Notes (Signed)
Emergency Medicine Observation Re-evaluation Note  Jesse Black is a 64 y.o. male, seen on rounds today.  Pt initially presented to the ED for complaints of Psychiatric Evaluation Currently, the patient is resting in no acute distress.  Physical Exam  BP 136/78 (BP Location: Right Arm)   Pulse 84   Temp 98.4 F (36.9 C) (Oral)   Resp 17   Ht 5\' 7"  (1.702 m)   Wt 95.3 kg   SpO2 98%   BMI 32.89 kg/m  Physical Exam  ED Course / MDM  EKG:    I have reviewed the labs performed to date as well as medications administered while in observation.  Recent changes in the last 24 hours include no events overnight. Plan  Current plan is for placement. Patient is under full IVC at this time.   , MD 04/29/20 [phone]

## 2020-04-30 ENCOUNTER — Telehealth (HOSPITAL_COMMUNITY): Payer: Self-pay

## 2020-04-30 NOTE — Telephone Encounter (Signed)
Called to schedule f/u, no answer, no vm. AW 

## 2020-05-12 ENCOUNTER — Telehealth (HOSPITAL_COMMUNITY): Payer: Self-pay

## 2020-05-12 NOTE — Telephone Encounter (Signed)
Called to schedule cta head/neck, no answer, no vm. AW 

## 2020-05-15 ENCOUNTER — Telehealth (HOSPITAL_COMMUNITY): Payer: Self-pay

## 2020-05-15 NOTE — Telephone Encounter (Signed)
Called to schedule cta head/neck, no answer, no vm. AW 

## 2020-05-27 ENCOUNTER — Telehealth (HOSPITAL_COMMUNITY): Payer: Self-pay

## 2020-05-27 NOTE — Telephone Encounter (Signed)
3rd attempt to schedule cta head/neck, no answer, no vm. AW

## 2020-06-08 IMAGING — CT CT ANGIO NECK
2 of 10 series · 12 of 46 positions shown, 17 images · IV contrast (OMNI)
Comparison: MRI head 05/29/2019.  CTA head and neck 01/25/2019

CLINICAL DATA: History of stroke

EXAM:
CT ANGIOGRAPHY HEAD AND NECK
TECHNIQUE: Multidetector CT imaging of the head and neck was performed using
the standard protocol during bolus administration of intravenous
contrast. Multiplanar CT image reconstructions and MIPs were
obtained to evaluate the vascular anatomy. Carotid stenosis
measurements (when applicable) are obtained utilizing NASCET
criteria, using the distal internal carotid diameter as the
denominator.
CONTRAST:  75mL OMNIPAQUE IOHEXOL 350 MG/ML SOLN

[Series 8: thin · axial · 0.48mm/px · z∈[-299,+30]mm · 11 of 762 slices shown, 15 images]
[im 70/762  soft-tissue]
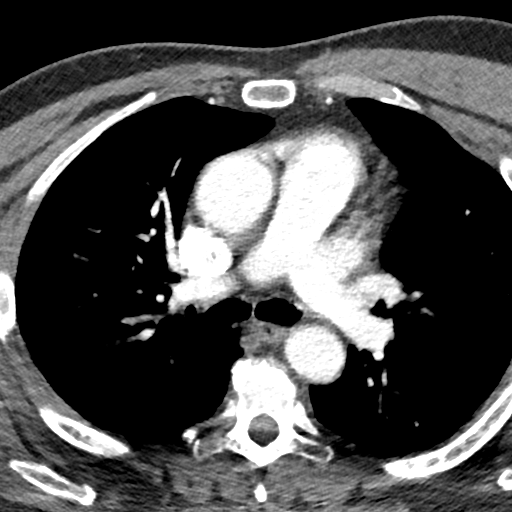
[im 70/762  bone]
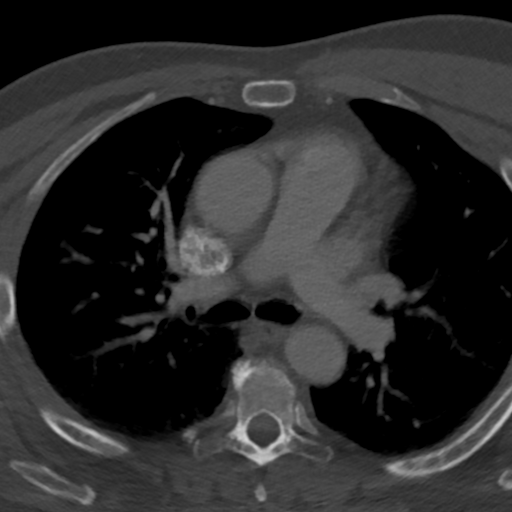
[im 139/762  soft-tissue]
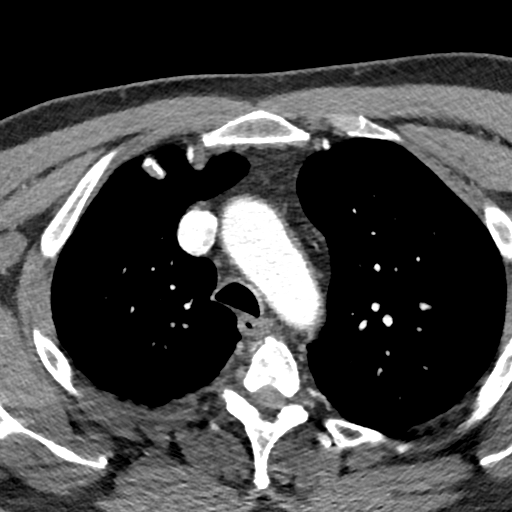
[im 208/762  soft-tissue]
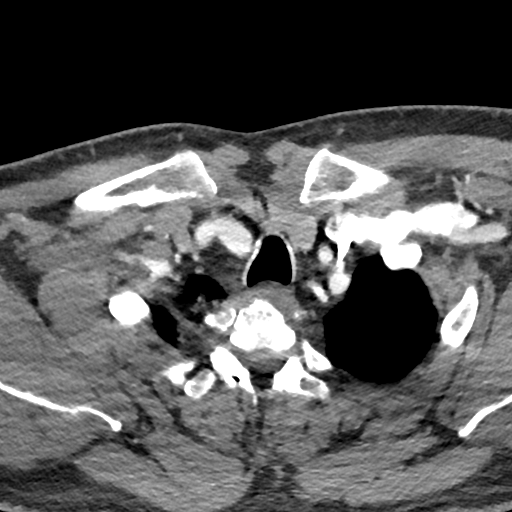
[im 312/762  soft-tissue]
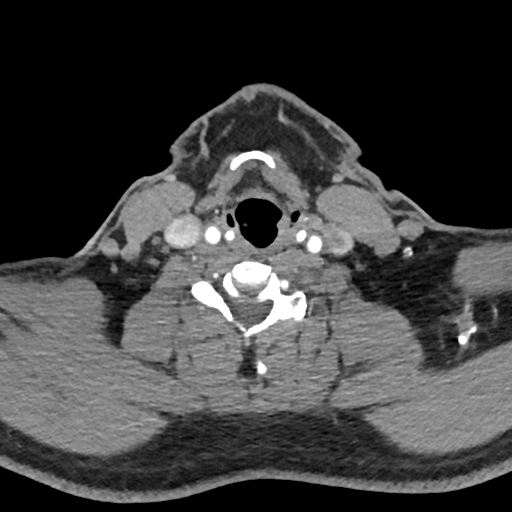
[im 381/762  soft-tissue]
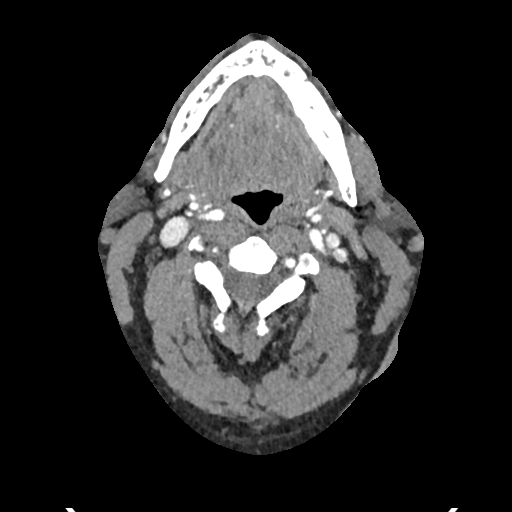
[im 450/762  soft-tissue]
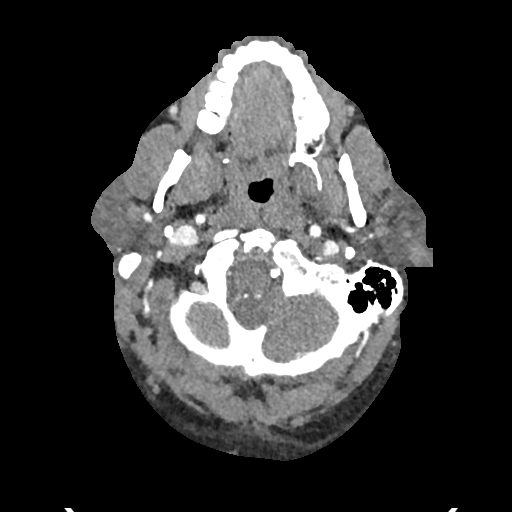
[im 554/762  soft-tissue]
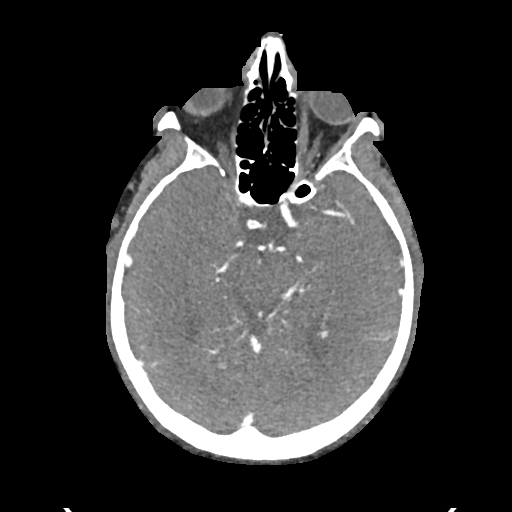
[im 623/762  soft-tissue]
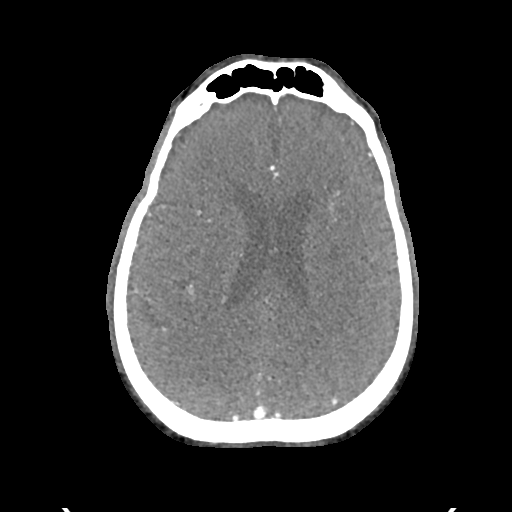
[im 623/762  lung]
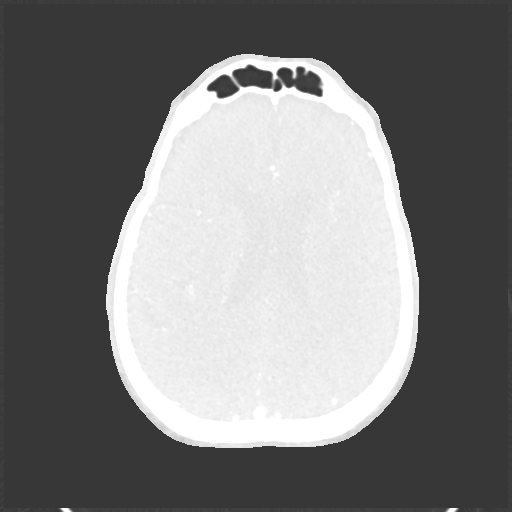
[im 658/762  lung]
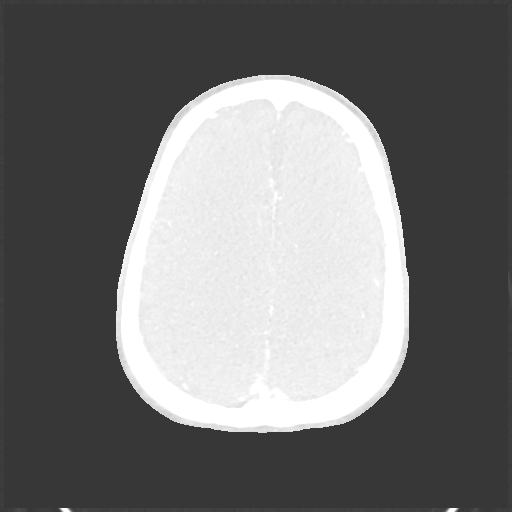
[im 692/762  soft-tissue]
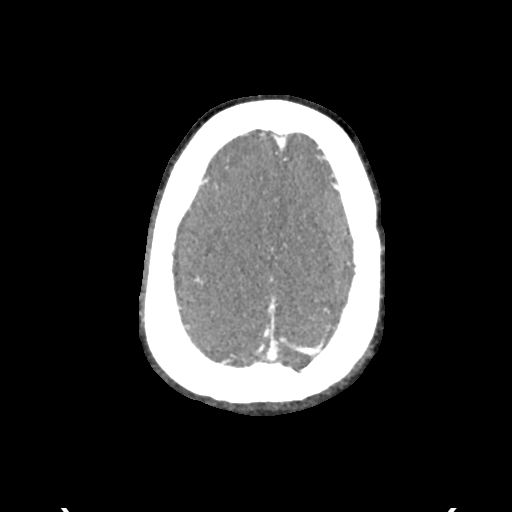
[im 692/762  lung]
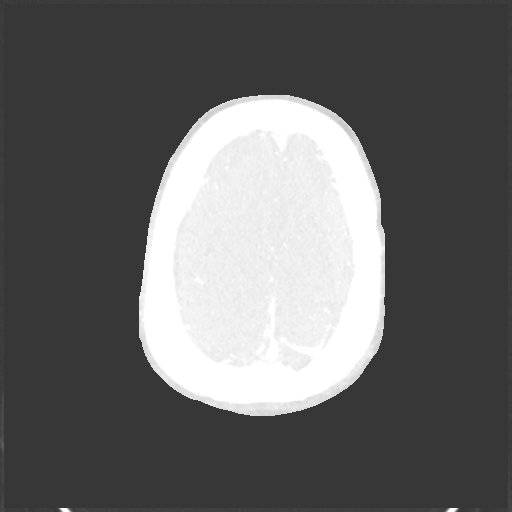
[im 692/762  bone]
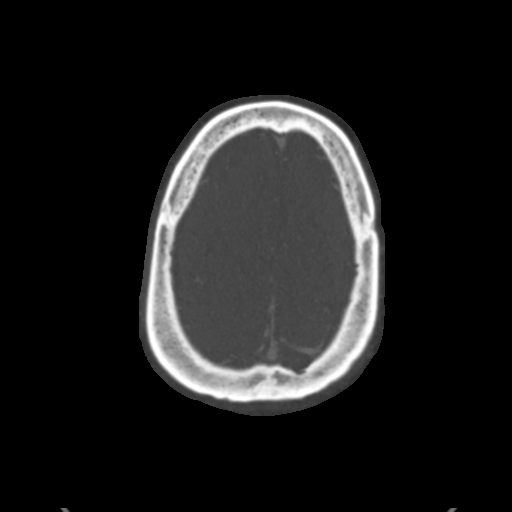
[im 727/762  lung]
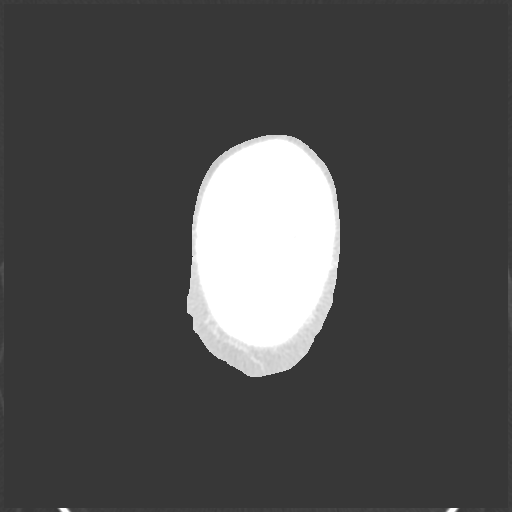

[Series 10: sagittal · sagittal · 0.34mm/px · 1 of 41 slices shown, 2 images]
[im 5/41  soft-tissue]
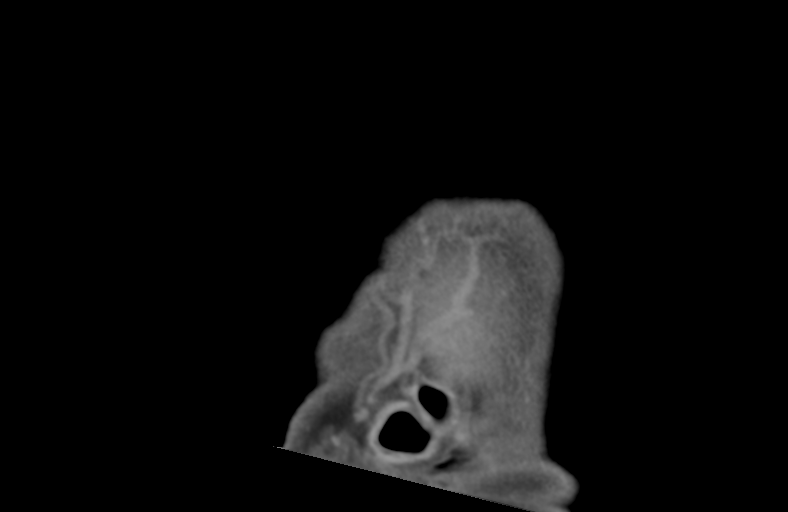
[im 5/41  bone]
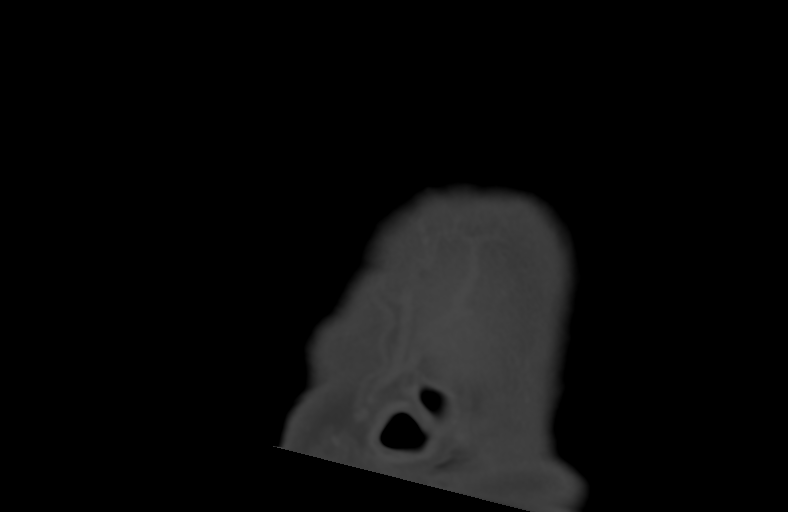

[12 of 46 positions shown; findings below may reference images not displayed]

FINDINGS: CT HEAD FINDINGS

Brain: Ventricle size normal. Scattered small white matter
hypodensities bilaterally are stable. No acute infarct. Negative for
acute hemorrhage or mass.

Vascular: Negative for hyperdense vessel

Skull: No acute abnormality

Sinuses: Negative

Orbits: Negative

Review of the MIP images confirms the above findings

CTA NECK FINDINGS

Aortic arch: Bovine branching arch. Proximal great vessels widely
patent. Aortic arch negative.

Right carotid system: Right carotid widely patent without
significant stenosis or dissection

Left carotid system: Left carotid widely patent without significant
stenosis or dissection.

Vertebral arteries: Left vertebral artery dominant and widely patent
to the basilar. Hypoplastic right vertebral artery patent to the
basilar

Skeleton: No acute skeletal abnormality.

Multiple areas of dental infection.

Other neck: Negative

Upper chest: Lung apices clear bilaterally.

Review of the MIP images confirms the above findings

CTA HEAD FINDINGS

Anterior circulation: Cavernous carotid patent bilaterally.
Scattered mild atherosclerotic disease in the middle cerebral artery
branches and M1 segments bilaterally. Severe stenosis left A2
segment. Moderate to severe stenosis right A2 segment.

Posterior circulation: Moderately severe calcific stenosis left V3
segment. Hypoplastic right vertebral artery patent to the basilar.
Basilar widely patent. PICA patent bilaterally. Moderate stenosis
distal right posterior cerebral artery unchanged. Severe stenosis
distal left posterior cerebral artery unchanged. Fetal origin left
posterior cerebral artery.

Venous sinuses: Normal venous enhancement.

Anatomic variants: None

Review of the MIP images confirms the above findings
IMPRESSION: 1. Chronic microvascular ischemic change in the white matter. No
acute intracranial abnormality.
2. No significant carotid or vertebral artery stenosis in the neck
3. Extensive intracranial atherosclerotic disease. Bilateral
atherosclerotic disease in the anterior, middle, and posterior
cerebral arteries appear stable from the prior CTA.

## 2020-06-08 IMAGING — CT CT ANGIO HEAD
2 of 9 series · 17 of 47 positions shown · IV contrast (omnipaque)
Comparison: MRI head 05/29/2019.  CTA head and neck 01/25/2019

CLINICAL DATA: History of stroke

EXAM:
CT ANGIOGRAPHY HEAD AND NECK
TECHNIQUE: Multidetector CT imaging of the head and neck was performed using
the standard protocol during bolus administration of intravenous
contrast. Multiplanar CT image reconstructions and MIPs were
obtained to evaluate the vascular anatomy. Carotid stenosis
measurements (when applicable) are obtained utilizing NASCET
criteria, using the distal internal carotid diameter as the
denominator.
CONTRAST:  75mL OMNIPAQUE IOHEXOL 350 MG/ML SOLN

[Series 8: thin · axial · 0.48mm/px · z∈[-316,+30]mm · 16 of 762 slices shown]
[im 35/762  brain]
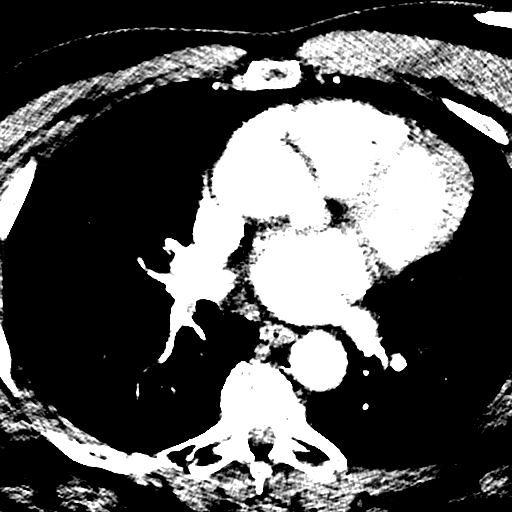
[im 70/762  bone]
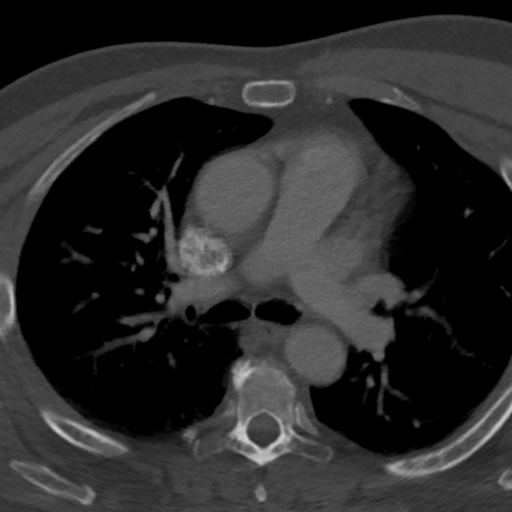
[im 139/762  brain]
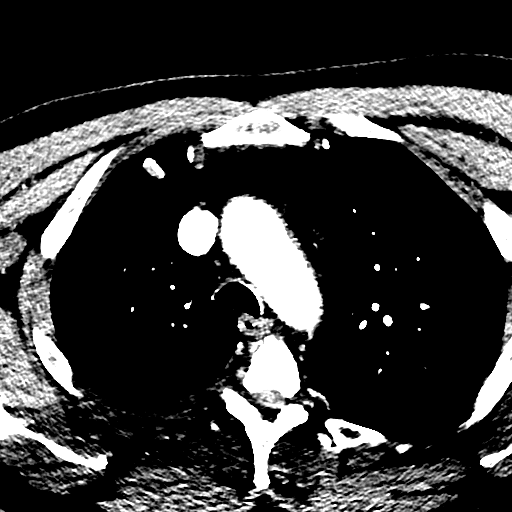
[im 173/762  bone]
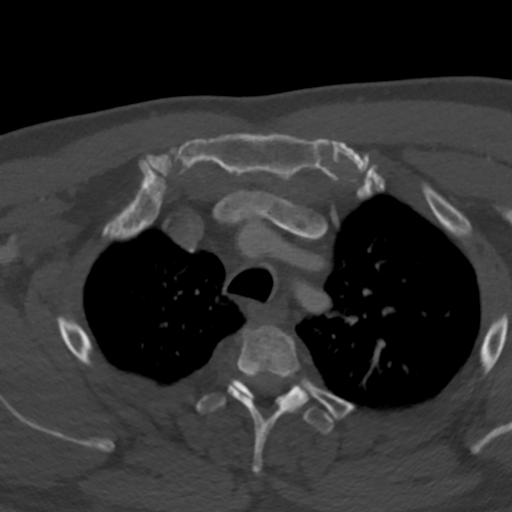
[im 208/762  brain]
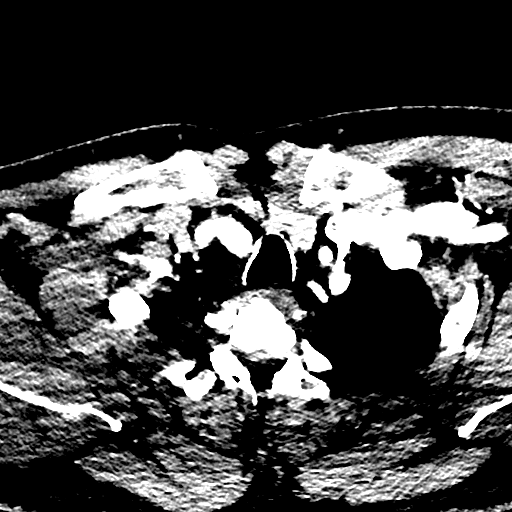
[im 277/762  bone]
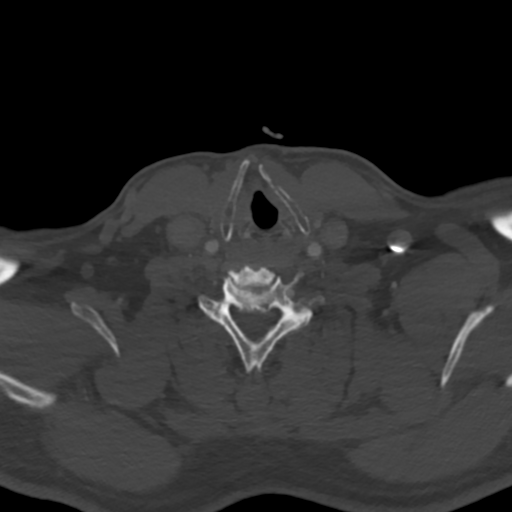
[im 312/762  brain]
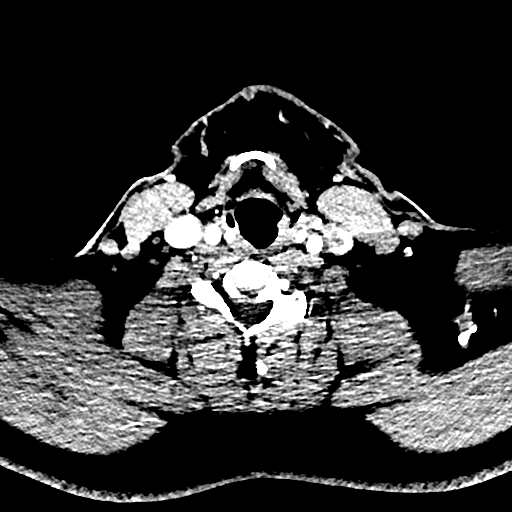
[im 346/762  bone]
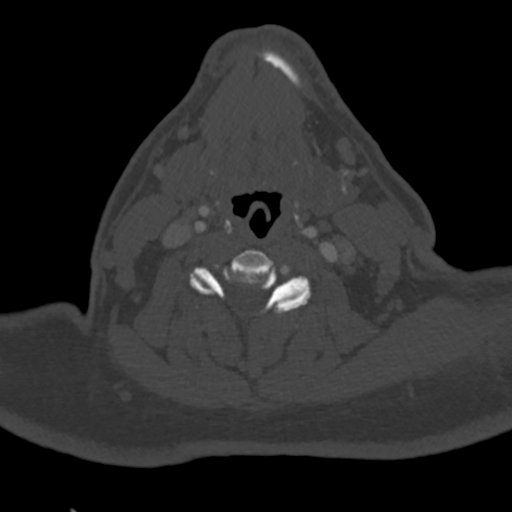
[im 416/762  brain]
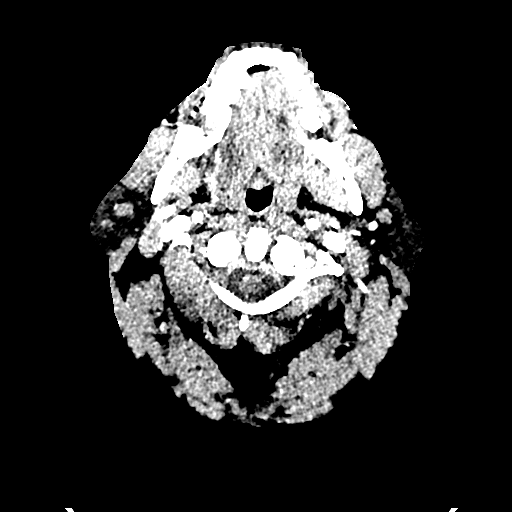
[im 450/762  bone]
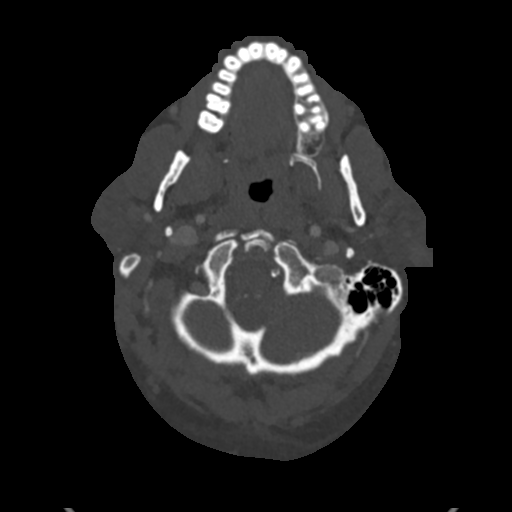
[im 485/762  brain]
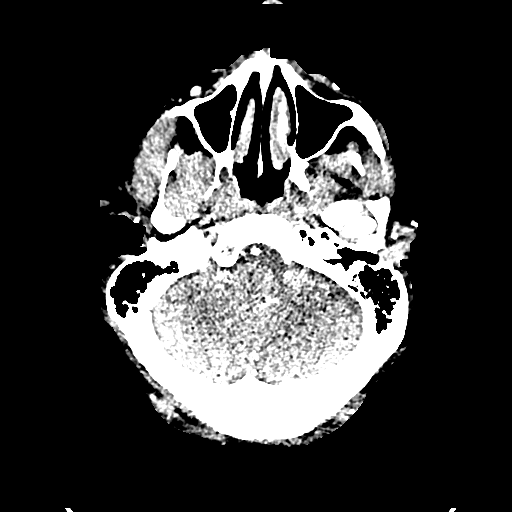
[im 554/762  bone]
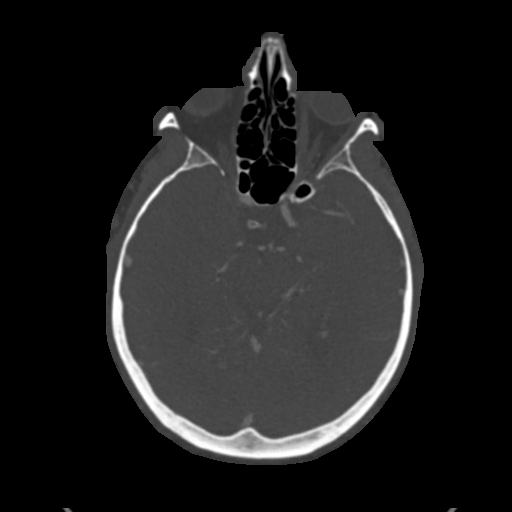
[im 589/762  brain]
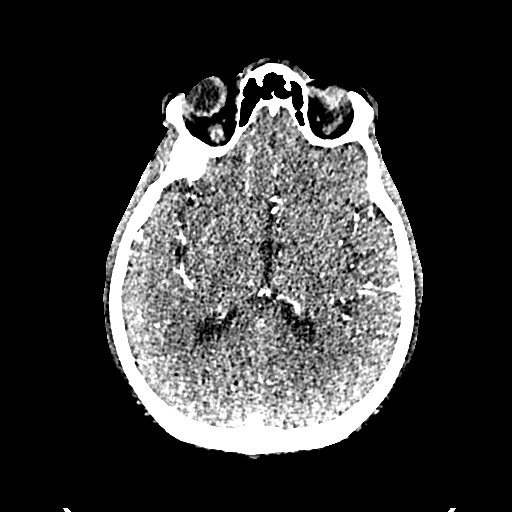
[im 623/762  bone]
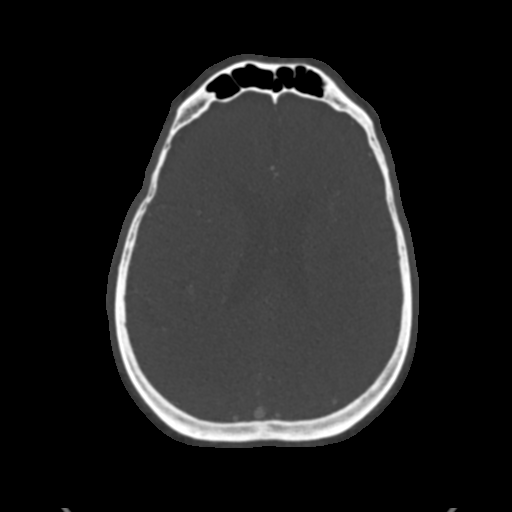
[im 692/762  brain]
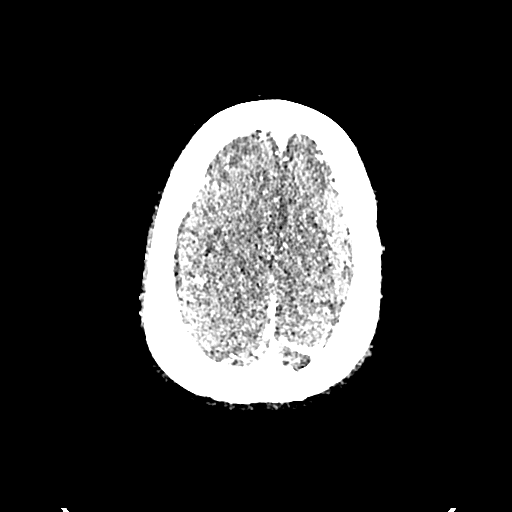
[im 727/762  bone]
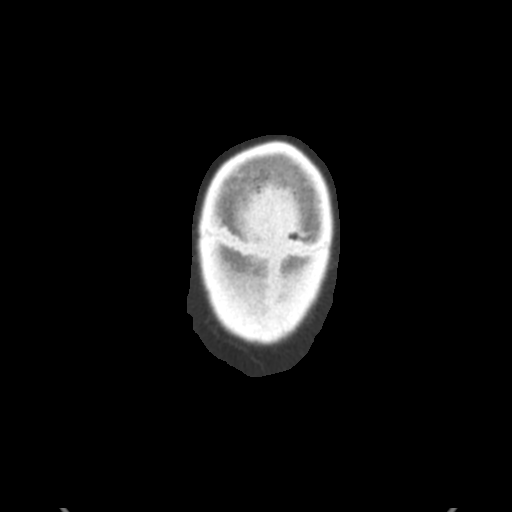

[Series 10: sagittal · sagittal · 0.34mm/px · 1 of 41 slices shown]
[im 13/41  brain]
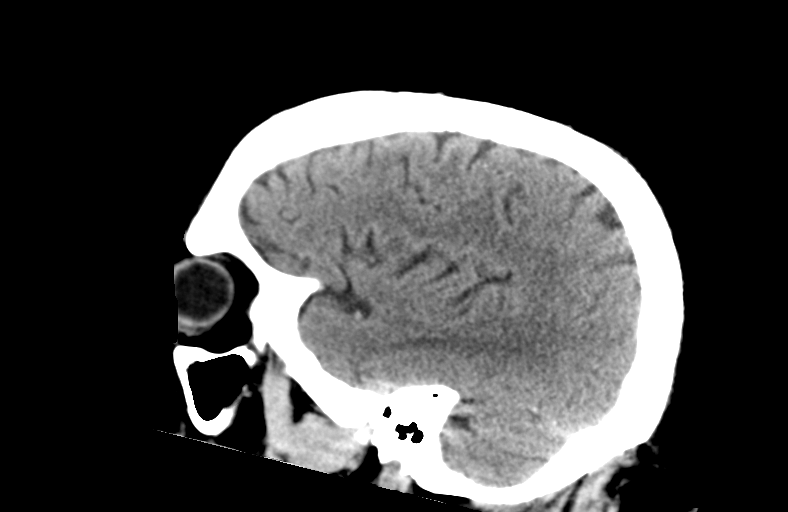

[17 of 47 positions shown; findings below may reference images not displayed]

FINDINGS: CT HEAD FINDINGS

Brain: Ventricle size normal. Scattered small white matter
hypodensities bilaterally are stable. No acute infarct. Negative for
acute hemorrhage or mass.

Vascular: Negative for hyperdense vessel

Skull: No acute abnormality

Sinuses: Negative

Orbits: Negative

Review of the MIP images confirms the above findings

CTA NECK FINDINGS

Aortic arch: Bovine branching arch. Proximal great vessels widely
patent. Aortic arch negative.

Right carotid system: Right carotid widely patent without
significant stenosis or dissection

Left carotid system: Left carotid widely patent without significant
stenosis or dissection.

Vertebral arteries: Left vertebral artery dominant and widely patent
to the basilar. Hypoplastic right vertebral artery patent to the
basilar

Skeleton: No acute skeletal abnormality.

Multiple areas of dental infection.

Other neck: Negative

Upper chest: Lung apices clear bilaterally.

Review of the MIP images confirms the above findings

CTA HEAD FINDINGS

Anterior circulation: Cavernous carotid patent bilaterally.
Scattered mild atherosclerotic disease in the middle cerebral artery
branches and M1 segments bilaterally. Severe stenosis left A2
segment. Moderate to severe stenosis right A2 segment.

Posterior circulation: Moderately severe calcific stenosis left V3
segment. Hypoplastic right vertebral artery patent to the basilar.
Basilar widely patent. PICA patent bilaterally. Moderate stenosis
distal right posterior cerebral artery unchanged. Severe stenosis
distal left posterior cerebral artery unchanged. Fetal origin left
posterior cerebral artery.

Venous sinuses: Normal venous enhancement.

Anatomic variants: None

Review of the MIP images confirms the above findings
IMPRESSION: 1. Chronic microvascular ischemic change in the white matter. No
acute intracranial abnormality.
2. No significant carotid or vertebral artery stenosis in the neck
3. Extensive intracranial atherosclerotic disease. Bilateral
atherosclerotic disease in the anterior, middle, and posterior
cerebral arteries appear stable from the prior CTA.

## 2020-08-26 ENCOUNTER — Encounter (HOSPITAL_COMMUNITY): Payer: Self-pay

## 2020-08-26 ENCOUNTER — Emergency Department (HOSPITAL_COMMUNITY): Payer: Managed Care, Other (non HMO)

## 2020-08-26 ENCOUNTER — Emergency Department (HOSPITAL_COMMUNITY)
Admission: EM | Admit: 2020-08-26 | Discharge: 2020-08-27 | Disposition: A | Discharge status: Home / Self Care | Payer: Managed Care, Other (non HMO) | Attending: Emergency Medicine | Admitting: Emergency Medicine

## 2020-08-26 DIAGNOSIS — R2689 Other abnormalities of gait and mobility: Secondary | ICD-10-CM | POA: Insufficient documentation

## 2020-08-26 DIAGNOSIS — Z7982 Long term (current) use of aspirin: Secondary | ICD-10-CM | POA: Diagnosis not present

## 2020-08-26 DIAGNOSIS — R358 Other polyuria: Secondary | ICD-10-CM | POA: Insufficient documentation

## 2020-08-26 DIAGNOSIS — Z79899 Other long term (current) drug therapy: Secondary | ICD-10-CM | POA: Diagnosis not present

## 2020-08-26 DIAGNOSIS — E1165 Type 2 diabetes mellitus with hyperglycemia: Secondary | ICD-10-CM | POA: Diagnosis not present

## 2020-08-26 DIAGNOSIS — R631 Polydipsia: Secondary | ICD-10-CM | POA: Diagnosis not present

## 2020-08-26 DIAGNOSIS — R42 Dizziness and giddiness: Secondary | ICD-10-CM | POA: Diagnosis present

## 2020-08-26 DIAGNOSIS — I10 Essential (primary) hypertension: Secondary | ICD-10-CM | POA: Diagnosis not present

## 2020-08-26 LAB — PROTIME-INR
INR: 1 (ref 0.8–1.2)
Prothrombin Time: 13 seconds (ref 11.4–15.2)

## 2020-08-26 LAB — DIFFERENTIAL
Abs Immature Granulocytes: 0.07 10*3/uL (ref 0.00–0.07)
Basophils Absolute: 0 10*3/uL (ref 0.0–0.1)
Basophils Relative: 0 %
Eosinophils Absolute: 0.1 10*3/uL (ref 0.0–0.5)
Eosinophils Relative: 1 %
Immature Granulocytes: 1 %
Lymphocytes Relative: 27 %
Lymphs Abs: 2.5 10*3/uL (ref 0.7–4.0)
Monocytes Absolute: 0.7 10*3/uL (ref 0.1–1.0)
Monocytes Relative: 8 %
Neutro Abs: 5.9 10*3/uL (ref 1.7–7.7)
Neutrophils Relative %: 63 %

## 2020-08-26 LAB — CBC
HCT: 42.6 % (ref 39.0–52.0)
Hemoglobin: 15 g/dL (ref 13.0–17.0)
MCH: 32.9 pg (ref 26.0–34.0)
MCHC: 35.2 g/dL (ref 30.0–36.0)
MCV: 93.4 fL (ref 80.0–100.0)
Platelets: 259 10*3/uL (ref 150–400)
RBC: 4.56 MIL/uL (ref 4.22–5.81)
RDW: 12.7 % (ref 11.5–15.5)
WBC: 9.3 10*3/uL (ref 4.0–10.5)
nRBC: 0 % (ref 0.0–0.2)

## 2020-08-26 LAB — COMPREHENSIVE METABOLIC PANEL
ALT: 35 U/L (ref 0–44)
AST: 27 U/L (ref 15–41)
Albumin: 3.9 g/dL (ref 3.5–5.0)
Alkaline Phosphatase: 69 U/L (ref 38–126)
Anion gap: 15 (ref 5–15)
BUN: 37 mg/dL — ABNORMAL HIGH (ref 8–23)
CO2: 21 mmol/L — ABNORMAL LOW (ref 22–32)
Calcium: 9.1 mg/dL (ref 8.9–10.3)
Chloride: 89 mmol/L — ABNORMAL LOW (ref 98–111)
Creatinine, Ser: 1.92 mg/dL — ABNORMAL HIGH (ref 0.61–1.24)
GFR calc Af Amer: 42 mL/min — ABNORMAL LOW (ref >60)
GFR calc non Af Amer: 36 mL/min — ABNORMAL LOW (ref >60)
Glucose, Bld: 403 mg/dL — ABNORMAL HIGH (ref 70–99)
Potassium: 3.8 mmol/L (ref 3.5–5.1)
Sodium: 125 mmol/L — ABNORMAL LOW (ref 135–145)
Total Bilirubin: 1.4 mg/dL — ABNORMAL HIGH (ref 0.3–1.2)
Total Protein: 6.9 g/dL (ref 6.5–8.1)

## 2020-08-26 LAB — CBG MONITORING, ED: Glucose-Capillary: 405 mg/dL — ABNORMAL HIGH (ref 70–99)

## 2020-08-26 LAB — APTT: aPTT: 25 seconds (ref 24–36)

## 2020-08-26 MED ORDER — SODIUM CHLORIDE 0.9% FLUSH
3.0000 mL | Freq: Once | INTRAVENOUS | Status: AC
Start: 2020-08-26 — End: 2020-08-27
  Administered 2020-08-27: 3 mL via INTRAVENOUS

## 2020-08-26 NOTE — ED Triage Notes (Signed)
Pt states that about an hour ago he began to have dizziness and an unsteady gait, symptoms have now resolved and, neuro intact bilaterally. Hx of stroke and dementia.

## 2020-08-27 ENCOUNTER — Emergency Department (HOSPITAL_COMMUNITY): Payer: Managed Care, Other (non HMO)

## 2020-08-27 LAB — BASIC METABOLIC PANEL
Anion gap: 14 (ref 5–15)
BUN: 36 mg/dL — ABNORMAL HIGH (ref 8–23)
CO2: 20 mmol/L — ABNORMAL LOW (ref 22–32)
Calcium: 8.3 mg/dL — ABNORMAL LOW (ref 8.9–10.3)
Chloride: 94 mmol/L — ABNORMAL LOW (ref 98–111)
Creatinine, Ser: 1.88 mg/dL — ABNORMAL HIGH (ref 0.61–1.24)
GFR calc Af Amer: 43 mL/min — ABNORMAL LOW (ref >60)
GFR calc non Af Amer: 37 mL/min — ABNORMAL LOW (ref >60)
Glucose, Bld: 344 mg/dL — ABNORMAL HIGH (ref 70–99)
Potassium: 4.1 mmol/L (ref 3.5–5.1)
Sodium: 128 mmol/L — ABNORMAL LOW (ref 135–145)

## 2020-08-27 LAB — CBG MONITORING, ED: Glucose-Capillary: 331 mg/dL — ABNORMAL HIGH (ref 70–99)

## 2020-08-27 MED ORDER — SODIUM CHLORIDE 0.9 % IV BOLUS
1000.0000 mL | Freq: Once | INTRAVENOUS | Status: AC
  Administered 2020-08-27: 1000 mL via INTRAVENOUS

## 2020-08-27 MED ORDER — INSULIN ASPART 100 UNIT/ML ~~LOC~~ SOLN
5.0000 [IU] | Freq: Once | SUBCUTANEOUS | Status: AC
  Administered 2020-08-27: 5 [IU] via SUBCUTANEOUS

## 2020-08-27 NOTE — ED Provider Notes (Signed)
[facility] EMERGENCY DEPARTMENT Provider Note   CSN: [id number] Arrival date & time: 08/26/20  2219     History Chief Complaint  Patient presents with  . Transient Ischemic Attack    Jesse Black is a 64 y.o. male with a history of CVA, hyperlipidemia, prediabetes, OSA on CPAP, CKD stage 3B, frontotemporal dementia, and HTN who presents to the emergency department with a chief complaint of dizziness.  The patient reports that he was getting out of his recliner to pick up a remote that had dropped on the floor approximately 1 hour prior to arrival.  After picking it up, states that he began feeling dizzy.  Reports that his PCP previously told him that when he goes to stand that he should take about 6 to 7 seconds before standing to make sure that he does not get off balance, but states that he did not wait at that time.  Dizziness seemed to resolved until he began to walk down the hall when he again felt dizzy and off balance and fell against the wall.  Symptoms intermittently persisted and he fell again in another room of the home.  He denies numbness, weakness, headache, visual changes, slurred speech, nausea, vomiting, syncope, seizure-like activity, neck pain or stiffness, rash, chest pain, shortness of breath, abdominal pain.  Symptoms have resolved at this time.  Per the patient's wife, when the patient had previous stroke in February 2020 he presented "as if he was drunk".  Speech was noted to have been somewhat slurred.  Patient's blood sugar has also been running high.  He has been having polyuria polydipsia.  His wife reports that he has had readings of 588 and 578 earlier in the day.  He was seen by his PCP for this earlier in the day, but when they went to the pharmacy to pick up his medication insulin had been prescribed and sent on oral antihyperglycemic.  Patient was also seen earlier in the day by ophthalmology after leaving a contact in his left eye  unknowingly for about a week.  He was started on antibiotics.  Per chart review, patient does have a history of alcohol use.  Denies recent alcohol use.  The history is provided by the patient and medical records. No language interpreter was used.       Past Medical History:  Diagnosis Date  . High blood pressure   . Hyperlipidemia   . Memory loss   . Prediabetes   . Stroke (cerebrum) (HCC)   . Stroke [facility])     Patient Active Problem List   Diagnosis Date Noted  . Erectile dysfunction 10/02/2019  . Prediabetes 05/17/2019  . OSA on CPAP 05/17/2019  . Fatty liver 04/17/2019  . Gallbladder polyp 04/17/2019  . Insomnia 02/28/2019  . Hyperlipidemia 01/31/2019  . Intracranial atherosclerosis 01/31/2019  . 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  . Vitamin D deficiency 01/31/2019  . CVA (cerebral vascular accident) (HCC) 01/24/2019  . Annual physical exam 07/31/2015  . Hypertension 07/31/2015    Past Surgical History:  Procedure Laterality Date  . COLONOSCOPY WITH PROPOFOL N/A 10/05/2015   Procedure: COLONOSCOPY WITH PROPOFOL;  Surgeon: Scot Jun, MD;  Location: [facility] ENDOSCOPY;  Service: Endoscopy;  Laterality: N/A;  . IR ANGIO INTRA EXTRACRAN SEL COM CAROTID INNOMINATE BILAT MOD SED  02/05/2019  . IR ANGIO VERTEBRAL SEL SUBCLAVIAN INNOMINATE UNI R MOD SED  02/05/2019  . IR ANGIO VERTEBRAL SEL VERTEBRAL UNI L MOD SED  02/05/2019  .  IR US GUIDE VASC ACCESS RIGHT  02/05/2019  . LOOP RECORDER INSERTION N/A 05/22/2019   Procedure: LOOP RECORDER INSERTION;  Surgeon: Marcina Millard, MD;  Location: [facility] INVASIVE CV LAB;  Service: Cardiovascular;  Laterality: N/A;       Family History  Problem Relation Age of Onset  . Lung cancer Father   . Dementia Mother     Social History   Tobacco Use  . Smoking status: Never Smoker  . Smokeless tobacco: Never Used  Vaping Use  . Vaping Use: Never used  Substance Use Topics  . Alcohol use: Yes    Comment: ?amt   .  Drug use: No    Home Medications Prior to Admission medications   Medication Sig Start Date End Date Taking? Authorizing Provider  acetaminophen (TYLENOL) 500 MG tablet Take 500 mg by mouth daily as needed for moderate pain or headache.    [provider]  amLODipine (NORVASC) 5 MG tablet Take 1 tablet (5 mg total) by mouth daily. 07/04/19   McLean-Scocuzza, Pasty Spillers, MD  aspirin EC 81 MG tablet Take 81 mg by mouth daily.    [provider]  atorvastatin (LIPITOR) 40 MG tablet Take 1 tablet (40 mg total) by mouth daily at 6 PM. 11/05/19   McLean-Scocuzza, Pasty Spillers, MD  calcium carbonate (TUMS - DOSED IN MG ELEMENTAL CALCIUM) 500 MG chewable tablet Chew 1 tablet by mouth daily as needed for indigestion or heartburn.    [provider]  clopidogrel (PLAVIX) 75 MG tablet Take 1 tablet (75 mg total) by mouth daily. Further refills Dr. Sherryll Burger 02/18/20   McLean-Scocuzza, Pasty Spillers, MD  donepezil (ARICEPT) 10 MG tablet Take 10 mg by mouth at bedtime.    Lonell Face, MD  hydrochlorothiazide (HYDRODIURIL) 25 MG tablet Take 25 mg by mouth every morning. 03/30/20   [provider]  Melatonin 5 MG CAPS Take 5 mg by mouth at bedtime as needed (sleep).    [provider]  metoprolol tartrate (LOPRESSOR) 25 MG tablet Take 1 tablet (25 mg total) by mouth 2 (two) times daily. 11/05/19   McLean-Scocuzza, Pasty Spillers, MD  Multiple Vitamin (MULTI-VITAMIN DAILY) TABS Take 1 tablet by mouth daily.    [provider]  neomycin-polymyxin-hydrocortisone (CORTISPORIN) OTIC solution Place 4 drops into the left ear 4 (four) times daily. x7-10 days Patient taking differently: Place 4 drops into the left ear 4 (four) times daily as needed (itchiness in the ear). x7-10 days 02/28/19   McLean-Scocuzza, Pasty Spillers, MD  olmesartan (BENICAR) 40 MG tablet Take 40 mg by mouth every morning. 03/30/20   [provider]  olmesartan-hydrochlorothiazide (BENICAR HCT) 40-25 MG tablet Take 1  tablet by mouth daily. In am, if needs to take 2 separate pills of same dose ok. After this refill pt found another PCP no longer will get refills Dr. Lonie Peak Patient not taking: Reported on 04/28/2020 03/06/20   McLean-Scocuzza, Pasty Spillers, MD  omega-3 acid ethyl esters (LOVAZA) 1 g capsule Take 1 capsule (1 g total) by mouth daily. 06/11/19   McLean-Scocuzza, Pasty Spillers, MD  Tetrahydrozoline HCl (VISINE OP) Place 1 drop into both eyes daily as needed (dry eyes).    [provider]    Allergies    Patient has no known allergies.  Review of Systems   Review of Systems  Constitutional: Negative for appetite change, chills and fever.  HENT: Negative for congestion and sore throat.   Respiratory: Negative for shortness of breath and  wheezing.   Cardiovascular: Negative for chest pain, palpitations and leg swelling.  Gastrointestinal: Negative for abdominal pain, diarrhea, nausea and vomiting.  Endocrine: Positive for polydipsia and polyuria.  Genitourinary: Negative for dysuria.  Musculoskeletal: Positive for gait problem. Negative for back pain, myalgias, neck pain and neck stiffness.  Skin: Negative for rash.  Allergic/Immunologic: Negative for immunocompromised state.  Neurological: Positive for dizziness. Negative for seizures, syncope, speech difficulty, weakness, numbness and headaches.  Psychiatric/Behavioral: Negative for confusion.    Physical Exam Updated Vital Signs BP 109/78   Pulse 69   Temp 98 F (36.7 C)   Resp 16   SpO2 95%   Physical Exam Vitals and nursing note reviewed.  Constitutional:      General: He is not in acute distress.    Appearance: He is well-developed. He is not ill-appearing, toxic-appearing or diaphoretic.  HENT:     Head: Normocephalic.  Eyes:     Extraocular Movements: Extraocular movements intact.     Conjunctiva/sclera: Conjunctivae normal.     Pupils: Pupils are equal, round, and reactive to light.  Cardiovascular:     Rate and Rhythm:  Normal rate and regular rhythm.     Pulses: Normal pulses.     Heart sounds: Normal heart sounds. No murmur heard.  No friction rub. No gallop.   Pulmonary:     Effort: Pulmonary effort is normal. No respiratory distress.     Breath sounds: No stridor. No wheezing, rhonchi or rales.  Chest:     Chest wall: No tenderness.  Abdominal:     General: There is no distension.     Palpations: Abdomen is soft. There is no mass.     Tenderness: There is no abdominal tenderness. There is no right CVA tenderness, left CVA tenderness, guarding or rebound.     Hernia: No hernia is present.  Musculoskeletal:        General: No tenderness.     Cervical back: Normal range of motion and neck supple.     Right lower leg: No edema.     Left lower leg: No edema.  Skin:    General: Skin is warm and dry.     Capillary Refill: Capillary refill takes less than 2 seconds.  Neurological:     Mental Status: He is alert. He is disoriented.     Comments: Alert.  Moves all 4 extremities spontaneously.  5 out of 5 strength against resistance of the large muscle groups of the upper and lower extremities.  Sensation is intact and equal throughout.  Heel-to-shin is intact bilaterally.  Finger-to-nose with dysmetria on the right.  Negative Romberg.  Gait is ataxic.  No clonus bilaterally.  Follows simple commands.  Speech is not slurred.  Psychiatric:        Behavior: Behavior normal.     ED Results / Procedures / Treatments   Labs (all labs ordered are listed, but only abnormal results are displayed) Labs Reviewed  COMPREHENSIVE METABOLIC PANEL - Abnormal; Notable for the following components:      Result Value   Sodium 125 (*)    Chloride 89 (*)    CO2 21 (*)    Glucose, Bld 403 (*)    BUN 37 (*)    Creatinine, Ser 1.92 (*)    Total Bilirubin 1.4 (*)    GFR calc non Af Amer 36 (*)    GFR calc Af Amer 42 (*)    All other components within normal limits  BASIC METABOLIC  PANEL - Abnormal; Notable for the  following components:   Sodium 128 (*)    Chloride 94 (*)    CO2 20 (*)    Glucose, Bld 344 (*)    BUN 36 (*)    Creatinine, Ser 1.88 (*)    Calcium 8.3 (*)    GFR calc non Af Amer 37 (*)    GFR calc Af Amer 43 (*)    All other components within normal limits  CBG MONITORING, ED - Abnormal; Notable for the following components:   Glucose-Capillary 405 (*)    All other components within normal limits  CBG MONITORING, ED - Abnormal; Notable for the following components:   Glucose-Capillary 331 (*)    All other components within normal limits  PROTIME-INR  APTT  CBC  DIFFERENTIAL  I-STAT CHEM 8, ED    EKG EKG Interpretation  Date/Time:  Wednesday August 26 2020 22:36:20 EDT Ventricular Rate:  88 PR Interval:  196 QRS Duration: 98 QT Interval:  398 QTC Calculation: 481 R Axis:   90 Text Interpretation: Normal sinus rhythm Rightward axis Anterior infarct , age undetermined Abnormal ECG Interpretation limited secondary to artifact No significant change was found Confirmed by Glynn Octaveancour, Stephen [phone]([id number]) on 08/27/2020 3:46:31 AM   Radiology CT HEAD WO CONTRAST  Result Date: 08/27/2020 CLINICAL DATA:  Transient ischemic attack, dizziness, unsteady gait EXAM: CT HEAD WITHOUT CONTRAST TECHNIQUE: Contiguous axial images were obtained from the base of the skull through the vertex without intravenous contrast. COMPARISON:  CT head 10/01/2019 FINDINGS: Brain: Mild patchy and confluent areas of decreased attenuation are noted throughout the deep and periventricular white matter of the cerebral hemispheres bilaterally, compatible with chronic microvascular ischemic disease. No evidence of large-territorial acute infarction. No parenchymal hemorrhage. No mass lesion. No extra-axial collection. No mass effect or midline shift. No hydrocephalus. Basilar cisterns are patent. Vascular: No hyperdense vessel or unexpected calcification. Skull: Negative for fracture or focal lesion. Sinuses/Orbits:  Paranasal sinuses and mastoid air cells are clear. The orbits are unremarkable. Other: Atherosclerotic calcifications are present within the cavernous internal carotid arteries and the left vertebral artery. IMPRESSION: No intracranial abnormality. Electronically Signed   By: Tish FredericksonMorgane  Naveau M.D.   On: 08/27/2020 00:02   MR BRAIN WO CONTRAST  Result Date: 08/27/2020 CLINICAL DATA:  Persistent or recurrent dizziness EXAM: MRI HEAD WITHOUT CONTRAST TECHNIQUE: Multiplanar, multiecho pulse sequences of the brain and surrounding structures were obtained without intravenous contrast. COMPARISON:  None. FINDINGS: Brain: No acute infarction, hemorrhage, hydrocephalus, extra-axial collection or mass lesion. Chronic small vessel ischemic change in the cerebral white matter with chronic lacune at the bilateral corona radiata/centrum semiovale. Mild chronic small vessel changes in the upper brainstem. Possible remote lacunar infarct at the right medial thalamus. Few remote micro hemorrhages at the deep gray nuclei and pons. Brain volume is age normal. Vascular: Normal flow voids Skull and upper cervical spine: Normal marrow signal Sinuses/Orbits: Negative IMPRESSION: 1. No emergent finding. 2. Moderate chronic small vessel ischemia. Electronically Signed   By: Marnee SpringJonathon  Watts M.D.   On: 08/27/2020 07:25    Procedures Procedures (including critical care time)  Medications Ordered in ED Medications  sodium chloride flush (NS) 0.9 % injection 3 mL (3 mLs Intravenous Given 08/27/20 0357)  sodium chloride 0.9 % bolus 1,000 mL (0 mLs Intravenous Stopped 08/27/20 0650)  insulin aspart (novoLOG) injection 5 Units (5 Units Subcutaneous Given 08/27/20 [id number])    ED Course  I have reviewed the triage vital signs and the nursing  notes.  Pertinent labs & imaging results that were available during my care of the patient were reviewed by me and considered in my medical decision making (see chart for details).    MDM  Rules/Calculators/A&P                          64 year old male with a history of CVA, hyperlipidemia, prediabetes, OSA on CPAP, CKD stage 3B, frontotemporal dementia, and HTN presenting with dizziness and gait instability, onset tonight.  There is concerned that symptoms are similar to patient's previous stroke in February 2020.  Vital signs have overall been stable.  There were 2 documented hypotensive blood pressures, but pressures then normalized without treatment.  The patient was seen and independently evaluated by Dr. Manus Gunning, attending physician.  Given patient's symptoms, MRI has been ordered to ensure that the patient has not had another stroke.  CT head is unremarkable.  Patient is also hyperglycemic with glucose of 403.  Bicarb and anion gap are normal.  No evidence of DKA or HHS.  Creatinine is elevated to 1.92, up from 1.29 1 year ago.  He is also hyponatremic to 125.  No history of hyponatremia.  Chloride is also low.  There is some concern for alcohol use and per chart review patient did have history of EtOH use, but denies current alcohol use.  Will give IV fluid bolus and plan to recheck.  Patient does appear dry and suspect that labs will significantly prove after fluids.  Patient care transferred to [facility] at the end of my shift to follow-up on MRI and repeat labs. Patient presentation, ED course, and plan of care discussed with review of all pertinent labs and imaging. Please see his/her note for further details regarding further ED course and disposition.   Final Clinical Impression(s) / ED Diagnoses Final diagnoses:  Hyperglycemia due to diabetes mellitus [facility])  Dizzy    Rx / DC Orders ED Discharge Orders    None       Barkley Boards, PA-C 08/27/20 0926    Glynn Octave, MD 08/27/20 1452

## 2020-08-27 NOTE — Discharge Instructions (Addendum)
Your symptoms is likely due to elevated blood sugar.  Please take medication previously prescribed and follow-up closely with your primary care doctor for further managements of your condition.  Return to ER if your symptoms worsen or if you have any other concern.

## 2020-08-27 NOTE — ED Provider Notes (Signed)
8:49 AM Received signout at the beginning of shift, please see previous providers notes for complete H&P. This is a 64 year old male history of prior stroke, diabetes presenting for evaluation of dizziness.  Symptoms last night when he got up from trying to pick up a remote control and while walking he experiencing dizziness in which he described more of a drunken sensation.  He was previously seen by his PCP the day before due to elevated blood sugar and was prescribed insulin.  Patient was found to be hyperglycemic with a CBG of 4 3 initially, and normal anion gap.  Initial sodium of 125 likely pseudohyponatremia.  Evidence of AKI with a BUN of 37, creatinine of 1.92.  It was 1.66 approximately 4 months ago and it was as high as 2.88 a year ago.  Patient was given IV fluid, labs was rechecked, and CBG improved to 344 with IV fluid as well as slight improvement of his creatinine to 1.88.  Sodium improved to 128.  MRI of the brain obtained showed no acute finding.  There is moderate chronic small vessel ischemia noted.  On reassessment, patient does feel a bit better.  I did recommend patient to follow-up closely with primary care provider for further managements of his diabetes.  Return precaution was given.  At this time he is stable for discharge.  Labs should be repeated by PCP in 1 week.  BP 96/70   Pulse 68   Temp 98.3 F (36.8 C) (Oral)   Resp 16   SpO2 94%   Results for orders placed or performed during the hospital encounter of 08/26/20  Protime-INR  Result Value Ref Range   Prothrombin Time 13.0 11.4 - 15.2 seconds   INR 1.0 0.8 - 1.2  APTT  Result Value Ref Range   aPTT 25 24 - 36 seconds  CBC  Result Value Ref Range   WBC 9.3 4.0 - 10.5 K/uL   RBC 4.56 4.22 - 5.81 MIL/uL   Hemoglobin 15.0 13.0 - 17.0 g/dL   HCT 54.0 39 - 52 %   MCV 93.4 80.0 - 100.0 fL   MCH 32.9 26.0 - 34.0 pg   MCHC 35.2 30.0 - 36.0 g/dL   RDW 08.6 76.1 - 95.0 %   Platelets 259 150 - 400 K/uL   nRBC  0.0 0.0 - 0.2 %  Differential  Result Value Ref Range   Neutrophils Relative % 63 %   Neutro Abs 5.9 1.7 - 7.7 K/uL   Lymphocytes Relative 27 %   Lymphs Abs 2.5 0.7 - 4.0 K/uL   Monocytes Relative 8 %   Monocytes Absolute 0.7 0 - 1 K/uL   Eosinophils Relative 1 %   Eosinophils Absolute 0.1 0 - 0 K/uL   Basophils Relative 0 %   Basophils Absolute 0.0 0 - 0 K/uL   Immature Granulocytes 1 %   Abs Immature Granulocytes 0.07 0.00 - 0.07 K/uL  Comprehensive metabolic panel  Result Value Ref Range   Sodium 125 (L) 135 - 145 mmol/L   Potassium 3.8 3.5 - 5.1 mmol/L   Chloride 89 (L) 98 - 111 mmol/L   CO2 21 (L) 22 - 32 mmol/L   Glucose, Bld 403 (H) 70 - 99 mg/dL   BUN 37 (H) 8 - 23 mg/dL   Creatinine, Ser 9.32 (H) 0.61 - 1.24 mg/dL   Calcium 9.1 8.9 - 67.1 mg/dL   Total Protein 6.9 6.5 - 8.1 g/dL   Albumin 3.9 3.5 - 5.0 g/dL  AST 27 15 - 41 U/L   ALT 35 0 - 44 U/L   Alkaline Phosphatase 69 38 - 126 U/L   Total Bilirubin 1.4 (H) 0.3 - 1.2 mg/dL   GFR calc non Af Amer 36 (L) >60 mL/min   GFR calc Af Amer 42 (L) >60 mL/min   Anion gap 15 5 - 15  Basic metabolic panel  Result Value Ref Range   Sodium 128 (L) 135 - 145 mmol/L   Potassium 4.1 3.5 - 5.1 mmol/L   Chloride 94 (L) 98 - 111 mmol/L   CO2 20 (L) 22 - 32 mmol/L   Glucose, Bld 344 (H) 70 - 99 mg/dL   BUN 36 (H) 8 - 23 mg/dL   Creatinine, Ser 2.99 (H) 0.61 - 1.24 mg/dL   Calcium 8.3 (L) 8.9 - 10.3 mg/dL   GFR calc non Af Amer 37 (L) >60 mL/min   GFR calc Af Amer 43 (L) >60 mL/min   Anion gap 14 5 - 15  CBG monitoring, ED  Result Value Ref Range   Glucose-Capillary 405 (H) 70 - 99 mg/dL  CBG monitoring, ED  Result Value Ref Range   Glucose-Capillary 331 (H) 70 - 99 mg/dL   CT HEAD WO CONTRAST  Result Date: 08/27/2020 CLINICAL DATA:  Transient ischemic attack, dizziness, unsteady gait EXAM: CT HEAD WITHOUT CONTRAST TECHNIQUE: Contiguous axial images were obtained from the base of the skull through the vertex without  intravenous contrast. COMPARISON:  CT head 10/01/2019 FINDINGS: Brain: Mild patchy and confluent areas of decreased attenuation are noted throughout the deep and periventricular white matter of the cerebral hemispheres bilaterally, compatible with chronic microvascular ischemic disease. No evidence of large-territorial acute infarction. No parenchymal hemorrhage. No mass lesion. No extra-axial collection. No mass effect or midline shift. No hydrocephalus. Basilar cisterns are patent. Vascular: No hyperdense vessel or unexpected calcification. Skull: Negative for fracture or focal lesion. Sinuses/Orbits: Paranasal sinuses and mastoid air cells are clear. The orbits are unremarkable. Other: Atherosclerotic calcifications are present within the cavernous internal carotid arteries and the left vertebral artery. IMPRESSION: No intracranial abnormality. Electronically Signed   By: Tish Frederickson M.D.   On: 08/27/2020 00:02   MR BRAIN WO CONTRAST  Result Date: 08/27/2020 CLINICAL DATA:  Persistent or recurrent dizziness EXAM: MRI HEAD WITHOUT CONTRAST TECHNIQUE: Multiplanar, multiecho pulse sequences of the brain and surrounding structures were obtained without intravenous contrast. COMPARISON:  None. FINDINGS: Brain: No acute infarction, hemorrhage, hydrocephalus, extra-axial collection or mass lesion. Chronic small vessel ischemic change in the cerebral white matter with chronic lacune at the bilateral corona radiata/centrum semiovale. Mild chronic small vessel changes in the upper brainstem. Possible remote lacunar infarct at the right medial thalamus. Few remote micro hemorrhages at the deep gray nuclei and pons. Brain volume is age normal. Vascular: Normal flow voids Skull and upper cervical spine: Normal marrow signal Sinuses/Orbits: Negative IMPRESSION: 1. No emergent finding. 2. Moderate chronic small vessel ischemia. Electronically Signed   By: Marnee Spring M.D.   On: 08/27/2020 07:25        Fayrene Helper, PA-C 08/27/20 [id number]    Melene Plan, DO 08/27/20 [phone]

## 2021-10-29 ENCOUNTER — Telehealth: Payer: Self-pay

## 2021-10-29 NOTE — Telephone Encounter (Signed)
Spoke with patient's wife Andrey Campanile and scheduled an in-person Palliative Consult for 11/16/21 @ 12:30PM.   COVID screening was negative. No pets in home. Patient lives with wife.  Consent obtained; updated Outlook/Netsmart/Team List and Epic.   Family is aware they may be receiving a call from provider the day before or day of to confirm appointment.

## 2021-11-16 ENCOUNTER — Other Ambulatory Visit: Payer: Medicare Other | Admitting: Student

## 2021-11-16 ENCOUNTER — Telehealth: Payer: Self-pay | Admitting: Student

## 2021-11-16 ENCOUNTER — Other Ambulatory Visit: Payer: Self-pay

## 2021-11-16 NOTE — Telephone Encounter (Signed)
Palliative patient called patient's wife to confirm visit. Message left. Received return call that wife will need to reschedule appointment.

## 2021-11-22 ENCOUNTER — Telehealth: Payer: Self-pay

## 2021-11-22 NOTE — Telephone Encounter (Signed)
Attempted to contact patient's wife Andrey Campanile to reschedule Palliative consult. No answer left a message to return call.

## 2021-12-20 ENCOUNTER — Other Ambulatory Visit: Payer: Medicare Other | Admitting: Student

## 2021-12-20 ENCOUNTER — Other Ambulatory Visit: Payer: Self-pay

## 2021-12-20 DIAGNOSIS — N183 Chronic kidney disease, stage 3 unspecified: Secondary | ICD-10-CM

## 2021-12-20 DIAGNOSIS — R52 Pain, unspecified: Secondary | ICD-10-CM

## 2021-12-20 DIAGNOSIS — F0153 Vascular dementia, unspecified severity, with mood disturbance: Secondary | ICD-10-CM

## 2021-12-20 DIAGNOSIS — Z515 Encounter for palliative care: Secondary | ICD-10-CM

## 2021-12-20 DIAGNOSIS — E1122 Type 2 diabetes mellitus with diabetic chronic kidney disease: Secondary | ICD-10-CM

## 2021-12-20 NOTE — Progress Notes (Addendum)
[facility] Consult Note Telephone: [phone]  Fax: [phone]   Date of encounter: 12/20/21 11:11 AM PATIENT NAME: Jesse Black [address] [place] [address]   [phone] (home)  DOB: [birth year] MRN: [id number] PRIMARY CARE PROVIDER:    Dr. Lynnda Shields PROVIDER:   Dr. Manuella Ghazi  RESPONSIBLE PARTY:    Contact Information     Name [place] Son [phone]  [phone]   Tr[place], [place] Daughter [phone]  [phone]        I met face to face with patient and family in the home. Palliative Care was asked to follow this patient by consultation request of  Dr. Manuella Ghazi to address advance care planning and complex medical decision making. This is the initial visit.                                     ASSESSMENT AND PLAN / RECOMMENDATIONS:   Advance Care Planning/Goals of Care: Goals include to maximize quality of life and symptom management. Patient/health care surrogate gave his/her permission to discuss.Our advance care planning conversation included a discussion about:    The value and importance of advance care planning  Experiences with loved ones who have been seriously ill or have died  Exploration of personal, cultural or spiritual beliefs that might influence medical decisions  Exploration of goals of care in the event of a sudden injury or illness  Identification  of a healthcare agent  MOST form reviewed and completed today. CODE STATUS: Full Code   Education provided on palliative medicine versus hospice services.  Reviewed MOST form today; form completed.  Palliative medicine will continue to provide supportive care.  I spent 20 minutes providing this consultation. More than 50% of the time in this consultation was spent in counseling and care coordination. ---------------------------------------------------------------------------- Symptom  Management/Plan:  Pain-to right shoulder; due to osteoarthritis.  Recommend acetaminophen 500 mg 3 times daily, start lidocaine  4% patch applied provide further on 12 hours off.  Follow-up with orthopedist as scheduled.  Vascular dementia-continue statin, aspirin as directed.  Reorient and redirect as needed. Monitor for cognitive and functional changes. Assist with adl's. LE weakness; declines therapy at this time.   T2DM- hemoglobin A1c 6.2. Blood sugars range from 150-170's mg/dL. Continue metformin as scheduled. Check blood sugar daily.   Follow up Palliative Care Visit: Palliative care will continue to follow for complex medical decision making, advance care planning, and clarification of goals. Return in 8 weeks or prn.  This visit was coded based on medical decision making (MDM).  PPS: 60%  HOSPICE ELIGIBILITY/DIAGNOSIS: TBD  Chief Complaint: Palliative Medicine initial visit.   HISTORY OF PRESENT ILLNESS:  Jesse Black is a 66 y.o. year old male  with vascular dementia, fronto-temporal dementia, CKD 3,  hypertension, hyperlipidemia, right shoulder osteoarthritis, depression, bipolar 1, OSA,  hx of CVA.  Resides at home with wife. States he is doing well. Denies pain, shortness of breath, denies needing assistance with adl's. Wife reports balance issues, shuffling gait, trouble from going from sitting to standing, endorses weakness to BLE, shoulder pain. Wife reports right shoulder pain; to see orthopedics. Patient has tried therapy in the past. Has tried ice to shoulder. Has had cortisone shots in the past; will no longer be receiving. He does see chiropractor. No falls reported. No recent infections. Blood sugars run 150-170's  mg/dL usually; no hypoglycemic episodes. He reports sleeping okay. Hasn't worn cpap in years; he states it keeps him up at night. A 10-point review of systems is negative, except for the pertinent positives and negatives detailed in the HPI.   History  obtained from review of EMR, discussion with primary team, and interview with family, facility staff/caregiver and/or Jesse Black.  I reviewed available labs, medications, imaging, studies and related documents from the EMR.  Records reviewed and summarized above.   Physical Exam:  Pulse 60, resp 16, b/p 114/72, sats 97% on room air Constitutional: NAD General: frail appearing EYES: anicteric sclera, lids intact, no discharge  ENMT: intact hearing, oral mucous membranes moist, dentition intact CV: S1S2, RRR, no LE edema Pulmonary: LCTA, no increased work of breathing, no cough, room air Abdomen: normo-active BS + 4 quadrants, soft and non tender, no ascites GU: deferred MSK: moves all extremities, ambulatory Skin: warm and dry, no rashes or wounds on visible skin Neuro:  no generalized weakness, A & O x 3, forgetful Psych: non-anxious affect Hem/lymph/immuno: no widespread bruising CURRENT PROBLEM LIST:  Patient Active Problem List   Diagnosis Date Noted   Erectile dysfunction 10/02/2019   Prediabetes 05/17/2019   OSA on CPAP 05/17/2019   Fatty liver 04/17/2019   Gallbladder polyp 04/17/2019   Insomnia 02/28/2019   Hyperlipidemia 01/31/2019   Intracranial atherosclerosis 01/31/2019   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   Vitamin D deficiency 01/31/2019   CVA (cerebral vascular accident) ([facility]) 01/24/2019   Annual physical exam 07/31/2015   Hypertension 07/31/2015   PAST MEDICAL HISTORY:  Active Ambulatory Problems    Diagnosis Date Noted   Annual physical exam 07/31/2015   Hypertension 07/31/2015   CVA (cerebral vascular accident) ([facility]) 01/24/2019   Prediabetes 05/17/2019   Hyperlipidemia 01/31/2019   Intracranial atherosclerosis 01/31/2019   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   Vitamin D deficiency 01/31/2019   Insomnia 02/28/2019   Fatty liver 04/17/2019   Gallbladder polyp 04/17/2019   OSA on CPAP 05/17/2019   Erectile dysfunction 10/02/2019    Resolved Ambulatory Problems    Diagnosis Date Noted   Hypertensive urgency 07/30/2015   Essential hypertension 07/30/2015   Elevated liver enzymes 01/31/2019   Past Medical History:  Diagnosis Date   High blood pressure    Memory loss    Stroke (cerebrum) (HCC)    Stroke (HCC)    SOCIAL HX:  Social History   Tobacco Use   Smoking status: Never   Smokeless tobacco: Never  Substance Use Topics   Alcohol use: Yes    Comment: ?amt    FAMILY HX:  Family History  Problem Relation Age of Onset   Lung cancer Father    Dementia Mother       ALLERGIES: No Known Allergies   PERTINENT MEDICATIONS:  Outpatient Encounter Medications as of 12/20/2021  Medication Sig   acetaminophen (TYLENOL) 500 MG tablet Take 500 mg by mouth daily as needed for moderate pain or headache.   amLODipine (NORVASC) 5 MG tablet Take 1 tablet (5 mg total) by mouth daily.   aspirin EC 81 MG tablet Take 81 mg by mouth daily.   atorvastatin (LIPITOR) 40 MG tablet Take 1 tablet (40 mg total) by mouth daily at 6 PM.   calcium carbonate (TUMS - DOSED IN MG ELEMENTAL CALCIUM) 500 MG chewable tablet Chew 1 tablet by mouth daily as needed for indigestion or heartburn.   clopidogrel (PLAVIX) 75 MG tablet Take 1 tablet (75  mg total) by mouth daily. Further refills Dr. Manuella Ghazi   donepezil (ARICEPT) 10 MG tablet Take 10 mg by mouth at bedtime.   hydrochlorothiazide (HYDRODIURIL) 25 MG tablet Take 25 mg by mouth every morning.   Melatonin 5 MG CAPS Take 5 mg by mouth at bedtime as needed (sleep).   metoprolol tartrate (LOPRESSOR) 25 MG tablet Take 1 tablet (25 mg total) by mouth 2 (two) times daily.   Multiple Vitamin (MULTI-VITAMIN DAILY) TABS Take 1 tablet by mouth daily.   neomycin-polymyxin-hydrocortisone (CORTISPORIN) OTIC solution Place 4 drops into the left ear 4 (four) times daily. x7-10 days (Patient taking differently: Place 4 drops into the left ear 4 (four) times daily as needed (itchiness in the ear). x7-10  days)   olmesartan (BENICAR) 40 MG tablet Take 40 mg by mouth every morning.   olmesartan-hydrochlorothiazide (BENICAR HCT) 40-25 MG tablet Take 1 tablet by mouth daily. In am, if needs to take 2 separate pills of same dose ok. After this refill pt found another PCP no longer will get refills Dr. Gayland Curry (Patient not taking: Reported on 04/28/2020)   omega-3 acid ethyl esters (LOVAZA) 1 g capsule Take 1 capsule (1 g total) by mouth daily.   Tetrahydrozoline HCl (VISINE OP) Place 1 drop into both eyes daily as needed (dry eyes).   No facility-administered encounter medications on file as of 12/20/2021.   Thank you for the opportunity to participate in the care of Jesse Black.  The palliative care team will continue to follow. Please call our office at [phone] if we can be of additional assistance.   Ezekiel Slocumb, NP   COVID-19 PATIENT SCREENING TOOL Asked and negative response unless otherwise noted:  Have you had symptoms of covid, tested positive or been in contact with someone with symptoms/positive test in the past 5-10 days? No

## 2021-12-24 ENCOUNTER — Inpatient Hospital Stay
Admission: EM | Admit: 2021-12-24 | Discharge: 2021-12-27 | DRG: 178 | Disposition: A | Discharge status: Home / Self Care | Payer: Medicare Other | Attending: Internal Medicine | Admitting: Internal Medicine

## 2021-12-24 ENCOUNTER — Emergency Department: Payer: Medicare Other

## 2021-12-24 ENCOUNTER — Other Ambulatory Visit: Payer: Self-pay

## 2021-12-24 ENCOUNTER — Encounter: Payer: Self-pay | Admitting: Emergency Medicine

## 2021-12-24 DIAGNOSIS — G9340 Encephalopathy, unspecified: Secondary | ICD-10-CM | POA: Diagnosis not present

## 2021-12-24 DIAGNOSIS — Z6833 Body mass index (BMI) 33.0-33.9, adult: Secondary | ICD-10-CM

## 2021-12-24 DIAGNOSIS — R4189 Other symptoms and signs involving cognitive functions and awareness: Secondary | ICD-10-CM | POA: Diagnosis present

## 2021-12-24 DIAGNOSIS — Z9989 Dependence on other enabling machines and devices: Secondary | ICD-10-CM

## 2021-12-24 DIAGNOSIS — R410 Disorientation, unspecified: Secondary | ICD-10-CM | POA: Diagnosis not present

## 2021-12-24 DIAGNOSIS — U071 COVID-19: Secondary | ICD-10-CM | POA: Diagnosis not present

## 2021-12-24 DIAGNOSIS — Z23 Encounter for immunization: Secondary | ICD-10-CM

## 2021-12-24 DIAGNOSIS — R531 Weakness: Secondary | ICD-10-CM

## 2021-12-24 DIAGNOSIS — E785 Hyperlipidemia, unspecified: Secondary | ICD-10-CM | POA: Diagnosis present

## 2021-12-24 DIAGNOSIS — G4733 Obstructive sleep apnea (adult) (pediatric): Secondary | ICD-10-CM

## 2021-12-24 DIAGNOSIS — J029 Acute pharyngitis, unspecified: Secondary | ICD-10-CM | POA: Diagnosis present

## 2021-12-24 DIAGNOSIS — N183 Chronic kidney disease, stage 3 unspecified: Secondary | ICD-10-CM

## 2021-12-24 DIAGNOSIS — G4700 Insomnia, unspecified: Secondary | ICD-10-CM | POA: Diagnosis present

## 2021-12-24 DIAGNOSIS — E559 Vitamin D deficiency, unspecified: Secondary | ICD-10-CM | POA: Diagnosis present

## 2021-12-24 DIAGNOSIS — I2510 Atherosclerotic heart disease of native coronary artery without angina pectoris: Secondary | ICD-10-CM | POA: Diagnosis present

## 2021-12-24 DIAGNOSIS — Z7982 Long term (current) use of aspirin: Secondary | ICD-10-CM

## 2021-12-24 DIAGNOSIS — N1831 Chronic kidney disease, stage 3a: Secondary | ICD-10-CM | POA: Diagnosis present

## 2021-12-24 DIAGNOSIS — Z7902 Long term (current) use of antithrombotics/antiplatelets: Secondary | ICD-10-CM

## 2021-12-24 DIAGNOSIS — Z79899 Other long term (current) drug therapy: Secondary | ICD-10-CM

## 2021-12-24 DIAGNOSIS — F03A18 Unspecified dementia, mild, with other behavioral disturbance: Secondary | ICD-10-CM | POA: Diagnosis present

## 2021-12-24 DIAGNOSIS — R7303 Prediabetes: Secondary | ICD-10-CM | POA: Diagnosis present

## 2021-12-24 DIAGNOSIS — I639 Cerebral infarction, unspecified: Secondary | ICD-10-CM | POA: Diagnosis present

## 2021-12-24 DIAGNOSIS — J1282 Pneumonia due to coronavirus disease 2019: Secondary | ICD-10-CM | POA: Diagnosis present

## 2021-12-24 DIAGNOSIS — Z794 Long term (current) use of insulin: Secondary | ICD-10-CM

## 2021-12-24 DIAGNOSIS — E1140 Type 2 diabetes mellitus with diabetic neuropathy, unspecified: Secondary | ICD-10-CM | POA: Diagnosis present

## 2021-12-24 DIAGNOSIS — E1169 Type 2 diabetes mellitus with other specified complication: Secondary | ICD-10-CM

## 2021-12-24 DIAGNOSIS — E1122 Type 2 diabetes mellitus with diabetic chronic kidney disease: Secondary | ICD-10-CM | POA: Diagnosis present

## 2021-12-24 DIAGNOSIS — G9349 Other encephalopathy: Secondary | ICD-10-CM | POA: Diagnosis present

## 2021-12-24 DIAGNOSIS — I129 Hypertensive chronic kidney disease with stage 1 through stage 4 chronic kidney disease, or unspecified chronic kidney disease: Secondary | ICD-10-CM | POA: Diagnosis present

## 2021-12-24 DIAGNOSIS — K760 Fatty (change of) liver, not elsewhere classified: Secondary | ICD-10-CM | POA: Diagnosis present

## 2021-12-24 DIAGNOSIS — I10 Essential (primary) hypertension: Secondary | ICD-10-CM | POA: Diagnosis present

## 2021-12-24 DIAGNOSIS — E669 Obesity, unspecified: Secondary | ICD-10-CM | POA: Diagnosis present

## 2021-12-24 DIAGNOSIS — Z8673 Personal history of transient ischemic attack (TIA), and cerebral infarction without residual deficits: Secondary | ICD-10-CM

## 2021-12-24 DIAGNOSIS — Z7984 Long term (current) use of oral hypoglycemic drugs: Secondary | ICD-10-CM

## 2021-12-24 LAB — COMPREHENSIVE METABOLIC PANEL
ALT: 54 U/L — ABNORMAL HIGH (ref 0–44)
AST: 38 U/L (ref 15–41)
Albumin: 4.6 g/dL (ref 3.5–5.0)
Alkaline Phosphatase: 73 U/L (ref 38–126)
Anion gap: 10 (ref 5–15)
BUN: 21 mg/dL (ref 8–23)
CO2: 28 mmol/L (ref 22–32)
Calcium: 11 mg/dL — ABNORMAL HIGH (ref 8.9–10.3)
Chloride: 99 mmol/L (ref 98–111)
Creatinine, Ser: 1.51 mg/dL — ABNORMAL HIGH (ref 0.61–1.24)
GFR, Estimated: 51 mL/min — ABNORMAL LOW (ref >60)
Glucose, Bld: 175 mg/dL — ABNORMAL HIGH (ref 70–99)
Potassium: 4.1 mmol/L (ref 3.5–5.1)
Sodium: 137 mmol/L (ref 135–145)
Total Bilirubin: 1 mg/dL (ref 0.3–1.2)
Total Protein: 7.8 g/dL (ref 6.5–8.1)

## 2021-12-24 LAB — DIFFERENTIAL
Abs Immature Granulocytes: 0.03 10*3/uL (ref 0.00–0.07)
Basophils Absolute: 0 10*3/uL (ref 0.0–0.1)
Basophils Relative: 0 %
Eosinophils Absolute: 0.1 10*3/uL (ref 0.0–0.5)
Eosinophils Relative: 1 %
Immature Granulocytes: 0 %
Lymphocytes Relative: 8 %
Lymphs Abs: 0.8 10*3/uL (ref 0.7–4.0)
Monocytes Absolute: 1.2 10*3/uL — ABNORMAL HIGH (ref 0.1–1.0)
Monocytes Relative: 11 %
Neutro Abs: 8 10*3/uL — ABNORMAL HIGH (ref 1.7–7.7)
Neutrophils Relative %: 80 %

## 2021-12-24 LAB — CBC
HCT: 41.6 % (ref 39.0–52.0)
Hemoglobin: 15.2 g/dL (ref 13.0–17.0)
MCH: 33.6 pg (ref 26.0–34.0)
MCHC: 36.5 g/dL — ABNORMAL HIGH (ref 30.0–36.0)
MCV: 92 fL (ref 80.0–100.0)
Platelets: 196 10*3/uL (ref 150–400)
RBC: 4.52 MIL/uL (ref 4.22–5.81)
RDW: 12.8 % (ref 11.5–15.5)
WBC: 10.2 10*3/uL (ref 4.0–10.5)
nRBC: 0 % (ref 0.0–0.2)

## 2021-12-24 LAB — LACTIC ACID, PLASMA
Lactic Acid, Venous: 2.1 mmol/L (ref 0.5–1.9)
Lactic Acid, Venous: 1.7 mmol/L (ref 0.5–1.9)

## 2021-12-24 LAB — RESP PANEL BY RT-PCR (FLU A&B, COVID) ARPGX2
Influenza A by PCR: NEGATIVE
Influenza B by PCR: NEGATIVE
SARS Coronavirus 2 by RT PCR: POSITIVE — AB

## 2021-12-24 LAB — PROTIME-INR
INR: 1 (ref 0.8–1.2)
Prothrombin Time: 13.3 seconds (ref 11.4–15.2)

## 2021-12-24 LAB — CBG MONITORING, ED: Glucose-Capillary: 171 mg/dL — ABNORMAL HIGH (ref 70–99)

## 2021-12-24 LAB — APTT: aPTT: 26 seconds (ref 24–36)

## 2021-12-24 MED ORDER — TRAZODONE HCL 100 MG PO TABS
100.0000 mg | ORAL_TABLET | Freq: Every day | ORAL | Status: DC
  Administered 2021-12-24 – 2021-12-26 (×3): 100 mg via ORAL
  Filled 2021-12-24 (×3): qty 1

## 2021-12-24 MED ORDER — SODIUM CHLORIDE 0.9 % IV SOLN
100.0000 mg | Freq: Every day | INTRAVENOUS | Status: DC
  Administered 2021-12-26: 100 mg via INTRAVENOUS
  Filled 2021-12-24 (×3): qty 20

## 2021-12-24 MED ORDER — ACETAMINOPHEN 325 MG PO TABS
650.0000 mg | ORAL_TABLET | Freq: Four times a day (QID) | ORAL | Status: DC | PRN
  Administered 2021-12-25 – 2021-12-26 (×3): 650 mg via ORAL
  Filled 2021-12-24 (×3): qty 2

## 2021-12-24 MED ORDER — LISINOPRIL 20 MG PO TABS
20.0000 mg | ORAL_TABLET | Freq: Every day | ORAL | Status: DC
  Administered 2021-12-25 – 2021-12-26 (×2): 20 mg via ORAL
  Filled 2021-12-24: qty 2
  Filled 2021-12-24: qty 1

## 2021-12-24 MED ORDER — ENOXAPARIN SODIUM 60 MG/0.6ML IJ SOSY
0.5000 mg/kg | PREFILLED_SYRINGE | INTRAMUSCULAR | Status: DC
  Administered 2021-12-25 – 2021-12-26 (×2): 47.5 mg via SUBCUTANEOUS
  Filled 2021-12-24 (×3): qty 0.6

## 2021-12-24 MED ORDER — ACETAMINOPHEN 500 MG PO TABS
1000.0000 mg | ORAL_TABLET | Freq: Once | ORAL | Status: AC
  Administered 2021-12-24: 1000 mg via ORAL
  Filled 2021-12-24: qty 2

## 2021-12-24 MED ORDER — ONDANSETRON HCL 4 MG/2ML IJ SOLN: 4.0000 mg | Freq: Four times a day (QID) | INTRAMUSCULAR | Status: DC | PRN

## 2021-12-24 MED ORDER — LAMOTRIGINE 100 MG PO TABS
100.0000 mg | ORAL_TABLET | Freq: Every day | ORAL | Status: DC
  Administered 2021-12-25 – 2021-12-27 (×3): 100 mg via ORAL
  Filled 2021-12-24 (×3): qty 1

## 2021-12-24 MED ORDER — ASPIRIN EC 81 MG PO TBEC
81.0000 mg | DELAYED_RELEASE_TABLET | Freq: Every day | ORAL | Status: DC
  Administered 2021-12-25 – 2021-12-27 (×3): 81 mg via ORAL
  Filled 2021-12-24 (×3): qty 1

## 2021-12-24 MED ORDER — SODIUM CHLORIDE 0.9 % IV SOLN
200.0000 mg | Freq: Once | INTRAVENOUS | Status: AC
  Administered 2021-12-25: 200 mg via INTRAVENOUS
  Filled 2021-12-24: qty 200

## 2021-12-24 MED ORDER — CLOPIDOGREL BISULFATE 75 MG PO TABS
75.0000 mg | ORAL_TABLET | Freq: Every day | ORAL | Status: DC
  Administered 2021-12-25 – 2021-12-27 (×3): 75 mg via ORAL
  Filled 2021-12-24 (×3): qty 1

## 2021-12-24 MED ORDER — HYDROCHLOROTHIAZIDE 25 MG PO TABS
25.0000 mg | ORAL_TABLET | Freq: Every morning | ORAL | Status: DC
  Administered 2021-12-25 – 2021-12-27 (×3): 25 mg via ORAL
  Filled 2021-12-24 (×2): qty 1

## 2021-12-24 MED ORDER — HYDROCOD POLST-CPM POLST ER 10-8 MG/5ML PO SUER: 5.0000 mL | Freq: Two times a day (BID) | ORAL | Status: DC | PRN

## 2021-12-24 MED ORDER — MELATONIN 5 MG PO TABS: 5.0000 mg | ORAL_TABLET | Freq: Every evening | ORAL | Status: DC | PRN

## 2021-12-24 MED ORDER — SODIUM CHLORIDE 0.9% FLUSH
3.0000 mL | Freq: Once | INTRAVENOUS | Status: AC
  Administered 2021-12-24: 3 mL via INTRAVENOUS

## 2021-12-24 MED ORDER — OLANZAPINE 10 MG PO TABS
10.0000 mg | ORAL_TABLET | Freq: Every day | ORAL | Status: DC
  Administered 2021-12-25 – 2021-12-26 (×3): 10 mg via ORAL
  Filled 2021-12-24 (×4): qty 1

## 2021-12-24 MED ORDER — METOPROLOL TARTRATE 25 MG PO TABS
25.0000 mg | ORAL_TABLET | Freq: Two times a day (BID) | ORAL | Status: DC
  Administered 2021-12-25 – 2021-12-27 (×6): 25 mg via ORAL
  Filled 2021-12-24 (×6): qty 1

## 2021-12-24 MED ORDER — AMLODIPINE BESYLATE 5 MG PO TABS
5.0000 mg | ORAL_TABLET | Freq: Every day | ORAL | Status: DC
  Administered 2021-12-25 – 2021-12-27 (×3): 5 mg via ORAL
  Filled 2021-12-24 (×3): qty 1

## 2021-12-24 MED ORDER — ONDANSETRON HCL 4 MG PO TABS: 4.0000 mg | ORAL_TABLET | Freq: Four times a day (QID) | ORAL | Status: DC | PRN

## 2021-12-24 MED ORDER — SODIUM CHLORIDE 0.9 % IV BOLUS
1000.0000 mL | Freq: Once | INTRAVENOUS | Status: AC
  Administered 2021-12-24: 1000 mL via INTRAVENOUS

## 2021-12-24 MED ORDER — GABAPENTIN 300 MG PO CAPS
300.0000 mg | ORAL_CAPSULE | Freq: Every day | ORAL | Status: DC
  Administered 2021-12-25 – 2021-12-26 (×3): 300 mg via ORAL
  Filled 2021-12-24 (×3): qty 1

## 2021-12-24 MED ORDER — GUAIFENESIN-DM 100-10 MG/5ML PO SYRP
10.0000 mL | ORAL_SOLUTION | ORAL | Status: DC | PRN
  Administered 2021-12-25: 10 mL via ORAL
  Filled 2021-12-24: qty 10

## 2021-12-24 NOTE — H&P (Addendum)
History and Physical   Jesse Black [id number] DOB: May 27, 1956 DOA: 12/24/2021  PCP: Adin Hector, MD  Outpatient Specialists: Dr. Manuella Ghazi, neurology Patient coming from: Home the EMS  I have personally briefly reviewed patient's old medical records in [place].  Chief Concern: Increased confusion  HPI: Jesse Black is a 66 y.o. male with medical history significant for CKD 3A, hypertension, neuropathy, CAD, non-insulin-dependent diabetes mellitus, insomnia, cognitive impairment, history of stroke with residual effects, dementia, who presents emergency department for chief concerns of confusion.  At bedside he is able to tell me his name, age, location and spouse at bedside. He makes unusual comments occasionally consistent with mild to moderate dementia, with compensation.  He reports he presented to ED because he had nasal congestion.   Mrs. Colleen Can states that patient woke up in the morning and told his wife, that he has a sore throat and congestion. She noticed that he became increasingly more confused throughout the day.  Of note, she reports that there daughter left for [place] on day of admission and they had lunch with her on 12/23/21 but he kept telling his wife, he was going to have lunch with his daughter on day of admission. Spouse endorsed that patient was staggering as well throughout the day. Patient had a fever when EMS arrived of 101.   Social history: He lives at home with his wife.  He denies history of tobacco, EtOH, recreational drug use.  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  Vaccination history: He is vaccinated for covid 19, with a total of three doses  ROS: Constitutional: no weight change, no fever ENT/Mouth: no sore throat, no rhinorrhea Eyes: no eye pain, no vision changes Cardiovascular: no chest pain, no dyspnea,  no edema, no palpitations Respiratory: no cough, no sputum, no  wheezing Gastrointestinal: no nausea, no vomiting, no diarrhea, no constipation Genitourinary: no urinary incontinence, no dysuria, no hematuria Musculoskeletal: no arthralgias, no myalgias Skin: no skin lesions, no pruritus, Neuro: + weakness, no loss of consciousness, no syncope Psych: no anxiety, no depression, + decrease appetite Heme/Lymph: no bruising, no bleeding  ED Course: Discussed with emergency medicine provider, patient requiring hospitalization for chief concerns of pneumonia.  Vitals in the emergency department showed T-max temperature of 101.5 and improved to 100.7, respiration rate of 22, heart rate of 98, blood pressure 132/77, SPO2 of 95% on room air.  Serum sodium was 137, potassium 4.1, chloride of 99, bicarb of 22, BUN of 21, serum creatinine of 1.51, nonfasting blood glucose 175, GFR 51.  Patient tested positive for COVID-19.  In the emergency department patient was given acetaminophen 1000 mg, trazodone 100 mg nightly, sodium chloride 1 L bolus, started on remdesivir.  Assessment/Plan  Principal Problem:   Pneumonia due to COVID-19 virus Active Problems:   Hypertension   CVA (cerebral vascular accident) ([place])   Prediabetes   Hyperlipidemia   Vitamin D deficiency   Insomnia   Fatty liver   OSA on CPAP   CKD (chronic kidney disease), stage III ([place])   # Confusion-presumed secondary to COVID-19 pneumonia - Check procalcitonin, if elevated would recommend primary team to order antibiotic, for pneumonia coverage - Continue remdesivir per EDP order - Patient is not hypoxic at this time, no clinical indications for steroids - Incentive spirometry, flutter valve - Antitussives as needed - Continue airborne and contact precautions  # Sore throat-menthol/Cepacol lozenges ordered  # Hypertension-resumed amlodipine 5  mg daily, hydrochlorothiazide 25 mg every morning, lisinopril 20 mg daily, metoprolol tartrate 25 mg p.o. twice daily  # History of  stroke-aspirin, Plavix resumed - Patient takes simvastatin at home, and does not want to take atorvastatin in the hospital - Therefore atorvastatin, as an alternative was not ordered  # Behavioral and personality changes poststroke-resumed home lamotrigine and gabapentin - Resumed home olanzapine 10 mg nightly  # Cognitive impairment-patient does not take his donepezil  # Insomnia-melatonin nightly resumed  # Insulin-dependent diabetes mellitus-metformin 1000 mg daily resumed  # CKD 3A-at baseline  Chart reviewed.   DVT prophylaxis: Enoxaparin Code Status: Full code Diet: Heart healthy/carb modified Family Communication: Updated spouse at bedside Disposition Plan: Pending clinical course Consults called: None at this time Admission status: Telemetry cardiac, observation  Past Medical History:  Diagnosis Date   High blood pressure    Hyperlipidemia    Memory loss    Prediabetes    Stroke (cerebrum) ([place])    Stroke [facility])    Past Surgical History:  Procedure Laterality Date   COLONOSCOPY WITH PROPOFOL N/A 10/05/2015   Procedure: COLONOSCOPY WITH PROPOFOL;  Surgeon: Manya Silvas, MD;  Location: [place];  Service: Endoscopy;  Laterality: N/A;   IR ANGIO INTRA EXTRACRAN SEL COM CAROTID INNOMINATE BILAT MOD SED  02/05/2019   IR ANGIO VERTEBRAL SEL SUBCLAVIAN INNOMINATE UNI R MOD SED  02/05/2019   IR ANGIO VERTEBRAL SEL VERTEBRAL UNI L MOD SED  02/05/2019   IR US GUIDE VASC ACCESS RIGHT  02/05/2019   LOOP RECORDER INSERTION N/A 05/22/2019   Procedure: LOOP RECORDER INSERTION;  Surgeon: Isaias Cowman, MD;  Location: [facility];  Service: Cardiovascular;  Laterality: N/A;   Social History:  reports that he has never smoked. He has never used smokeless tobacco. He reports current alcohol use. He reports that he does not use drugs.  No Known Allergies Family History  Problem Relation Age of Onset   Lung cancer Father    Dementia Mother    Family  history: Family history reviewed and not pertinent.  Prior to Admission medications   Medication Sig Start Date End Date Taking? Authorizing Provider  amLODipine (NORVASC) 5 MG tablet Take 1 tablet (5 mg total) by mouth daily. 07/04/19  Yes McLean-Scocuzza, Nino Glow, MD  aspirin EC 81 MG tablet Take 81 mg by mouth daily.   Yes [provider]  clopidogrel (PLAVIX) 75 MG tablet Take 1 tablet (75 mg total) by mouth daily. Further refills Dr. Manuella Ghazi 02/18/20  Yes McLean-Scocuzza, Nino Glow, MD  gabapentin (NEURONTIN) 600 MG tablet Take 600 mg by mouth at bedtime. 300mg  QHS   Yes [provider]  hydrochlorothiazide (HYDRODIURIL) 25 MG tablet Take 25 mg by mouth every morning. 03/30/20  Yes [provider]  lamoTRIgine (LAMICTAL) 100 MG tablet Take 100 mg by mouth daily.   Yes [provider]  lisinopril (ZESTRIL) 20 MG tablet Take 20 mg by mouth daily. 11/14/21  Yes [provider]  metFORMIN (GLUCOPHAGE) 1000 MG tablet Take 1,000 mg by mouth daily.   Yes [provider]  metoprolol tartrate (LOPRESSOR) 25 MG tablet Take 1 tablet (25 mg total) by mouth 2 (two) times daily. 11/05/19  Yes McLean-Scocuzza, Nino Glow, MD  molnupiravir EUA (LAGEVRIO) 200 MG CAPS capsule Take 4 capsules by mouth 2 (two) times daily. 12/24/21 12/29/21 Yes [provider]  Multiple Vitamin (MULTI-VITAMIN DAILY) TABS Take 1 tablet by mouth daily.   Yes [provider]  OLANZapine (ZYPREXA) 10 MG tablet Take 10 mg by mouth at bedtime.   Yes [provider]  Omega-3 Fatty Acids ([employer] NATURAL FISH OIL) 1200 MG CAPS Take 2,400 mg by mouth.   Yes [provider]  simvastatin (ZOCOR) 80 MG tablet Take 80 mg by mouth daily.   Yes [provider]  acetaminophen (TYLENOL) 500 MG tablet Take 500 mg by mouth daily as needed for moderate pain or headache.    [provider]  atorvastatin (LIPITOR) 40 MG tablet Take 1 tablet (40 mg total) by mouth  daily at 6 PM. Patient not taking: Reported on 12/20/2021 11/05/19   McLean-Scocuzza, Nino Glow, MD  calcium carbonate (TUMS - DOSED IN MG ELEMENTAL CALCIUM) 500 MG chewable tablet Chew 1 tablet by mouth daily as needed for indigestion or heartburn.    [provider]  donepezil (ARICEPT) 10 MG tablet Take 10 mg by mouth at bedtime. Patient not taking: Reported on 12/20/2021    Vladimir Crofts, MD  Melatonin 5 MG CAPS Take 5 mg by mouth at bedtime as needed (sleep).    [provider]  neomycin-polymyxin-hydrocortisone (CORTISPORIN) OTIC solution Place 4 drops into the left ear 4 (four) times daily. x7-10 days Patient not taking: Reported on 12/24/2021 02/28/19   McLean-Scocuzza, Nino Glow, MD  olmesartan (BENICAR) 40 MG tablet Take 40 mg by mouth every morning. Patient not taking: Reported on 12/24/2021 03/30/20   [provider]  olmesartan-hydrochlorothiazide (BENICAR HCT) 40-25 MG tablet Take 1 tablet by mouth daily. In am, if needs to take 2 separate pills of same dose ok. After this refill pt found another PCP no longer will get refills Dr. Gayland Curry Patient not taking: Reported on 04/28/2020 03/06/20   McLean-Scocuzza, Nino Glow, MD  omega-3 acid ethyl esters (LOVAZA) 1 g capsule Take 1 capsule (1 g total) by mouth daily. Patient not taking: Reported on 12/24/2021 06/11/19   McLean-Scocuzza, Nino Glow, MD  Tetrahydrozoline HCl (VISINE OP) Place 1 drop into both eyes daily as needed (dry eyes).    [provider]   Physical Exam: Vitals:   12/24/21 2320 12/24/21 2341 12/24/21 2345 12/25/21 0015  BP:   (!) 119/59 (!) 126/51  Pulse:   90 85  Resp:   (!) 22   Temp: 98.8 F (37.1 C)   98.9 F (37.2 C)  TempSrc: Oral   Oral  SpO2:   95% 93%  Weight:  96.3 kg    Height:  5\' 7"  (1.702 m)     Constitutional: appears age-appropriate, frail, NAD, calm, comfortable Eyes: PERRL, lids and conjunctivae normal ENMT: Mucous membranes are moist. Posterior pharynx clear of any exudate or  lesions. Age-appropriate dentition. Hearing appropriate Neck: normal, supple, no masses, no thyromegaly Respiratory: clear to auscultation bilaterally, no wheezing, no crackles. Normal respiratory effort. No accessory muscle use.  Cardiovascular: Regular rate and rhythm, no murmurs / rubs / gallops. No extremity edema. 2+ pedal pulses. No carotid bruits.  Abdomen: Obese abdomen, no tenderness, no masses palpated, no hepatosplenomegaly. Bowel sounds positive.  Musculoskeletal: no clubbing / cyanosis. No joint deformity upper and lower extremities. Good ROM, no contractures, no atrophy. Normal muscle tone.  Skin: no rashes, lesions, ulcers. No induration Neurologic: Sensation intact. Strength 5/5 in all 4.  Psychiatric: Normal judgment and insight. Alert and oriented x 3. Normal mood.   EKG: independently reviewed, showing sinus rhythm with rate of 107, QTc 422  Chest x-ray on Admission: I personally reviewed and I agree with  radiologist reading as below.  DG Chest Port 1 View  Result Date: 12/24/2021 CLINICAL DATA:  Sepsis EXAM: PORTABLE CHEST 1 VIEW COMPARISON:  None. FINDINGS: Lungs volumes are small, but are symmetric and are clear. No pneumothorax or pleural effusion. Cardiac size within normal limits. Implanted loop recorder noted overlying the left lung base. Pulmonary vascularity is normal. Osseous structures are age-appropriate. No acute bone abnormality. IMPRESSION: No active disease. Electronically Signed   By: Fidela Salisbury M.D.   On: 12/24/2021 19:35   CT HEAD CODE STROKE WO CONTRAST  Result Date: 12/24/2021 CLINICAL DATA:  Code stroke.  Acute neurologic deficit EXAM: CT HEAD WITHOUT CONTRAST TECHNIQUE: Contiguous axial images were obtained from the base of the skull through the vertex without intravenous contrast. RADIATION DOSE REDUCTION: This exam was performed according to the departmental dose-optimization program which includes automated exposure control, adjustment of the mA  and/or kV according to patient size and/or use of iterative reconstruction technique. COMPARISON:  None. FINDINGS: Brain: There is no mass, hemorrhage or extra-axial collection. The size and configuration of the ventricles and extra-axial CSF spaces are normal. There is hypoattenuation of the periventricular white matter, most commonly indicating chronic ischemic microangiopathy. Vascular: No abnormal hyperdensity of the major intracranial arteries or dural venous sinuses. No intracranial atherosclerosis. Skull: The visualized skull base, calvarium and extracranial soft tissues are normal. Sinuses/Orbits: No fluid levels or advanced mucosal thickening of the visualized paranasal sinuses. No mastoid or middle ear effusion. The orbits are normal. ASPECTS [facility] Stroke Program Early CT Score) - Ganglionic level infarction (caudate, lentiform nuclei, internal capsule, insula, M1-M3 cortex): 7 - Supraganglionic infarction (M4-M6 cortex): 3 Total score (0-10 with 10 being normal): 10 IMPRESSION: 1. No acute intracranial abnormality. 2. ASPECTS is 10. Dr. Theda Sers paged at 7:14 p.m. on 12/24/2021 Electronically Signed   By: Ulyses Jarred M.D.   On: 12/24/2021 19:15    Labs on Admission: I have personally reviewed following labs  CBC: Recent Labs  Lab 12/24/21 1901  WBC 10.2  NEUTROABS 8.0*  HGB 15.2  HCT 41.6  MCV 92.0  PLT 123456   Basic Metabolic Panel: Recent Labs  Lab 12/24/21 1901  NA 137  K 4.1  CL 99  CO2 28  GLUCOSE 175*  BUN 21  CREATININE 1.51*  CALCIUM 11.0*   GFR: Estimated Creatinine Clearance: 53.9 mL/min (A) (by C-G formula based on SCr of 1.51 mg/dL (H)).  Liver Function Tests: Recent Labs  Lab 12/24/21 1901  AST 38  ALT 54*  ALKPHOS 73  BILITOT 1.0  PROT 7.8  ALBUMIN 4.6   Coagulation Profile: Recent Labs  Lab 12/24/21 1901  INR 1.0   CBG: Recent Labs  Lab 12/24/21 1857  GLUCAP 171*   Urine analysis:    Component Value Date/Time   COLORURINE YELLOW (A)  01/24/2019 1331   APPEARANCEUR CLEAR (A) 01/24/2019 1331   LABSPEC 1.012 01/24/2019 1331   PHURINE 6.0 01/24/2019 1331   GLUCOSEU NEGATIVE 01/24/2019 1331   HGBUR NEGATIVE 01/24/2019 1331   BILIRUBINUR NEGATIVE 01/24/2019 1331   KETONESUR NEGATIVE 01/24/2019 1331   PROTEINUR NEGATIVE 01/24/2019 1331   NITRITE NEGATIVE 01/24/2019 1331   LEUKOCYTESUR NEGATIVE 01/24/2019 1331   Dr. Tobie Poet [facility]  If 7PM-7AM, please contact overnight-coverage provider If 7AM-7PM, please contact day coverage provider [email or web address]  12/25/2021, 2:26 AM

## 2021-12-24 NOTE — Progress Notes (Signed)
°   12/24/21 1900  Clinical Encounter Type  Visited With Family  Visit Type ED  Referral From Other (Comment) (EMS)   Chaplain visited with patients spouse and son as they arrived at the ED. They are concerned that their loved one receive and MRI as there is a history of stroke and previous concerns. Chaplain listened to their story providing them a sounding board to express concerns.

## 2021-12-24 NOTE — Sepsis Progress Note (Signed)
Monitoring for the code sepsis protocol. °

## 2021-12-24 NOTE — ED Provider Notes (Signed)
[facility] Provider Note    Event Date/Time   First MD Initiated Contact with Patient 12/24/21 1909     (approximate)  History   Chief Complaint: Code Stroke  HPI  Isaac Dubie is a 66 y.o. male with a past medical history of hypertension, hyperlipidemia, prior CVA, presents to the emergency department as a code stroke via EMS emergency traffic.  According to EMS report they state family noted the patient to be normal last at 3:30 PM since that time the patient has been confused and is having trouble speaking at times and this evening was wandering around the house.  Upon arrival patient is confused.  He is able to tell me who he is and where he is but not able to tell me the year.  Denies any weakness or numbness.  Physical Exam    General: Awake, no distress.  Oriented to person and place only but not time. CV:  Good peripheral perfusion.  Regular rate and rhythm around 100 bpm. Resp:  Normal effort.  Equal breath sounds bilaterally.  Abd:  No distention.  Soft, nontender.  No rebound or guarding. Other:  Equal grip strength bilaterally.  Moves all extremities well.  Does have some difficulty following more complex neurological commands.   ED Results / Procedures / Treatments   EKG  EKG viewed and interpreted by myself shows sinus tachycardia 107 bpm with a narrow QRS, normal axis, PR prolongation otherwise normal intervals, nonspecific but no concerning ST changes.  RADIOLOGY  I personally reviewed the CT images do not see any acute abnormality on my evaluation. Radiology has read the CT scan is negative. Chest x-ray negative.   MEDICATIONS ORDERED IN ED: Medications  traZODone (DESYREL) tablet 100 mg (100 mg Oral Given 12/24/21 2232)  aspirin EC tablet 81 mg (has no administration in time range)  amLODipine (NORVASC) tablet 5 mg (has no administration in time range)  hydrochlorothiazide (HYDRODIURIL) tablet 25 mg (has no administration  in time range)  lisinopril (ZESTRIL) tablet 20 mg (has no administration in time range)  metoprolol tartrate (LOPRESSOR) tablet 25 mg (has no administration in time range)  clopidogrel (PLAVIX) tablet 75 mg (has no administration in time range)  Melatonin CAPS 5 mg (has no administration in time range)  gabapentin (NEURONTIN) tablet 600 mg (has no administration in time range)  lamoTRIgine (LAMICTAL) tablet 100 mg (has no administration in time range)  OLANZapine (ZYPREXA) tablet 10 mg (has no administration in time range)  acetaminophen (TYLENOL) tablet 650 mg (has no administration in time range)  ondansetron (ZOFRAN) tablet 4 mg (has no administration in time range)    Or  ondansetron (ZOFRAN) injection 4 mg (has no administration in time range)  enoxaparin (LOVENOX) injection 40 mg (has no administration in time range)  guaiFENesin-dextromethorphan (ROBITUSSIN DM) 100-10 MG/5ML syrup 10 mL (has no administration in time range)  chlorpheniramine-HYDROcodone (TUSSIONEX) 10-8 MG/5ML suspension 5 mL (has no administration in time range)  sodium chloride flush (NS) 0.9 % injection 3 mL (3 mLs Intravenous Given 12/24/21 1925)  sodium chloride 0.9 % bolus 1,000 mL (0 mLs Intravenous Stopped 12/24/21 2119)  acetaminophen (TYLENOL) tablet 1,000 mg (1,000 mg Oral Given 12/24/21 1936)     IMPRESSION / MDM / ASSESSMENT AND PLAN / ED COURSE  I reviewed the triage vital signs and the nursing notes.  Patient presents to the emergency department for confusion as well as word finding difficulty/difficulty speaking.  Patient found to be febrile and  tachycardic.  Per EMS report family states the patient tested positive for COVID.  EMS initially encoded as a code stroke given the speech difficulty however as the patient is febrile with a positive home COVID test I highly suspect COVID confusion to be the cause of the patient's altered state.  Appears to be satting well on room air.  Besides confusion no  concerning findings on examination.  Neurology has seen the patient and canceled the code stroke.  We will check labs and continue to closely monitor.  CT scan is negative.  I spoke to the patient's family regarding patient's confusion and plan of care they are agreeable to plan as well.  I spoke to the neurologist regarding the patient, as above they do not believe symptoms are consistent with stroke.  Patient's work-up shows a mild lactate elevation of 2.1.  Patient has resulted COVID-positive.  Remainder of the work-up is largely nonrevealing.  White blood cell count of 10.  Chemistry does show mild creatinine elevation at 1.5, however largely unchanged from historical values.  Given the patient's COVID-positive status a believe this explains the patient's confusion and weakness.  We will start the patient on IV remdesivir and admit to the hospital service for further work-up and treatment.  I spoke to the patient's wife regarding the findings and plan to admit she is agreeable.  I spoke to the hospitalist regarding the patient and  FINAL CLINICAL IMPRESSION(S) / ED DIAGNOSES   COVID-19 Confusion/altered mental state   Note:  This document was prepared using Dragon voice recognition software and may include unintentional dictation errors.   Minna Antis, MD 12/24/21 2302

## 2021-12-24 NOTE — ED Notes (Signed)
Patient placed on hospital bed with bed alarm on. Patient is very impulsive, disoriented to time and situation. Redirectable and follows commands.

## 2021-12-24 NOTE — ED Notes (Signed)
Pt to ED via [facility], code stroke activated in the field by EMS, LKW 1630 today, pt had + home covid test earlier today, sudden onset weakness, confusion. Per EMS pt noted to be stumbling around the house, unable to name simple objects and R sided weakness noted by EMS. Per EMS also possible code sepsis. Pt with hx of dementia at baseline. Per EMS VS as follows:   118HR 101 Temp 133/51.

## 2021-12-24 NOTE — Progress Notes (Signed)
Remdesivir - Pharmacy Brief Note   O:  CXR: "No active disease" SpO2: 93-100% on RA   A/P:  Remdesivir 200 mg IVPB once followed by 100 mg IVPB daily x 4 days.   Otelia Sergeant, PharmD, [facility] 12/25/2021 7:38 AM

## 2021-12-24 NOTE — Sepsis Progress Note (Addendum)
Notified provider of need to order antibiotics.  Per Dr. Kerman Passey, patient tested covid+ today, waiting on PCR to confirm. If negative, will order antibiotics.

## 2021-12-24 NOTE — ED Notes (Signed)
Pt returned from CT with neurologist at this time. EDP and pharmacist at bedside at this time. Teleneuro nurse at bedside. This RN, primary RN Jefferey Pica, RN at bedside.

## 2021-12-24 NOTE — Consult Note (Signed)
CODE STROKE- PHARMACY COMMUNICATION   Time CODE STROKE called/page received:[id number]  Time response to CODE STROKE was made (in person or via phone):immediately  in person  Time Stroke Kit retrieved from [place] (only if needed):not needed  Name of Provider/Nurse contacted:No TNK per Dr Maryan Char @ 1910  Past Medical History:  Diagnosis Date   High blood pressure    Hyperlipidemia    Memory loss    Prediabetes    Stroke (cerebrum) ([place])    Stroke [facility])    Prior to Admission medications   Medication Sig Start Date End Date Taking? Authorizing Provider  acetaminophen (TYLENOL) 500 MG tablet Take 500 mg by mouth daily as needed for moderate pain or headache.    [provider]  amLODipine (NORVASC) 5 MG tablet Take 1 tablet (5 mg total) by mouth daily. 07/04/19   McLean-Scocuzza, Nino Glow, MD  aspirin EC 81 MG tablet Take 81 mg by mouth daily.    [provider]  atorvastatin (LIPITOR) 40 MG tablet Take 1 tablet (40 mg total) by mouth daily at 6 PM. Patient not taking: Reported on 12/20/2021 11/05/19   McLean-Scocuzza, Nino Glow, MD  calcium carbonate (TUMS - DOSED IN MG ELEMENTAL CALCIUM) 500 MG chewable tablet Chew 1 tablet by mouth daily as needed for indigestion or heartburn.    [provider]  clopidogrel (PLAVIX) 75 MG tablet Take 1 tablet (75 mg total) by mouth daily. Further refills Dr. Manuella Ghazi 02/18/20   McLean-Scocuzza, Nino Glow, MD  donepezil (ARICEPT) 10 MG tablet Take 10 mg by mouth at bedtime. Patient not taking: Reported on 12/20/2021    Vladimir Crofts, MD  gabapentin (NEURONTIN) 600 MG tablet Take 600 mg by mouth at bedtime. $RemoveBef'300mg'[id number]$  QHS    [provider]  hydrochlorothiazide (HYDRODIURIL) 25 MG tablet Take 25 mg by mouth every morning. 03/30/20   [provider]  lamoTRIgine (LAMICTAL) 100 MG tablet Take 100 mg by mouth daily.    [provider]  Melatonin 5 MG CAPS Take 5 mg by mouth at bedtime as needed (sleep).    [provider]  metFORMIN (GLUCOPHAGE) 1000 MG tablet Take 1,000 mg by mouth daily.    [provider]  metoprolol tartrate (LOPRESSOR) 25 MG tablet Take 1 tablet (25 mg total) by mouth 2 (two) times daily. 11/05/19   McLean-Scocuzza, Nino Glow, MD  Multiple Vitamin (MULTI-VITAMIN DAILY) TABS Take 1 tablet by mouth daily.    [provider]  neomycin-polymyxin-hydrocortisone (CORTISPORIN) OTIC solution Place 4 drops into the left ear 4 (four) times daily. x7-10 days Patient taking differently: Place 4 drops into the left ear 4 (four) times daily as needed (itchiness in the ear). x7-10 days 02/28/19   McLean-Scocuzza, Nino Glow, MD  OLANZapine (ZYPREXA) 10 MG tablet Take 10 mg by mouth at bedtime.    [provider]  olmesartan (BENICAR) 40 MG tablet Take 40 mg by mouth every morning. 03/30/20   [provider]  olmesartan-hydrochlorothiazide (BENICAR HCT) 40-25 MG tablet Take 1 tablet by mouth daily. In am, if needs to take 2 separate pills of same dose ok. After this refill pt found another PCP no longer will get refills Dr. Gayland Curry Patient not taking: Reported on 04/28/2020 03/06/20   McLean-Scocuzza, Nino Glow, MD  omega-3 acid ethyl esters (LOVAZA) 1 g capsule Take 1 capsule (1 g total) by mouth daily. 06/11/19   McLean-Scocuzza, Nino Glow, MD  Omega-3 Fatty Acids ([employer] NATURAL FISH OIL) 1200 MG CAPS Take  2,400 mg by mouth.    [provider]  simvastatin (ZOCOR) 80 MG tablet Take 80 mg by mouth daily.    [provider]  Tetrahydrozoline HCl (VISINE OP) Place 1 drop into both eyes daily as needed (dry eyes).    [provider]   Cristan Hout Rodriguez-Guzman PharmD, BCPS 12/24/2021 7:25 PM

## 2021-12-24 NOTE — Consult Note (Addendum)
Neurology Consult H&P  Jesse Black MR# [id number] 12/24/2021  CC: confusion and difficulty speaking.  History is obtained from: EMS and chart.  HPI: Jesse Black is a 66 y.o. male PMHx as reviewed below, prior CVA noted by family to be confused with fluctuating difficulty speaking which started about 1530 and later was wandering about the house.  EMS added that he had temperature 101F and tested positive for COVID today.  There is report of possible frontotemporal dementia.  LKW: [id number] tNK given: No low suspicion for stroke. IR Thrombectomy No, not indicated Modified Rankin Scale: 0-Completely asymptomatic and back to baseline post- stroke NIHSS:   LOC Responsiveness 0 LOC Questions 1 LOC Commands 0 Horizontal eye movement 0 Visual field 0 Facial palsy 0 Motor arm - Right arm 0 Motor arm - Left arm 0 Motor leg - Right leg 0 Motor leg - Left leg 0 Limb ataxia 0 Sensory test 0 Language 0 Speech 0 Extinction and inattention 0 NIHSS 1  ROS: Unable to assess due to encephalopathy.  Past Medical History:  Diagnosis Date   High blood pressure    Hyperlipidemia    Memory loss    Prediabetes    Stroke (cerebrum) ([place])    Stroke [facility])     Family History  Problem Relation Age of Onset   Lung cancer Father    Dementia Mother    Social History:  reports that he has never smoked. He has never used smokeless tobacco. He reports current alcohol use. He reports that he does not use drugs.   Prior to Admission medications   Medication Sig Start Date End Date Taking? Authorizing Provider  acetaminophen (TYLENOL) 500 MG tablet Take 500 mg by mouth daily as needed for moderate pain or headache.    [provider]  amLODipine (NORVASC) 5 MG tablet Take 1 tablet (5 mg total) by mouth daily. 07/04/19   McLean-Scocuzza, Nino Glow, MD  aspirin EC 81 MG tablet Take 81 mg by mouth daily.    [provider]  atorvastatin (LIPITOR) 40 MG tablet Take 1  tablet (40 mg total) by mouth daily at 6 PM. Patient not taking: Reported on 12/20/2021 11/05/19   McLean-Scocuzza, Nino Glow, MD  calcium carbonate (TUMS - DOSED IN MG ELEMENTAL CALCIUM) 500 MG chewable tablet Chew 1 tablet by mouth daily as needed for indigestion or heartburn.    [provider]  clopidogrel (PLAVIX) 75 MG tablet Take 1 tablet (75 mg total) by mouth daily. Further refills Dr. Manuella Ghazi 02/18/20   McLean-Scocuzza, Nino Glow, MD  donepezil (ARICEPT) 10 MG tablet Take 10 mg by mouth at bedtime. Patient not taking: Reported on 12/20/2021    Vladimir Crofts, MD  gabapentin (NEURONTIN) 600 MG tablet Take 600 mg by mouth at bedtime. 300mg  QHS    [provider]  hydrochlorothiazide (HYDRODIURIL) 25 MG tablet Take 25 mg by mouth every morning. 03/30/20   [provider]  lamoTRIgine (LAMICTAL) 100 MG tablet Take 100 mg by mouth daily.    [provider]  Melatonin 5 MG CAPS Take 5 mg by mouth at bedtime as needed (sleep).    [provider]  metFORMIN (GLUCOPHAGE) 1000 MG tablet Take 1,000 mg by mouth daily.    [provider]  metoprolol tartrate (LOPRESSOR) 25 MG tablet Take 1 tablet (25 mg total) by mouth 2 (two) times daily. 11/05/19   McLean-Scocuzza, Nino Glow, MD  Multiple Vitamin (MULTI-VITAMIN DAILY) TABS Take 1 tablet by mouth  daily.    [provider]  neomycin-polymyxin-hydrocortisone (CORTISPORIN) OTIC solution Place 4 drops into the left ear 4 (four) times daily. x7-10 days Patient taking differently: Place 4 drops into the left ear 4 (four) times daily as needed (itchiness in the ear). x7-10 days 02/28/19   McLean-Scocuzza, Nino Glow, MD  OLANZapine (ZYPREXA) 10 MG tablet Take 10 mg by mouth at bedtime.    [provider]  olmesartan (BENICAR) 40 MG tablet Take 40 mg by mouth every morning. 03/30/20   [provider]  olmesartan-hydrochlorothiazide (BENICAR HCT) 40-25 MG tablet Take 1 tablet by mouth daily. In am, if  needs to take 2 separate pills of same dose ok. After this refill pt found another PCP no longer will get refills Dr. Gayland Curry Patient not taking: Reported on 04/28/2020 03/06/20   McLean-Scocuzza, Nino Glow, MD  omega-3 acid ethyl esters (LOVAZA) 1 g capsule Take 1 capsule (1 g total) by mouth daily. 06/11/19   McLean-Scocuzza, Nino Glow, MD  Omega-3 Fatty Acids ([employer] NATURAL FISH OIL) 1200 MG CAPS Take 2,400 mg by mouth.    [provider]  simvastatin (ZOCOR) 80 MG tablet Take 80 mg by mouth daily.    [provider]  Tetrahydrozoline HCl (VISINE OP) Place 1 drop into both eyes daily as needed (dry eyes).    [provider]    Exam: Current vital signs: There were no vitals taken for this visit.  Physical Exam  Constitutional: Appears well-developed and well-nourished.  Psych: inappropriate laughing and affect does not seem appropriate to situation Eyes: No scleral injection HENT: No OP obstruction. Head: Normocephalic.  Cardiovascular: Normal rate and regular rhythm.  Respiratory: Effort normal, symmetric excursions bilaterally, no audible wheezing. GI: Soft.  No distension. There is no tenderness.  Skin: WDI  Neuro: Mental Status: Patient is awake, alert, oriented to person however periodically laughs for no apparent reason. Patient is not able to give a clear and coherent history. Speech is fluent, intact comprehension and repetition. No signs of aphasia or neglect. Visual Fields are full. Pupils are equal, round, and reactive to light. EOMI without ptosis or diploplia.  Facial sensation is symmetric to temperature Facial movement is symmetric.  Hearing is intact to voice. Uvula midline and palate elevates symmetrically. Shoulder shrug is symmetric. Tongue is midline without atrophy or fasciculations.  Tone is normal. Bulk is normal. 5/5 strength was present in all four extremities. Sensation is symmetric to light touch and temperature in the arms and  legs. Deep Tendon Reflexes: 2+ and symmetric in the biceps and patellae. Toes are downgoing bilaterally. FNF and HKS are intact bilaterally. Gait - Deferred  I have reviewed labs in epic and the pertinent results are: ALT 54  I have reviewed the images obtained: NCT head showed no acute intracranial abnormality, ASPECTS 10.  Assessment: Jesse Black is a 66 y.o. 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 Temperature in the field 101F and tested positive for COVID and more likely to be acute encephalopathy in the setting of cognitive impairment.   Please cancel code stroke.   Plan: - Continue home aspirin 81mg  and clopidogrel 75mg  daily. - BP goal <130/90. - Neurology will remain available, please call for questions.   This patient is critically ill and at significant risk of neurological worsening, death and care requires constant monitoring of vital signs, hemodynamics,respiratory and cardiac monitoring, neurological assessment, discussion with family, other specialists and medical decision  making of high complexity. I spent 71 minutes of neurocritical care time  in the care of  this patient. This was time spent independent of any time provided by nurse practitioner or PA.  Electronically signed by:  Lynnae Sandhoff, MD Page: [phone] 12/24/2021, 7:13 PM  If 7pm- 7am, please page neurology on call as listed in [place].

## 2021-12-25 DIAGNOSIS — N1831 Chronic kidney disease, stage 3a: Secondary | ICD-10-CM

## 2021-12-25 DIAGNOSIS — R4189 Other symptoms and signs involving cognitive functions and awareness: Secondary | ICD-10-CM | POA: Diagnosis present

## 2021-12-25 DIAGNOSIS — U071 COVID-19: Secondary | ICD-10-CM | POA: Diagnosis present

## 2021-12-25 DIAGNOSIS — J029 Acute pharyngitis, unspecified: Secondary | ICD-10-CM | POA: Diagnosis present

## 2021-12-25 DIAGNOSIS — E785 Hyperlipidemia, unspecified: Secondary | ICD-10-CM | POA: Diagnosis present

## 2021-12-25 DIAGNOSIS — R410 Disorientation, unspecified: Secondary | ICD-10-CM | POA: Diagnosis present

## 2021-12-25 DIAGNOSIS — Z8673 Personal history of transient ischemic attack (TIA), and cerebral infarction without residual deficits: Secondary | ICD-10-CM | POA: Diagnosis not present

## 2021-12-25 DIAGNOSIS — E1140 Type 2 diabetes mellitus with diabetic neuropathy, unspecified: Secondary | ICD-10-CM | POA: Diagnosis present

## 2021-12-25 DIAGNOSIS — E559 Vitamin D deficiency, unspecified: Secondary | ICD-10-CM | POA: Diagnosis present

## 2021-12-25 DIAGNOSIS — Z7982 Long term (current) use of aspirin: Secondary | ICD-10-CM | POA: Diagnosis not present

## 2021-12-25 DIAGNOSIS — G4700 Insomnia, unspecified: Secondary | ICD-10-CM | POA: Diagnosis present

## 2021-12-25 DIAGNOSIS — E1122 Type 2 diabetes mellitus with diabetic chronic kidney disease: Secondary | ICD-10-CM | POA: Diagnosis present

## 2021-12-25 DIAGNOSIS — K760 Fatty (change of) liver, not elsewhere classified: Secondary | ICD-10-CM | POA: Diagnosis present

## 2021-12-25 DIAGNOSIS — Z79899 Other long term (current) drug therapy: Secondary | ICD-10-CM | POA: Diagnosis not present

## 2021-12-25 DIAGNOSIS — G9349 Other encephalopathy: Secondary | ICD-10-CM | POA: Diagnosis present

## 2021-12-25 DIAGNOSIS — G4733 Obstructive sleep apnea (adult) (pediatric): Secondary | ICD-10-CM | POA: Diagnosis present

## 2021-12-25 DIAGNOSIS — Z7902 Long term (current) use of antithrombotics/antiplatelets: Secondary | ICD-10-CM | POA: Diagnosis not present

## 2021-12-25 DIAGNOSIS — F03A18 Unspecified dementia, mild, with other behavioral disturbance: Secondary | ICD-10-CM | POA: Diagnosis present

## 2021-12-25 DIAGNOSIS — Z794 Long term (current) use of insulin: Secondary | ICD-10-CM | POA: Diagnosis not present

## 2021-12-25 DIAGNOSIS — Z23 Encounter for immunization: Secondary | ICD-10-CM | POA: Diagnosis present

## 2021-12-25 DIAGNOSIS — Z7984 Long term (current) use of oral hypoglycemic drugs: Secondary | ICD-10-CM | POA: Diagnosis not present

## 2021-12-25 DIAGNOSIS — E669 Obesity, unspecified: Secondary | ICD-10-CM | POA: Diagnosis present

## 2021-12-25 DIAGNOSIS — I129 Hypertensive chronic kidney disease with stage 1 through stage 4 chronic kidney disease, or unspecified chronic kidney disease: Secondary | ICD-10-CM | POA: Diagnosis present

## 2021-12-25 DIAGNOSIS — I2510 Atherosclerotic heart disease of native coronary artery without angina pectoris: Secondary | ICD-10-CM | POA: Diagnosis present

## 2021-12-25 DIAGNOSIS — G9340 Encephalopathy, unspecified: Secondary | ICD-10-CM | POA: Diagnosis present

## 2021-12-25 DIAGNOSIS — N183 Chronic kidney disease, stage 3 unspecified: Secondary | ICD-10-CM

## 2021-12-25 DIAGNOSIS — Z6833 Body mass index (BMI) 33.0-33.9, adult: Secondary | ICD-10-CM | POA: Diagnosis not present

## 2021-12-25 LAB — BLOOD CULTURE ID PANEL (REFLEXED) - BCID2

## 2021-12-25 LAB — CBG MONITORING, ED
Glucose-Capillary: 189 mg/dL — ABNORMAL HIGH (ref 70–99)
Glucose-Capillary: 155 mg/dL — ABNORMAL HIGH (ref 70–99)

## 2021-12-25 LAB — CBC WITH DIFFERENTIAL/PLATELET
Abs Immature Granulocytes: 0.02 10*3/uL (ref 0.00–0.07)
Basophils Absolute: 0 10*3/uL (ref 0.0–0.1)
Basophils Relative: 0 %
Eosinophils Absolute: 0.1 10*3/uL (ref 0.0–0.5)
Eosinophils Relative: 1 %
HCT: 34.8 % — ABNORMAL LOW (ref 39.0–52.0)
Hemoglobin: 12.8 g/dL — ABNORMAL LOW (ref 13.0–17.0)
Immature Granulocytes: 0 %
Lymphocytes Relative: 20 %
Lymphs Abs: 1.5 10*3/uL (ref 0.7–4.0)
MCH: 33 pg (ref 26.0–34.0)
MCHC: 36.8 g/dL — ABNORMAL HIGH (ref 30.0–36.0)
MCV: 89.7 fL (ref 80.0–100.0)
Monocytes Absolute: 1.3 10*3/uL — ABNORMAL HIGH (ref 0.1–1.0)
Monocytes Relative: 17 %
Neutro Abs: 4.8 10*3/uL (ref 1.7–7.7)
Neutrophils Relative %: 62 %
Platelets: 157 10*3/uL (ref 150–400)
RBC: 3.88 MIL/uL — ABNORMAL LOW (ref 4.22–5.81)
RDW: 12.9 % (ref 11.5–15.5)
WBC: 7.7 10*3/uL (ref 4.0–10.5)
nRBC: 0 % (ref 0.0–0.2)

## 2021-12-25 LAB — URINALYSIS, COMPLETE (UACMP) WITH MICROSCOPIC
Bacteria, UA: NONE SEEN
Bilirubin Urine: NEGATIVE
Glucose, UA: NEGATIVE mg/dL
Hgb urine dipstick: NEGATIVE
Ketones, ur: NEGATIVE mg/dL
Leukocytes,Ua: NEGATIVE
Nitrite: NEGATIVE
Protein, ur: NEGATIVE mg/dL
Specific Gravity, Urine: 1.015 (ref 1.005–1.030)
pH: 5.5 (ref 5.0–8.0)

## 2021-12-25 LAB — GLUCOSE, CAPILLARY
Glucose-Capillary: 168 mg/dL — ABNORMAL HIGH (ref 70–99)
Glucose-Capillary: 135 mg/dL — ABNORMAL HIGH (ref 70–99)

## 2021-12-25 LAB — COMPREHENSIVE METABOLIC PANEL
ALT: 41 U/L (ref 0–44)
AST: 30 U/L (ref 15–41)
Albumin: 3.8 g/dL (ref 3.5–5.0)
Alkaline Phosphatase: 55 U/L (ref 38–126)
Anion gap: 8 (ref 5–15)
BUN: 17 mg/dL (ref 8–23)
CO2: 25 mmol/L (ref 22–32)
Calcium: 9 mg/dL (ref 8.9–10.3)
Chloride: 101 mmol/L (ref 98–111)
Creatinine, Ser: 1.25 mg/dL — ABNORMAL HIGH (ref 0.61–1.24)
GFR, Estimated: >60 mL/min (ref >60)
Glucose, Bld: 146 mg/dL — ABNORMAL HIGH (ref 70–99)
Potassium: 3.7 mmol/L (ref 3.5–5.1)
Sodium: 134 mmol/L — ABNORMAL LOW (ref 135–145)
Total Bilirubin: 0.7 mg/dL (ref 0.3–1.2)
Total Protein: 6.6 g/dL (ref 6.5–8.1)

## 2021-12-25 LAB — PROCALCITONIN: Procalcitonin: 0.11 ng/mL

## 2021-12-25 LAB — HIV ANTIBODY (ROUTINE TESTING W REFLEX): HIV Screen 4th Generation wRfx: NONREACTIVE

## 2021-12-25 MED ORDER — MENTHOL 3 MG MT LOZG
1.0000 | LOZENGE | OROMUCOSAL | Status: DC | PRN
  Filled 2021-12-25 (×2): qty 9

## 2021-12-25 MED ORDER — PNEUMOCOCCAL VAC POLYVALENT 25 MCG/0.5ML IJ INJ
0.5000 mL | INJECTION | INTRAMUSCULAR | Status: AC
  Administered 2021-12-26: 0.5 mL via INTRAMUSCULAR
  Filled 2021-12-25: qty 0.5

## 2021-12-25 MED ORDER — INSULIN ASPART 100 UNIT/ML IJ SOLN
0.0000 [IU] | Freq: Three times a day (TID) | INTRAMUSCULAR | Status: DC
  Administered 2021-12-26: 1 [IU] via SUBCUTANEOUS
  Administered 2021-12-26: 2 [IU] via SUBCUTANEOUS
  Administered 2021-12-27: 3 [IU] via SUBCUTANEOUS
  Filled 2021-12-25 (×3): qty 1

## 2021-12-25 NOTE — Progress Notes (Signed)
PHARMACIST - PHYSICIAN COMMUNICATION  CONCERNING:  Enoxaparin (Lovenox) for DVT Prophylaxis    RECOMMENDATION: Patient was prescribed enoxaprin 40mg  q24 hours for VTE prophylaxis.   Filed Weights   12/24/21 2341  Weight: 96.3 kg (212 lb 4.9 oz)    Body mass index is 33.25 kg/m.  Estimated Creatinine Clearance: 53.9 mL/min (A) (by C-G formula based on SCr of 1.51 mg/dL (H)).   Based on [facility] policy patient is candidate for enoxaparin 0.5mg /kg TBW SQ every 24 hours based on BMI being >30.  DESCRIPTION: Pharmacy has adjusted enoxaparin dose per [facility] policy.  Patient is now receiving enoxaparin 0.5 mg/kg every 24 hours   [facility], PharmD, [facility] 12/25/2021 12:01 AM

## 2021-12-25 NOTE — Evaluation (Signed)
Physical Therapy Evaluation Patient Details Name: Jesse Black MRN: [id number] DOB: 02/03/1956 Today's Date: 12/25/2021  History of Present Illness  Jesse Black is a 66 y.o. male with a past medical history of hypertension, hyperlipidemia, prior CVA, presents to the emergency department as a code stroke via EMS emergency traffic.   Clinical Impression  Pt admitted with above diagnosis. Pt received with spouse in room. Agreeable to PT. Per spouse pt has cognitive deficits at baseline but have worsened since acute hospitalization. Pt is alert and oriented x4, except did not know specific hospital (thought he was in [place]) and thought it was February but knows it is 2023. At baseline pt is indep with all mobility and ADL's. Pt able to transfer to EOB with increased time and stand with no AD with supervision, Did require assist in donning shoes prior to standing however. Pt ambulating 40' in room with shuffling gait and crouched/forward head posture. Pt able to improve B foot clearance in swing phase with min VC's and maintain but still displays limited foot clearance. Pt returned to supine in bed with supervision. Pt educated on benefits of OP PT for balance/gait to reduce risk of falls. Pt safe to d/c home with rec for referral for OP PT. Pt currently with functional limitations due to the deficits listed below (see PT Problem List). Pt will benefit from skilled PT to increase their independence and safety with mobility to allow discharge to the venue listed below.     Recommendations for follow up therapy are one component of a multi-disciplinary discharge planning process, led by the attending physician.  Recommendations may be updated based on patient status, additional functional criteria and insurance authorization.  Follow Up Recommendations Outpatient PT    Assistance Recommended at Discharge Frequent or constant Supervision/Assistance  Patient can return home with the  following  A little help with walking and/or transfers;Assist for transportation;Assistance with cooking/housework;Help with stairs or ramp for entrance    Equipment Recommendations None recommended by PT  Recommendations for Other Services       Functional Status Assessment Patient has had a recent decline in their functional status and demonstrates the ability to make significant improvements in function in a reasonable and predictable amount of time.     Precautions / Restrictions Precautions Precautions: Fall Restrictions Weight Bearing Restrictions: No Other Position/Activity Restrictions: COVID +      Mobility  Bed Mobility Overal bed mobility: Needs Assistance Bed Mobility: Supine to Sit;Sit to Supine     Supine to sit: Supervision;HOB elevated       Patient Response: Cooperative  Transfers Overall transfer level: Needs assistance Equipment used: None Transfers: Sit to/from Stand Sit to Stand: Supervision                Ambulation/Gait Ambulation/Gait assistance: Min guard Gait Distance (Feet): 40 Feet Assistive device: None Gait Pattern/deviations: Shuffle;Decreased step length - left;Decreased step length - right       General Gait Details: Initially shuffles feet. ABle to correct with min VC's.  Stairs            Wheelchair Mobility    Modified Rankin (Stroke Patients Only)       Balance Overall balance assessment: Needs assistance Sitting-balance support: Feet supported;No upper extremity supported Sitting balance-Leahy Scale: Good Sitting balance - Comments: ABle to lean anteriorly to don shoes.     Standing balance-Leahy Scale: Fair Standing balance comment: no AD required, slightly crouched in standing  Pertinent Vitals/Pain Pain Assessment: Faces Faces Pain Scale: Hurts a little bit Pain Location: R shoulder Pain Descriptors / Indicators: Sharp Pain Intervention(s): Limited  activity within patient's tolerance;Monitored during session;Repositioned    Home Living Family/patient expects to be discharged to:: Private residence Living Arrangements: Spouse/significant other Available Help at Discharge: Available 24 hours/day;Family Type of Home: House Home Access: Stairs to enter Entrance Stairs-Rails: Right Entrance Stairs-Number of Steps: 5   Home Layout: One level Home Equipment: None      Prior Function Prior Level of Function : Independent/Modified Independent                     Hand Dominance   Dominant Hand: Right    Extremity/Trunk Assessment   Upper Extremity Assessment Upper Extremity Assessment: Defer to OT evaluation    Lower Extremity Assessment Lower Extremity Assessment: Generalized weakness    Cervical / Trunk Assessment Cervical / Trunk Assessment: Normal  Communication   Communication: No difficulties  Cognition Arousal/Alertness: Awake/alert Behavior During Therapy: WFL for tasks assessed/performed Overall Cognitive Status: History of cognitive impairments - at baseline                                 General Comments: Overall appropriate with conversation, follows commands, able to answer questions accurately.        General Comments      Exercises Other Exercises Other Exercises: Role of PT in acute setting, D/c recs   Assessment/Plan    PT Assessment Patient needs continued PT services  PT Problem List Decreased strength;Decreased cognition;Decreased activity tolerance;Decreased safety awareness;Decreased balance       PT Treatment Interventions DME instruction;Balance training;Gait training;Neuromuscular re-education;Stair training;Functional mobility training;Patient/family education;Therapeutic activities;Therapeutic exercise    PT Goals (Current goals can be found in the Care Plan section)  Acute Rehab PT Goals Patient Stated Goal: to go home PT Goal Formulation: With  patient/family Time For Goal Achievement: 01/08/22 Potential to Achieve Goals: Good    Frequency Min 2X/week     Co-evaluation               AM-PAC PT "6 Clicks" Mobility  Outcome Measure Help needed turning from your back to your side while in a flat bed without using bedrails?: A Little Help needed moving from lying on your back to sitting on the side of a flat bed without using bedrails?: A Little Help needed moving to and from a bed to a chair (including a wheelchair)?: A Little Help needed standing up from a chair using your arms (e.g., wheelchair or bedside chair)?: None Help needed to walk in hospital room?: A Little Help needed climbing 3-5 steps with a railing? : A Little 6 Click Score: 19    End of Session Equipment Utilized During Treatment: Gait belt Activity Tolerance: Patient tolerated treatment well Patient left: in bed;with bed alarm set;with family/visitor present Nurse Communication: Mobility status PT Visit Diagnosis: Unsteadiness on feet (R26.81);Other abnormalities of gait and mobility (R26.89);Difficulty in walking, not elsewhere classified (R26.2)    Time: 9179-1505 PT Time Calculation (min) (ACUTE ONLY): 32 min   Charges:   PT Evaluation $PT Eval Moderate Complexity: 1 Mod PT Treatments $Gait Training: 8-22 mins       Delphia Grates. Fairly IV, PT, DPT Physical Therapist- [facility]  12/25/2021, 11:39 AM

## 2021-12-25 NOTE — Progress Notes (Signed)
PROGRESS NOTE    Jesse Black  [id number] DOB: [birth year] DOA: 12/24/2021 PCP: Adin Hector, MD    Assessment & Plan:   Principal Problem:   Pneumonia due to COVID-19 virus Active Problems:   Hypertension   CVA (cerebral vascular accident) ([place])   Prediabetes   Hyperlipidemia   Vitamin D deficiency   Insomnia   Fatty liver   OSA on CPAP   CKD (chronic kidney disease), stage III ([place])   Encephalopathy   COVID-19   COVID19 encephalopathy: no pneumonia seen on CXR. Continue on IV remdesivir, bronchodilators and encourage incentive spirometry. Continue on airborne and contact precautions  HTN: continue on home dose of amlodipine, HCTZ, lisinopril, metoprolol  Hx of CVA: continue on aspirin, plavix. Continue on home dose of lamotrigine, olanzapine for behavioral/personality changes post CVA  Cognitive impairment: continue on home dose of donepezil  Insomnia: continue on home dose of melatonin  DM2: likely poorly controlled. Started on SSI w/ accuchecks  CKDIIIa: Cr is trending down from day prior.     DVT prophylaxis: lovenox  Code Status: full  Family Communication: discussed pt's care w/ pt's family at bedside and answered their questions  Disposition Plan: likely d/c back home   Level of care: Med-Surg  Status is: Inpatient  Remains inpatient appropriate because: severity of illness    Consultants:    Procedures:    Antimicrobials:   Subjective: Pt c/o malaise   Objective: Vitals:   12/25/21 1010 12/25/21 1015 12/25/21 1100 12/25/21 1115  BP: 111/75 125/72 118/69 113/72  Pulse: 78 75 71 69  Resp: (!) 22 15 13 17   Temp:      TempSrc:      SpO2: 96% 99% 100% 98%  Weight:      Height:        Intake/Output Summary (Last 24 hours) at 12/25/2021 1307 Last data filed at 12/25/2021 0208 Gross per 24 hour  Intake 1250 ml  Output --  Net 1250 ml   Filed Weights   12/24/21 2341  Weight: 96.3 kg    Examination:  General  exam: Appears calm and comfortable  Respiratory system: Clear to auscultation. Respiratory effort normal. Cardiovascular system: S1 & S2+. No rubs, gallops or clicks. No pedal edema. Gastrointestinal system: Abdomen is nondistended, soft and nontender. Normal bowel sounds heard. Central nervous system: Alert and oriented. Moves all extremities  Psychiatry: Judgement and insight appear not at baseline. Flat mood and affect     Data Reviewed: I have personally reviewed following labs and imaging studies  CBC: Recent Labs  Lab 12/24/21 1901 12/25/21 0714  WBC 10.2 7.7  NEUTROABS 8.0* 4.8  HGB 15.2 12.8*  HCT 41.6 34.8*  MCV 92.0 89.7  PLT 196 [id number]   Basic Metabolic Panel: Recent Labs  Lab 12/24/21 1901 12/25/21 0714  NA 137 134*  K 4.1 3.7  CL 99 101  CO2 28 25  GLUCOSE 175* 146*  BUN 21 17  CREATININE 1.51* 1.25*  CALCIUM 11.0* 9.0   GFR: Estimated Creatinine Clearance: 65.2 mL/min (A) (by C-G formula based on SCr of 1.25 mg/dL (H)). Liver Function Tests: Recent Labs  Lab 12/24/21 1901 12/25/21 0714  AST 38 30  ALT 54* 41  ALKPHOS 73 55  BILITOT 1.0 0.7  PROT 7.8 6.6  ALBUMIN 4.6 3.8   No results for input(s): LIPASE, AMYLASE in the last 168 hours. No results for input(s): AMMONIA in the last 168 hours. Coagulation Profile: Recent Labs  Lab 12/24/21  1901  INR 1.0   Cardiac Enzymes: No results for input(s): CKTOTAL, CKMB, CKMBINDEX, TROPONINI in the last 168 hours. BNP (last 3 results) No results for input(s): PROBNP in the last 8760 hours. HbA1C: No results for input(s): HGBA1C in the last 72 hours. CBG: Recent Labs  Lab 12/24/21 1857 12/25/21 0808 12/25/21 1132  GLUCAP 171* 189* 155*   Lipid Profile: No results for input(s): CHOL, HDL, LDLCALC, TRIG, CHOLHDL, LDLDIRECT in the last 72 hours. Thyroid Function Tests: No results for input(s): TSH, T4TOTAL, FREET4, T3FREE, THYROIDAB in the last 72 hours. Anemia Panel: No results for input(s):  VITAMINB12, FOLATE, FERRITIN, TIBC, IRON, RETICCTPCT in the last 72 hours. Sepsis Labs: Recent Labs  Lab 12/24/21 1920 12/24/21 2151 12/25/21 0714  PROCALCITON  --   --  0.11  LATICACIDVEN 2.1* 1.7  --     Recent Results (from the past 240 hour(s))  Resp Panel by RT-PCR (Flu A&B, Covid) Nasopharyngeal Swab     Status: Abnormal   Collection Time: 12/24/21  7:16 PM   Specimen: Nasopharyngeal Swab; Nasopharyngeal(NP) swabs in vial transport medium  Result Value Ref Range Status   SARS Coronavirus 2 by RT PCR POSITIVE (A) NEGATIVE Final    Comment: (NOTE) SARS-CoV-2 target nucleic acids are DETECTED.  The SARS-CoV-2 RNA is generally detectable in upper respiratory specimens during the acute phase of infection. Positive results are indicative of the presence of the identified virus, but do not rule out bacterial infection or co-infection with other pathogens not detected by the test. Clinical correlation with patient history and other diagnostic information is necessary to determine patient infection status. The expected result is Negative.  Fact Sheet for Patients: BloggerCourse.com  Fact Sheet for Healthcare Providers: SeriousBroker.it  This test is not yet approved or cleared by the [place] FDA and  has been authorized for detection and/or diagnosis of SARS-CoV-2 by FDA under an Emergency Use Authorization (EUA).  This EUA will remain in effect (meaning this test can be used) for the duration of  the COVID-19 declaration under Section 564(b)(1) of the A ct, 21 U.S.C. section 360bbb-3(b)(1), unless the authorization is terminated or revoked sooner.     Influenza A by PCR NEGATIVE NEGATIVE Final   Influenza B by PCR NEGATIVE NEGATIVE Final    Comment: (NOTE) The Xpert Xpress SARS-CoV-2/FLU/RSV plus assay is intended as an aid in the diagnosis of influenza from Nasopharyngeal swab specimens and should not be used as a  sole basis for treatment. Nasal washings and aspirates are unacceptable for Xpert Xpress SARS-CoV-2/FLU/RSV testing.  Fact Sheet for Patients: BloggerCourse.com  Fact Sheet for Healthcare Providers: SeriousBroker.it  This test is not yet approved or cleared by the [place] FDA and has been authorized for detection and/or diagnosis of SARS-CoV-2 by FDA under an Emergency Use Authorization (EUA). This EUA will remain in effect (meaning this test can be used) for the duration of the COVID-19 declaration under Section 564(b)(1) of the Act, 21 U.S.C. section 360bbb-3(b)(1), unless the authorization is terminated or revoked.  Performed at [facility], [address] [place], [place] [address]   Blood Culture (routine x 2)     Status: None (Preliminary result)   Collection Time: 12/24/21  7:20 PM   Specimen: BLOOD  Result Value Ref Range Status   Specimen Description BLOOD RIGHT ANTECUBITAL  Final   Special Requests   Final    BOTTLES DRAWN AEROBIC AND ANAEROBIC Blood Culture adequate volume   Culture  Setup Time  Final    Organism ID to follow GRAM POSITIVE COCCI ANAEROBIC BOTTLE ONLY CRITICAL RESULT CALLED TO, READ BACK BY AND VERIFIED WITH: Performed at [facility], [address] [place], [place] [address]    Culture [facility] POSITIVE COCCI  Final   Report Status PENDING  Incomplete  Blood Culture (routine x 2)     Status: None (Preliminary result)   Collection Time: 12/24/21  8:19 PM   Specimen: BLOOD  Result Value Ref Range Status   Specimen Description BLOOD LEFT ANTECUBITAL  Final   Special Requests   Final    BOTTLES DRAWN AEROBIC AND ANAEROBIC Blood Culture adequate volume   Culture   Final    NO GROWTH < 12 HOURS Performed at [facility], [address], [place], [place] [address]    Report Status PENDING  Incomplete         Radiology Studies: DG Chest Port 1  View  Result Date: 12/24/2021 CLINICAL DATA:  Sepsis EXAM: PORTABLE CHEST 1 VIEW COMPARISON:  None. FINDINGS: Lungs volumes are small, but are symmetric and are clear. No pneumothorax or pleural effusion. Cardiac size within normal limits. Implanted loop recorder noted overlying the left lung base. Pulmonary vascularity is normal. Osseous structures are age-appropriate. No acute bone abnormality. IMPRESSION: No active disease. Electronically Signed   By: Fidela Salisbury M.D.   On: 12/24/2021 19:35   CT HEAD CODE STROKE WO CONTRAST  Result Date: 12/24/2021 CLINICAL DATA:  Code stroke.  Acute neurologic deficit EXAM: CT HEAD WITHOUT CONTRAST TECHNIQUE: Contiguous axial images were obtained from the base of the skull through the vertex without intravenous contrast. RADIATION DOSE REDUCTION: This exam was performed according to the departmental dose-optimization program which includes automated exposure control, adjustment of the mA and/or kV according to patient size and/or use of iterative reconstruction technique. COMPARISON:  None. FINDINGS: Brain: There is no mass, hemorrhage or extra-axial collection. The size and configuration of the ventricles and extra-axial CSF spaces are normal. There is hypoattenuation of the periventricular white matter, most commonly indicating chronic ischemic microangiopathy. Vascular: No abnormal hyperdensity of the major intracranial arteries or dural venous sinuses. No intracranial atherosclerosis. Skull: The visualized skull base, calvarium and extracranial soft tissues are normal. Sinuses/Orbits: No fluid levels or advanced mucosal thickening of the visualized paranasal sinuses. No mastoid or middle ear effusion. The orbits are normal. ASPECTS [facility] Stroke Program Early CT Score) - Ganglionic level infarction (caudate, lentiform nuclei, internal capsule, insula, M1-M3 cortex): 7 - Supraganglionic infarction (M4-M6 cortex): 3 Total score (0-10 with 10 being normal): 10  IMPRESSION: 1. No acute intracranial abnormality. 2. ASPECTS is 10. Dr. Theda Sers paged at 7:14 p.m. on 12/24/2021 Electronically Signed   By: Ulyses Jarred M.D.   On: 12/24/2021 19:15        Scheduled Meds:  amLODipine  5 mg Oral Daily   aspirin EC  81 mg Oral Daily   clopidogrel  75 mg Oral Daily   enoxaparin (LOVENOX) injection  0.5 mg/kg Subcutaneous Q24H   gabapentin  300 mg Oral QHS   hydrochlorothiazide  25 mg Oral q morning   insulin aspart  0-9 Units Subcutaneous TID WC   lamoTRIgine  100 mg Oral Daily   lisinopril  20 mg Oral Daily   metoprolol tartrate  25 mg Oral BID   OLANZapine  10 mg Oral QHS   traZODone  100 mg Oral QHS   Continuous Infusions:  [START ON 12/26/2021] remdesivir 100 mg in NS 100 mL  LOS: 0 days    Time spent: 31 mins     Wyvonnia Dusky, MD [facility] Pager [phone]  If 7PM-7AM, please contact night-coverage 12/25/2021, 1:07 PM

## 2021-12-25 NOTE — ED Notes (Signed)
RN and EDT at bedside. Pt dressed into clean set of clothes. Pt repositioned in bed. Pt denies any further needs at this time. Wife at bedside.

## 2021-12-25 NOTE — ED Notes (Signed)
Pt family at bedside. Pt given remote at this time.

## 2021-12-25 NOTE — Progress Notes (Signed)
PHARMACY - PHYSICIAN COMMUNICATION CRITICAL VALUE ALERT - BLOOD CULTURE IDENTIFICATION (BCID)  Jesse Black is an 66 y.o. male w/ h/o HTN, HLD, prior CVA, presents to the emergency department as a code stroke via EMS to [facility] on 12/24/2021. Pt determined to have covid encephalopathy and h/o CVA.  Assessment:  BCID update with GPCs >> Staphylococcus Epidermidis MEC-A/C in 2 of 4 vials (aero & anaero). Could be contamination. ( WBC WNL, Afeb x24h, PCT 0.11.)  Name of physician (or Provider) Contacted: Dr. Mayford Knife  Current antibiotics: No antibiotics, only receiving remdesivir for Covid treatment.  Changes to prescribed antibiotics recommended:  Will repeat cultures to assess. No antibiotics at this time.  No results found for this or any previous visit.  Martyn Malay, PharmD, [facility] Clinical Pharmacist 12/25/2021  1:15 PM

## 2021-12-25 NOTE — Plan of Care (Signed)

## 2021-12-25 NOTE — Progress Notes (Signed)
OT Cancellation Note  Patient Details Name: Jesse Black MRN: [id number] DOB: 1956-07-27   Cancelled Treatment:    Reason Eval/Treat Not Completed: Patient at procedure or test/ unavailable Pt OTF at this time. Will f/u for OT evaluation at later date/time as able. Thank you.  Rejeana Brock, MS, OTR/L ascom [phone] 12/25/21, 5:31 PM

## 2021-12-25 NOTE — Progress Notes (Signed)
°   12/25/21 0800  Clinical Encounter Type  Visited With Patient and family together  Visit Type Follow-up   Chaplain followed up on ED arrival and Code Stemi. Family encouraged and feeling much better.

## 2021-12-26 DIAGNOSIS — R4189 Other symptoms and signs involving cognitive functions and awareness: Secondary | ICD-10-CM

## 2021-12-26 LAB — CBC WITH DIFFERENTIAL/PLATELET
Abs Immature Granulocytes: 0.02 10*3/uL (ref 0.00–0.07)
Basophils Absolute: 0 10*3/uL (ref 0.0–0.1)
Basophils Relative: 1 %
Eosinophils Absolute: 0.2 10*3/uL (ref 0.0–0.5)
Eosinophils Relative: 3 %
HCT: 34.9 % — ABNORMAL LOW (ref 39.0–52.0)
Hemoglobin: 12.5 g/dL — ABNORMAL LOW (ref 13.0–17.0)
Immature Granulocytes: 0 %
Lymphocytes Relative: 30 %
Lymphs Abs: 1.7 10*3/uL (ref 0.7–4.0)
MCH: 33 pg (ref 26.0–34.0)
MCHC: 35.8 g/dL (ref 30.0–36.0)
MCV: 92.1 fL (ref 80.0–100.0)
Monocytes Absolute: 1.1 10*3/uL — ABNORMAL HIGH (ref 0.1–1.0)
Monocytes Relative: 18 %
Neutro Abs: 2.9 10*3/uL (ref 1.7–7.7)
Neutrophils Relative %: 48 %
Platelets: 149 10*3/uL — ABNORMAL LOW (ref 150–400)
RBC: 3.79 MIL/uL — ABNORMAL LOW (ref 4.22–5.81)
RDW: 13.1 % (ref 11.5–15.5)
WBC: 5.9 10*3/uL (ref 4.0–10.5)
nRBC: 0 % (ref 0.0–0.2)

## 2021-12-26 LAB — COMPREHENSIVE METABOLIC PANEL
ALT: 51 U/L — ABNORMAL HIGH (ref 0–44)
AST: 45 U/L — ABNORMAL HIGH (ref 15–41)
Albumin: 3.7 g/dL (ref 3.5–5.0)
Alkaline Phosphatase: 51 U/L (ref 38–126)
Anion gap: 9 (ref 5–15)
BUN: 20 mg/dL (ref 8–23)
CO2: 25 mmol/L (ref 22–32)
Calcium: 8.8 mg/dL — ABNORMAL LOW (ref 8.9–10.3)
Chloride: 102 mmol/L (ref 98–111)
Creatinine, Ser: 1.26 mg/dL — ABNORMAL HIGH (ref 0.61–1.24)
GFR, Estimated: >60 mL/min (ref >60)
Glucose, Bld: 149 mg/dL — ABNORMAL HIGH (ref 70–99)
Potassium: 3.7 mmol/L (ref 3.5–5.1)
Sodium: 136 mmol/L (ref 135–145)
Total Bilirubin: 0.6 mg/dL (ref 0.3–1.2)
Total Protein: 6.4 g/dL — ABNORMAL LOW (ref 6.5–8.1)

## 2021-12-26 LAB — GLUCOSE, CAPILLARY
Glucose-Capillary: 139 mg/dL — ABNORMAL HIGH (ref 70–99)
Glucose-Capillary: 191 mg/dL — ABNORMAL HIGH (ref 70–99)
Glucose-Capillary: 197 mg/dL — ABNORMAL HIGH (ref 70–99)
Glucose-Capillary: 196 mg/dL — ABNORMAL HIGH (ref 70–99)

## 2021-12-26 LAB — URINE CULTURE: Culture: <10000 — AB

## 2021-12-26 LAB — GROUP A STREP BY PCR: Group A Strep by PCR: NOT DETECTED

## 2021-12-26 MED ORDER — BENZONATATE 100 MG PO CAPS
200.0000 mg | ORAL_CAPSULE | Freq: Three times a day (TID) | ORAL | Status: DC
  Administered 2021-12-26 – 2021-12-27 (×4): 200 mg via ORAL
  Filled 2021-12-26 (×4): qty 2

## 2021-12-26 NOTE — Progress Notes (Signed)
Spoke with patient and his wife, who are awaiting preliminary repeat blood culture results to help determine disposition.

## 2021-12-26 NOTE — Evaluation (Signed)
Occupational Therapy Evaluation Patient Details Name: Jesse Black MRN: [id number] DOB: 07-Aug-1956 Today's Date: 12/26/2021   History of Present Illness Jesse Black is a 66 y.o. male with a past medical history of hypertension, hyperlipidemia, prior CVA, presents to the emergency department as a code stroke via EMS emergency traffic.   Clinical Impression   Pt seen for OT evaluation this date. Prior to admission, pt was independent in all ADLs and functional mobility, living in a 1-story home with wife. Pt's wife reported that pt has recently been experiencing R shoulder pain, which impacts his ability to participate in IADLs (e.g., laundry and household maintenance). Pt currently requires MIN GUARD for toilet transfers and functional mobility and SUPERVISION/SET-UP for standing oral care and hand hygiene due to current functional impairments (See OT Problem List below). Pt would benefit from additional skilled OT services to maximize return to PLOF and minimize risk of future falls, injury, caregiver burden, and readmission. Upon discharge, recommend outpatient OT services.   Recommendations for follow up therapy are one component of a multi-disciplinary discharge planning process, led by the attending physician.  Recommendations may be updated based on patient status, additional functional criteria and insurance authorization.   Follow Up Recommendations  Outpatient OT    Assistance Recommended at Discharge PRN  Patient can return home with the following A little help with walking and/or transfers;A little help with bathing/dressing/bathroom;Assistance with cooking/housework;Help with stairs or ramp for entrance    Functional Status Assessment  Patient has had a recent decline in their functional status and demonstrates the ability to make significant improvements in function in a reasonable and predictable amount of time.  Equipment Recommendations  None recommended by OT        Precautions / Restrictions Precautions Precautions: Fall Restrictions Weight Bearing Restrictions: No      Mobility Bed Mobility Overal bed mobility: Needs Assistance Bed Mobility: Supine to Sit;Sit to Supine     Supine to sit: Supervision Sit to supine: Supervision        Transfers Overall transfer level: Needs assistance Equipment used: None Transfers: Sit to/from Stand Sit to Stand: Min guard           General transfer comment: Requires increased time/effort + momentum to clear hips from bed      Balance Overall balance assessment: Needs assistance Sitting-balance support: Feet supported;No upper extremity supported Sitting balance-Leahy Scale: Good Sitting balance - Comments: Good balance reaching within BOS while sitting EOB   Standing balance support: No upper extremity supported;During functional activity Standing balance-Leahy Scale: Fair Standing balance comment: Requires MIN GUARD for functional mobility of short household distances without AD                           ADL either performed or assessed with clinical judgement   ADL Overall ADL's : Needs assistance/impaired                                       General ADL Comments: Requires MIN GUARD for toilet transfers and functional mobility, and requires SUPERVISION/SET-UP for standing oral care and hand hygiene     Vision Baseline Vision/History: 1 Wears glasses Ability to See in Adequate Light: 0 Adequate Patient Visual Report: No change from baseline              Pertinent Vitals/Pain Pain Assessment: Faces Faces  Pain Scale: Hurts a little bit Pain Location: R shoulder Pain Descriptors / Indicators: Sharp Pain Intervention(s): Limited activity within patient's tolerance;Monitored during session;Repositioned     Hand Dominance Right   Extremity/Trunk Assessment Upper Extremity Assessment Upper Extremity Assessment: Overall WFL for tasks assessed  (4+/5 for R shoulder flexion, 5/5 for L shoulder flexion.)   Lower Extremity Assessment Lower Extremity Assessment: Generalized weakness   Cervical / Trunk Assessment Cervical / Trunk Assessment: Normal   Communication Communication Communication: No difficulties   Cognition Arousal/Alertness: Awake/alert Behavior During Therapy: WFL for tasks assessed/performed Overall Cognitive Status: Within Functional Limits for tasks assessed                                 General Comments: Overall appropriate with conversation, follows commands, able to answer questions accurately.                Home Living Family/patient expects to be discharged to:: Private residence Living Arrangements: Spouse/significant other Available Help at Discharge: Available 24 hours/day;Family Type of Home: House Home Access: Stairs to enter [employer] of Steps: 5 Entrance Stairs-Rails: Right Home Layout: One level     Bathroom Shower/Tub: Chief Strategy Officer: Standard Bathroom Accessibility: Yes   Home Equipment: None   Additional Comments: Pt's wife states that they are remodeling bathroom to be more accessible      Prior Functioning/Environment Prior Level of Function : Independent/Modified Independent             Mobility Comments: Pt independent without AD for functional mobility ADLs Comments: Pt independent with ADLs. Endorses some difficutly with IADLs d/t shoulder pain        OT Problem List: Impaired balance (sitting and/or standing);Pain      OT Treatment/Interventions: Self-care/ADL training;Therapeutic exercise;Therapeutic activities;Patient/family education;Balance training    OT Goals(Current goals can be found in the care plan section) Acute Rehab OT Goals Patient Stated Goal: to return home OT Goal Formulation: With patient Time For Goal Achievement: 01/09/22 Potential to Achieve Goals: Good ADL Goals Pt Will Perform  Grooming: with modified independence;standing Pt Will Transfer to Toilet: with modified independence;ambulating;regular height toilet Pt Will Perform Toileting - Clothing Manipulation and hygiene: with modified independence;sitting/lateral leans  OT Frequency: Min 1X/week       AM-PAC OT "6 Clicks" Daily Activity     Outcome Measure Help from another person eating meals?: None Help from another person taking care of personal grooming?: A Little Help from another person toileting, which includes using toliet, bedpan, or urinal?: A Little Help from another person bathing (including washing, rinsing, drying)?: A Little Help from another person to put on and taking off regular upper body clothing?: None Help from another person to put on and taking off regular lower body clothing?: A Little 6 Click Score: 20   End of Session Nurse Communication: Mobility status  Activity Tolerance: Patient tolerated treatment well Patient left: in bed;with call bell/phone within reach;with family/visitor present  OT Visit Diagnosis: Unsteadiness on feet (R26.81)                Time: 1526-1600 OT Time Calculation (min): 34 min Charges:  OT General Charges $OT Visit: 1 Visit OT Evaluation $OT Eval Moderate Complexity: 1 Mod OT Treatments $Self Care/Home Management : 8-22 mins  Matthew Folks, OTR/L ASCOM [phone]

## 2021-12-26 NOTE — Discharge Summary (Addendum)
Physician Discharge Summary  Lj Sliman MVE:[id number] DOB: August 23, 1956 DOA: 12/24/2021  PCP: Lynnea Ferrier, MD  Admit date: 12/24/2021 Discharge date: 12/27/21  Admitted From: home  Disposition:  home   Recommendations for Outpatient Follow-up:  Follow up with PCP in 1-2 weeks  Home Health: no  Equipment/Devices:  Discharge Condition: stable  CODE STATUS: full  Diet recommendation: Heart Healthy / Carb Modified  Brief/Interim Summary: HPI was taken from Dr. Sedalia Muta: Jesse Black is a 66 y.o. male with medical history significant for CKD 3A, hypertension, neuropathy, CAD, non-insulin-dependent diabetes mellitus, insomnia, cognitive impairment, history of stroke with residual effects, dementia, who presents emergency department for chief concerns of confusion.   At bedside he is able to tell me his name, age, location and spouse at bedside. He makes unusual comments occasionally consistent with mild to moderate dementia, with compensation.   He reports he presented to ED because he had nasal congestion.    Jesse Black states that patient woke up in the morning and told his wife, that he has a sore throat and congestion. She noticed that he became increasingly more confused throughout the day.  Of note, she reports that there daughter left for [place] on day of admission and they had lunch with her on 12/23/21 but he kept telling his wife, he was going to have lunch with his daughter on day of admission. Spouse endorsed that patient was staggering as well throughout the day. Patient had a fever when EMS arrived of 101.    Social history: He lives at home with his wife.  He denies history of tobacco, EtOH, recreational drug use.  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   Vaccination history: He is vaccinated for covid 19, with a total of three doses   Hospital course from Dr. Mayford Knife 1/14-1/15/23: Pt was found to have COVID19  and was treated w/ IV remdesivir, bronchodilators and incentive spirometry. No pneumonia was seen on CXR. Of note, pt was encephalopathic likely secondary to COVID19 but pt also has hx of cognitive impairment. Pt's mental status returned to baseline prior to d/c. Of note, blood cxs were positive for stap epi, which was likely a containment. Repeat blood cxs were NGTD at time of d/c.  Discharge Diagnoses:  Principal Problem:   Pneumonia due to COVID-19 virus Active Problems:   Hypertension   CVA (cerebral vascular accident) (HCC)   Prediabetes   Hyperlipidemia   Vitamin D deficiency   Insomnia   Fatty liver   OSA on CPAP   CKD (chronic kidney disease), stage III (HCC)   Encephalopathy   COVID-19   COVID19 encephalopathy: no pneumonia seen on CXR. Continue on IV remdesivir, bronchodilators and encourage incentive spirometry. Continue on airborne and contact precautions  HTN: continue on home dose of amlodipine, HCTZ, lisinopril, metoprolol  Hx of CVA: continue on aspirin, plavix. Continue on home dose of lamotrigine, olanzapine for behavioral/personality changes post CVA  Cognitive impairment: continue on home dose of donepezil  Unlikely bacteremia: blood cxs grew stap epi, which is likely a containment. Repeat blood cxs are pending.   Insomnia: continue on home dose of melatonin  DM2: likely poorly controlled. Started on SSI w/ accuchecks  CKDIIIa: Cr is labile   Discharge Instructions  Discharge Instructions     Diet - low sodium heart healthy   Complete by: As directed    Diet Carb Modified   Complete by: As directed  Discharge instructions   Complete by: As directed    F/u w/ PCP in 1-2 weeks   Increase activity slowly   Complete by: As directed       Allergies as of 12/27/2021   No Known Allergies      Medication List     STOP taking these medications    atorvastatin 40 MG tablet Commonly known as: LIPITOR   donepezil 10 MG tablet Commonly known  as: ARICEPT   neomycin-polymyxin-hydrocortisone OTIC solution Commonly known as: CORTISPORIN   olmesartan 40 MG tablet Commonly known as: BENICAR   olmesartan-hydrochlorothiazide 40-25 MG tablet Commonly known as: Benicar HCT   omega-3 acid ethyl esters 1 g capsule Commonly known as: LOVAZA       TAKE these medications    acetaminophen 500 MG tablet Commonly known as: TYLENOL Take 500 mg by mouth daily as needed for moderate pain or headache.   amLODipine 5 MG tablet Commonly known as: NORVASC Take 1 tablet (5 mg total) by mouth daily.   aspirin EC 81 MG tablet Take 81 mg by mouth daily.   calcium carbonate 500 MG chewable tablet Commonly known as: TUMS - dosed in mg elemental calcium Chew 1 tablet by mouth daily as needed for indigestion or heartburn.   clopidogrel 75 MG tablet Commonly known as: PLAVIX Take 1 tablet (75 mg total) by mouth daily. Further refills Dr. Manuella Ghazi   [employer] Natural Fish Oil 1200 MG Caps Take 2,400 mg by mouth.   gabapentin 600 MG tablet Commonly known as: NEURONTIN Take 600 mg by mouth at bedtime. 300mg  QHS   hydrochlorothiazide 25 MG tablet Commonly known as: HYDRODIURIL Take 25 mg by mouth every morning.   lamoTRIgine 100 MG tablet Commonly known as: LAMICTAL Take 100 mg by mouth daily.   lisinopril 20 MG tablet Commonly known as: ZESTRIL Take 20 mg by mouth daily.   Melatonin 5 MG Caps Take 5 mg by mouth at bedtime as needed (sleep).   metFORMIN 1000 MG tablet Commonly known as: GLUCOPHAGE Take 1,000 mg by mouth daily.   metoprolol tartrate 25 MG tablet Commonly known as: LOPRESSOR Take 1 tablet (25 mg total) by mouth 2 (two) times daily.   molnupiravir EUA 200 MG Caps capsule Commonly known as: LAGEVRIO Take 4 capsules by mouth 2 (two) times daily.   Multi-Vitamin Daily Tabs Take 1 tablet by mouth daily.   OLANZapine 10 MG tablet Commonly known as: ZYPREXA Take 10 mg by mouth at bedtime.   simvastatin 80 MG  tablet Commonly known as: ZOCOR Take 80 mg by mouth daily.   VISINE OP Place 1 drop into both eyes daily as needed (dry eyes).        No Known Allergies  Consultations:    Procedures/Studies: DG Chest Port 1 View  Result Date: 12/24/2021 CLINICAL DATA:  Sepsis EXAM: PORTABLE CHEST 1 VIEW COMPARISON:  None. FINDINGS: Lungs volumes are small, but are symmetric and are clear. No pneumothorax or pleural effusion. Cardiac size within normal limits. Implanted loop recorder noted overlying the left lung base. Pulmonary vascularity is normal. Osseous structures are age-appropriate. No acute bone abnormality. IMPRESSION: No active disease. Electronically Signed   By: Fidela Salisbury M.D.   On: 12/24/2021 19:35   CT HEAD CODE STROKE WO CONTRAST  Result Date: 12/24/2021 CLINICAL DATA:  Code stroke.  Acute neurologic deficit EXAM: CT HEAD WITHOUT CONTRAST TECHNIQUE: Contiguous axial images were obtained from the base of the skull through the vertex without intravenous contrast.  RADIATION DOSE REDUCTION: This exam was performed according to the departmental dose-optimization program which includes automated exposure control, adjustment of the mA and/or kV according to patient size and/or use of iterative reconstruction technique. COMPARISON:  None. FINDINGS: Brain: There is no mass, hemorrhage or extra-axial collection. The size and configuration of the ventricles and extra-axial CSF spaces are normal. There is hypoattenuation of the periventricular white matter, most commonly indicating chronic ischemic microangiopathy. Vascular: No abnormal hyperdensity of the major intracranial arteries or dural venous sinuses. No intracranial atherosclerosis. Skull: The visualized skull base, calvarium and extracranial soft tissues are normal. Sinuses/Orbits: No fluid levels or advanced mucosal thickening of the visualized paranasal sinuses. No mastoid or middle ear effusion. The orbits are normal. ASPECTS [facility]  Stroke Program Early CT Score) - Ganglionic level infarction (caudate, lentiform nuclei, internal capsule, insula, M1-M3 cortex): 7 - Supraganglionic infarction (M4-M6 cortex): 3 Total score (0-10 with 10 being normal): 10 IMPRESSION: 1. No acute intracranial abnormality. 2. ASPECTS is 10. Dr. Theda Sers paged at 7:14 p.m. on 12/24/2021 Electronically Signed   By: Ulyses Jarred M.D.   On: 12/24/2021 19:15   (Echo, Carotid, EGD, Colonoscopy, ERCP)    Subjective: Pt c/o sore throat   Discharge Exam: Vitals:   12/27/21 0633 12/27/21 0909  BP: 110/74 119/68  Pulse: 63 69  Resp: 18 18  Temp: 98.3 F (36.8 C) 98 F (36.7 C)  SpO2: 99% 94%   Vitals:   12/26/21 1538 12/26/21 1954 12/27/21 0633 12/27/21 0909  BP: 100/69 (!) 143/123 110/74 119/68  Pulse: 79 82 63 69  Resp: 17 17 18 18   Temp: 98.4 F (36.9 C) 99.4 F (37.4 C) 98.3 F (36.8 C) 98 F (36.7 C)  TempSrc:    Oral  SpO2: 93% 93% 99% 94%  Weight:      Height:        General: Pt is alert, awake, not in acute distress Cardiovascular: S1/S2 +, no rubs, no gallops Respiratory: CTA bilaterally, no wheezing, no rhonchi Abdominal: Soft, NT, ND, bowel sounds + Extremities: no edema, no cyanosis    The results of significant diagnostics from this hospitalization (including imaging, microbiology, ancillary and laboratory) are listed below for reference.     Microbiology: Recent Results (from the past 240 hour(s))  Resp Panel by RT-PCR (Flu A&B, Covid) Nasopharyngeal Swab     Status: Abnormal   Collection Time: 12/24/21  7:16 PM   Specimen: Nasopharyngeal Swab; Nasopharyngeal(NP) swabs in vial transport medium  Result Value Ref Range Status   SARS Coronavirus 2 by RT PCR POSITIVE (A) NEGATIVE Final    Comment: (NOTE) SARS-CoV-2 target nucleic acids are DETECTED.  The SARS-CoV-2 RNA is generally detectable in upper respiratory specimens during the acute phase of infection. Positive results are indicative of the presence of  the identified virus, but do not rule out bacterial infection or co-infection with other pathogens not detected by the test. Clinical correlation with patient history and other diagnostic information is necessary to determine patient infection status. The expected result is Negative.  Fact Sheet for Patients: EntrepreneurPulse.com.au  Fact Sheet for Healthcare Providers: IncredibleEmployment.be  This test is not yet approved or cleared by the [place] FDA and  has been authorized for detection and/or diagnosis of SARS-CoV-2 by FDA under an Emergency Use Authorization (EUA).  This EUA will remain in effect (meaning this test can be used) for the duration of  the COVID-19 declaration under Section 564(b)(1) of the A ct, 21 U.S.C. section 360bbb-3(b)(1), unless  the authorization is terminated or revoked sooner.     Influenza A by PCR NEGATIVE NEGATIVE Final   Influenza B by PCR NEGATIVE NEGATIVE Final    Comment: (NOTE) The Xpert Xpress SARS-CoV-2/FLU/RSV plus assay is intended as an aid in the diagnosis of influenza from Nasopharyngeal swab specimens and should not be used as a sole basis for treatment. Nasal washings and aspirates are unacceptable for Xpert Xpress SARS-CoV-2/FLU/RSV testing.  Fact Sheet for Patients: EntrepreneurPulse.com.au  Fact Sheet for Healthcare Providers: IncredibleEmployment.be  This test is not yet approved or cleared by the [place] FDA and has been authorized for detection and/or diagnosis of SARS-CoV-2 by FDA under an Emergency Use Authorization (EUA). This EUA will remain in effect (meaning this test can be used) for the duration of the COVID-19 declaration under Section 564(b)(1) of the Act, 21 U.S.C. section 360bbb-3(b)(1), unless the authorization is terminated or revoked.  Performed at [facility], [address], [place], [place] [address]   Blood  Culture (routine x 2)     Status: None (Preliminary result)   Collection Time: 12/24/21  7:20 PM   Specimen: BLOOD  Result Value Ref Range Status   Specimen Description   Final    BLOOD RIGHT ANTECUBITAL Performed at [facility], [address] [place], [place] [address]    Special Requests   Final    BOTTLES DRAWN AEROBIC AND ANAEROBIC Blood Culture adequate volume Performed at [facility], [address] [place], [place] [address]    Culture  Setup Time   Final    Organism ID to follow GRAM POSITIVE COCCI IN BOTH AEROBIC AND ANAEROBIC BOTTLES CRITICAL RESULT CALLED TO, READ BACK BY AND VERIFIED WITH: BRANDON BEERS AT [id number] 12/25/21.PMF Performed at [facility], [address], [place], [place] [address]    Culture   Final    Lonell Grandchild POSITIVE COCCI TOO YOUNG TO READ Performed at [facility], [place] [address], [place], [place] [address]    Report Status PENDING  Incomplete  Blood Culture ID Panel (Reflexed)     Status: Abnormal   Collection Time: 12/24/21  7:20 PM  Result Value Ref Range Status   Enterococcus faecalis NOT DETECTED NOT DETECTED Final   Enterococcus Faecium NOT DETECTED NOT DETECTED Final   Listeria monocytogenes NOT DETECTED NOT DETECTED Final   Staphylococcus species DETECTED (A) NOT DETECTED Final    Comment: CRITICAL RESULT CALLED TO, READ BACK BY AND VERIFIED WITH: BRANDON BEERS AT 1314 12/25/21.PMF    Staphylococcus aureus (BCID) NOT DETECTED NOT DETECTED Final   Staphylococcus epidermidis DETECTED (A) NOT DETECTED Final    Comment: Methicillin (oxacillin) resistant coagulase negative staphylococcus. Possible blood culture contaminant (unless isolated from more than one blood culture draw or clinical case suggests pathogenicity). No antibiotic treatment is indicated for blood  culture contaminants. CRITICAL RESULT CALLED TO, READ BACK BY AND VERIFIED WITH: BRANDON BEERS AT [id number] 12/25/21.PMF    Staphylococcus lugdunensis  NOT DETECTED NOT DETECTED Final   Streptococcus species NOT DETECTED NOT DETECTED Final   Streptococcus agalactiae NOT DETECTED NOT DETECTED Final   Streptococcus pneumoniae NOT DETECTED NOT DETECTED Final   Streptococcus pyogenes NOT DETECTED NOT DETECTED Final   A.calcoaceticus-baumannii NOT DETECTED NOT DETECTED Final   Bacteroides fragilis NOT DETECTED NOT DETECTED Final   Enterobacterales NOT DETECTED NOT DETECTED Final   Enterobacter cloacae complex NOT DETECTED NOT DETECTED Final   Escherichia coli NOT DETECTED NOT DETECTED Final   Klebsiella aerogenes NOT  DETECTED NOT DETECTED Final   Klebsiella oxytoca NOT DETECTED NOT DETECTED Final   Klebsiella pneumoniae NOT DETECTED NOT DETECTED Final   Proteus species NOT DETECTED NOT DETECTED Final   Salmonella species NOT DETECTED NOT DETECTED Final   Serratia marcescens NOT DETECTED NOT DETECTED Final   Haemophilus influenzae NOT DETECTED NOT DETECTED Final   Neisseria meningitidis NOT DETECTED NOT DETECTED Final   Pseudomonas aeruginosa NOT DETECTED NOT DETECTED Final   Stenotrophomonas maltophilia NOT DETECTED NOT DETECTED Final   Candida albicans NOT DETECTED NOT DETECTED Final   Candida auris NOT DETECTED NOT DETECTED Final   Candida glabrata NOT DETECTED NOT DETECTED Final   Candida krusei NOT DETECTED NOT DETECTED Final   Candida parapsilosis NOT DETECTED NOT DETECTED Final   Candida tropicalis NOT DETECTED NOT DETECTED Final   Cryptococcus neoformans/gattii NOT DETECTED NOT DETECTED Final   Methicillin resistance mecA/C DETECTED (A) NOT DETECTED Final    Comment: CRITICAL RESULT CALLED TO, READ BACK BY AND VERIFIED WITH: BRANDON BEERS AT 1314 12/25/21.PMF Performed at [facility], [address] [place], [place] [address]   Blood Culture (routine x 2)     Status: None (Preliminary result)   Collection Time: 12/24/21  8:19 PM   Specimen: BLOOD  Result Value Ref Range Status   Specimen Description BLOOD LEFT  ANTECUBITAL  Final   Special Requests   Final    BOTTLES DRAWN AEROBIC AND ANAEROBIC Blood Culture adequate volume   Culture   Final    NO GROWTH 3 DAYS Performed at [facility], [address], [place], [place] [address]    Report Status PENDING  Incomplete  Urine Culture     Status: Abnormal   Collection Time: 12/25/21  2:09 AM   Specimen: Urine, Random  Result Value Ref Range Status   Specimen Description   Final    URINE, RANDOM Performed at [facility], [address], [place], [place] [address]    Special Requests   Final    NONE Performed at [facility], [address] [place], [place] [address]    Culture (A)  Final    <10,000 COLONIES/mL INSIGNIFICANT GROWTH Performed at [facility], [place] [address], [place], [place] [address]    Report Status 12/26/2021 FINAL  Final  CULTURE, BLOOD (ROUTINE X 2) w Reflex to ID Panel     Status: None (Preliminary result)   Collection Time: 12/26/21  8:41 AM   Specimen: BLOOD RIGHT HAND  Result Value Ref Range Status   Specimen Description BLOOD RIGHT HAND  Final   Special Requests   Final    BOTTLES DRAWN AEROBIC AND ANAEROBIC Blood Culture results may not be optimal due to an excessive volume of blood received in culture bottles   Culture   Final    NO GROWTH < 24 HOURS Performed at [facility], [address], [place], [place] [address]    Report Status PENDING  Incomplete  CULTURE, BLOOD (ROUTINE X 2) w Reflex to ID Panel     Status: None (Preliminary result)   Collection Time: 12/26/21  8:41 AM   Specimen: BLOOD  Result Value Ref Range Status   Specimen Description BLOOD BLOOD RIGHT FOREARM  Final   Special Requests   Final    BOTTLES DRAWN AEROBIC ONLY Blood Culture adequate volume   Culture   Final    NO GROWTH < 24 HOURS Performed at [facility], [place],  [place] [address]    Report Status PENDING  Incomplete  Group A Strep by PCR     Status:  None   Collection Time: 12/26/21 10:44 AM   Specimen: Throat; Sterile Swab  Result Value Ref Range Status   Group A Strep by PCR NOT DETECTED NOT DETECTED Final    Comment: Performed at [facility], [address] [place], [place] [address]     Labs: BNP (last 3 results) No results for input(s): BNP in the last 8760 hours. Basic Metabolic Panel: Recent Labs  Lab 12/24/21 1901 12/25/21 0714 12/26/21 0510 12/27/21 0439  NA 137 134* 136 137  K 4.1 3.7 3.7 3.8  CL 99 101 102 104  CO2 28 25 25 27   GLUCOSE 175* 146* 149* 172*  BUN 21 17 20 23   CREATININE 1.51* 1.25* 1.26* 1.57*  CALCIUM 11.0* 9.0 8.8* 8.7*   Liver Function Tests: Recent Labs  Lab 12/24/21 1901 12/25/21 0714 12/26/21 0510 12/27/21 0439  AST 38 30 45* 35  ALT 54* 41 51* 46*  ALKPHOS 73 55 51 50  BILITOT 1.0 0.7 0.6 0.7  PROT 7.8 6.6 6.4* 6.0*  ALBUMIN 4.6 3.8 3.7 3.5   No results for input(s): LIPASE, AMYLASE in the last 168 hours. No results for input(s): AMMONIA in the last 168 hours. CBC: Recent Labs  Lab 12/24/21 1901 12/25/21 0714 12/26/21 0510 12/27/21 0439  WBC 10.2 7.7 5.9 5.9  NEUTROABS 8.0* 4.8 2.9 2.6  HGB 15.2 12.8* 12.5* 12.0*  HCT 41.6 34.8* 34.9* 33.2*  MCV 92.0 89.7 92.1 91.7  PLT 196 157 149* 161   Cardiac Enzymes: No results for input(s): CKTOTAL, CKMB, CKMBINDEX, TROPONINI in the last 168 hours. BNP: Invalid input(s): POCBNP CBG: Recent Labs  Lab 12/26/21 0734 12/26/21 1233 12/26/21 1715 12/26/21 2141 12/27/21 0905  GLUCAP 139* 191* 197* 196* 134*   D-Dimer No results for input(s): DDIMER in the last 72 hours. Hgb A1c No results for input(s): HGBA1C in the last 72 hours. Lipid Profile No results for input(s): CHOL, HDL, LDLCALC, TRIG, CHOLHDL, LDLDIRECT in the last 72 hours. Thyroid function studies No results for input(s): TSH, T4TOTAL, T3FREE, THYROIDAB in the last 72 hours.  Invalid input(s): FREET3 Anemia work up No results for input(s):  VITAMINB12, FOLATE, FERRITIN, TIBC, IRON, RETICCTPCT in the last 72 hours. Urinalysis    Component Value Date/Time   COLORURINE YELLOW 12/25/2021 0209   APPEARANCEUR CLEAR 12/25/2021 0209   LABSPEC 1.015 12/25/2021 0209   PHURINE 5.5 12/25/2021 0209   GLUCOSEU NEGATIVE 12/25/2021 0209   HGBUR NEGATIVE 12/25/2021 0209   BILIRUBINUR NEGATIVE 12/25/2021 0209   KETONESUR NEGATIVE 12/25/2021 0209   PROTEINUR NEGATIVE 12/25/2021 0209   NITRITE NEGATIVE 12/25/2021 0209   LEUKOCYTESUR NEGATIVE 12/25/2021 0209   Sepsis Labs Invalid input(s): PROCALCITONIN,  WBC,  LACTICIDVEN Microbiology Recent Results (from the past 240 hour(s))  Resp Panel by RT-PCR (Flu A&B, Covid) Nasopharyngeal Swab     Status: Abnormal   Collection Time: 12/24/21  7:16 PM   Specimen: Nasopharyngeal Swab; Nasopharyngeal(NP) swabs in vial transport medium  Result Value Ref Range Status   SARS Coronavirus 2 by RT PCR POSITIVE (A) NEGATIVE Final    Comment: (NOTE) SARS-CoV-2 target nucleic acids are DETECTED.  The SARS-CoV-2 RNA is generally detectable in upper respiratory specimens during the acute phase of infection. Positive results are indicative of the presence of the identified virus, but do not rule out bacterial infection or co-infection with other pathogens not detected by  the test. Clinical correlation with patient history and other diagnostic information is necessary to determine patient infection status. The expected result is Negative.  Fact Sheet for Patients: EntrepreneurPulse.com.au  Fact Sheet for Healthcare Providers: IncredibleEmployment.be  This test is not yet approved or cleared by the [place] FDA and  has been authorized for detection and/or diagnosis of SARS-CoV-2 by FDA under an Emergency Use Authorization (EUA).  This EUA will remain in effect (meaning this test can be used) for the duration of  the COVID-19 declaration under Section 564(b)(1)  of the A ct, 21 U.S.C. section 360bbb-3(b)(1), unless the authorization is terminated or revoked sooner.     Influenza A by PCR NEGATIVE NEGATIVE Final   Influenza B by PCR NEGATIVE NEGATIVE Final    Comment: (NOTE) The Xpert Xpress SARS-CoV-2/FLU/RSV plus assay is intended as an aid in the diagnosis of influenza from Nasopharyngeal swab specimens and should not be used as a sole basis for treatment. Nasal washings and aspirates are unacceptable for Xpert Xpress SARS-CoV-2/FLU/RSV testing.  Fact Sheet for Patients: EntrepreneurPulse.com.au  Fact Sheet for Healthcare Providers: IncredibleEmployment.be  This test is not yet approved or cleared by the [place] FDA and has been authorized for detection and/or diagnosis of SARS-CoV-2 by FDA under an Emergency Use Authorization (EUA). This EUA will remain in effect (meaning this test can be used) for the duration of the COVID-19 declaration under Section 564(b)(1) of the Act, 21 U.S.C. section 360bbb-3(b)(1), unless the authorization is terminated or revoked.  Performed at [facility], [address], [place], [place] [address]   Blood Culture (routine x 2)     Status: None (Preliminary result)   Collection Time: 12/24/21  7:20 PM   Specimen: BLOOD  Result Value Ref Range Status   Specimen Description   Final    BLOOD RIGHT ANTECUBITAL Performed at [facility], [address] [place], [place] [address]    Special Requests   Final    BOTTLES DRAWN AEROBIC AND ANAEROBIC Blood Culture adequate volume Performed at [facility], [address] [place], [place] [address]    Culture  Setup Time   Final    Organism ID to follow GRAM POSITIVE COCCI IN BOTH AEROBIC AND ANAEROBIC BOTTLES CRITICAL RESULT CALLED TO, READ BACK BY AND VERIFIED WITH: BRANDON BEERS AT [id number] 12/25/21.PMF Performed at [facility], [address] [place], [place] [address]     Culture   Final    Lonell Grandchild POSITIVE COCCI TOO YOUNG TO READ Performed at [facility], [place] [address] [place],  [address]    Report Status PENDING  Incomplete  Blood Culture ID Panel (Reflexed)     Status: Abnormal   Collection Time: 12/24/21  7:20 PM  Result Value Ref Range Status   Enterococcus faecalis NOT DETECTED NOT DETECTED Final   Enterococcus Faecium NOT DETECTED NOT DETECTED Final   Listeria monocytogenes NOT DETECTED NOT DETECTED Final   Staphylococcus species DETECTED (A) NOT DETECTED Final    Comment: CRITICAL RESULT CALLED TO, READ BACK BY AND VERIFIED WITH: BRANDON BEERS AT 1314 12/25/21.PMF    Staphylococcus aureus (BCID) NOT DETECTED NOT DETECTED Final   Staphylococcus epidermidis DETECTED (A) NOT DETECTED Final    Comment: Methicillin (oxacillin) resistant coagulase negative staphylococcus. Possible blood culture contaminant (unless isolated from more than one blood culture draw or clinical case suggests pathogenicity). No antibiotic treatment is indicated for blood  culture contaminants. CRITICAL RESULT CALLED TO, READ BACK BY AND  VERIFIED WITH: BRANDON BEERS AT [id number] 12/25/21.PMF    Staphylococcus lugdunensis NOT DETECTED NOT DETECTED Final   Streptococcus species NOT DETECTED NOT DETECTED Final   Streptococcus agalactiae NOT DETECTED NOT DETECTED Final   Streptococcus pneumoniae NOT DETECTED NOT DETECTED Final   Streptococcus pyogenes NOT DETECTED NOT DETECTED Final   A.calcoaceticus-baumannii NOT DETECTED NOT DETECTED Final   Bacteroides fragilis NOT DETECTED NOT DETECTED Final   Enterobacterales NOT DETECTED NOT DETECTED Final   Enterobacter cloacae complex NOT DETECTED NOT DETECTED Final   Escherichia coli NOT DETECTED NOT DETECTED Final   Klebsiella aerogenes NOT DETECTED NOT DETECTED Final   Klebsiella oxytoca NOT DETECTED NOT DETECTED Final   Klebsiella pneumoniae NOT DETECTED NOT DETECTED Final   Proteus species NOT DETECTED NOT DETECTED Final    Salmonella species NOT DETECTED NOT DETECTED Final   Serratia marcescens NOT DETECTED NOT DETECTED Final   Haemophilus influenzae NOT DETECTED NOT DETECTED Final   Neisseria meningitidis NOT DETECTED NOT DETECTED Final   Pseudomonas aeruginosa NOT DETECTED NOT DETECTED Final   Stenotrophomonas maltophilia NOT DETECTED NOT DETECTED Final   Candida albicans NOT DETECTED NOT DETECTED Final   Candida auris NOT DETECTED NOT DETECTED Final   Candida glabrata NOT DETECTED NOT DETECTED Final   Candida krusei NOT DETECTED NOT DETECTED Final   Candida parapsilosis NOT DETECTED NOT DETECTED Final   Candida tropicalis NOT DETECTED NOT DETECTED Final   Cryptococcus neoformans/gattii NOT DETECTED NOT DETECTED Final   Methicillin resistance mecA/C DETECTED (A) NOT DETECTED Final    Comment: CRITICAL RESULT CALLED TO, READ BACK BY AND VERIFIED WITH: BRANDON BEERS AT 1314 12/25/21.PMF Performed at [facility], [address] [place], [place] [address]   Blood Culture (routine x 2)     Status: None (Preliminary result)   Collection Time: 12/24/21  8:19 PM   Specimen: BLOOD  Result Value Ref Range Status   Specimen Description BLOOD LEFT ANTECUBITAL  Final   Special Requests   Final    BOTTLES DRAWN AEROBIC AND ANAEROBIC Blood Culture adequate volume   Culture   Final    NO GROWTH 3 DAYS Performed at [facility], [address] [place], [place] [address]    Report Status PENDING  Incomplete  Urine Culture     Status: Abnormal   Collection Time: 12/25/21  2:09 AM   Specimen: Urine, Random  Result Value Ref Range Status   Specimen Description   Final    URINE, RANDOM Performed at [facility], [address], [place], [place] [address]    Special Requests   Final    NONE Performed at [facility], [address], [place], [place] [address]    Culture (A)  Final    <10,000 COLONIES/mL INSIGNIFICANT GROWTH Performed at [facility], [place] [address] [place], [place] [address]    Report Status 12/26/2021 FINAL  Final  CULTURE, BLOOD (ROUTINE X 2) w Reflex to ID Panel     Status: None (Preliminary result)   Collection Time: 12/26/21  8:41 AM   Specimen: BLOOD RIGHT HAND  Result Value Ref Range Status   Specimen Description BLOOD RIGHT HAND  Final   Special Requests   Final    BOTTLES DRAWN AEROBIC AND ANAEROBIC Blood Culture results may not be optimal due to an excessive volume of blood received in culture bottles   Culture   Final    NO GROWTH < 24 HOURS Performed at [facility], [address] [place], [place] [address]    Report Status PENDING  Incomplete  CULTURE, BLOOD (ROUTINE X 2) w Reflex to ID Panel     Status: None (Preliminary result)   Collection Time: 12/26/21  8:41 AM   Specimen: BLOOD  Result Value Ref Range Status   Specimen Description BLOOD BLOOD RIGHT FOREARM  Final   Special Requests   Final    BOTTLES DRAWN AEROBIC ONLY Blood Culture adequate volume   Culture   Final    NO GROWTH < 24 HOURS Performed at [facility], [address] [place], [place] [address]    Report Status PENDING  Incomplete  Group A Strep by PCR     Status: None   Collection Time: 12/26/21 10:44 AM   Specimen: Throat; Sterile Swab  Result Value Ref Range Status   Group A Strep by PCR NOT DETECTED NOT DETECTED Final    Comment: Performed at [facility], [address], [place], [place] [address]     Time coordinating discharge: Over 30 minutes  SIGNED:   Wyvonnia Dusky, MD  [facility] 12/27/2021, 11:17 AM Pager   If 7PM-7AM, please contact night-coverage

## 2021-12-26 NOTE — TOC Transition Note (Signed)
Transition of Care [facility]) - CM/SW Discharge Note   Patient Details  Name: Jesse Black MRN: [id number] Date of Birth: [birth year]  [facility] CM/SW Contact:  Izola Price, RN Phone Number: 12/26/2021, 1:56 PM   Clinical Narrative: 1/15: New admit 1/13 with Covid. PT recommending outpatient PT at discharge. From home with Spouse. Spouse able to drive patient to therapy and appointments, no issues getting medications, spouse very engaged in patient care and concerns. Discussed outpatient options with spouse after giving list and order form faxed to unit for provider if needed.  Spouse will review with patient and has RN CM phone number for call back if needed. Blood cultures pending taken this morning and spouse concerned about leaving before initial results in. Relayed concerns to provider. EDD now Monday 12/27/21.  PCP: Dr. Ramonita Lab RX: [place], [place] [facility] [place] RN CM [phone]    Final next level of care: OP Rehab Barriers to Discharge: Barriers Resolved   Patient Goals and CMS Choice        Discharge Placement                       Discharge Plan and Services                DME Arranged: N/A                    Social Determinants of Health (SDOH) Interventions     Readmission Risk Interventions No flowsheet data found.

## 2021-12-27 LAB — GLUCOSE, CAPILLARY
Glucose-Capillary: 134 mg/dL — ABNORMAL HIGH (ref 70–99)
Glucose-Capillary: 222 mg/dL — ABNORMAL HIGH (ref 70–99)

## 2021-12-27 LAB — CBC WITH DIFFERENTIAL/PLATELET
Abs Immature Granulocytes: 0.02 K/uL (ref 0.00–0.07)
Basophils Absolute: 0 K/uL (ref 0.0–0.1)
Basophils Relative: 0 %
Eosinophils Absolute: 0.2 K/uL (ref 0.0–0.5)
Eosinophils Relative: 4 %
HCT: 33.2 % — ABNORMAL LOW (ref 39.0–52.0)
Hemoglobin: 12 g/dL — ABNORMAL LOW (ref 13.0–17.0)
Immature Granulocytes: 0 %
Lymphocytes Relative: 38 %
Lymphs Abs: 2.2 K/uL (ref 0.7–4.0)
MCH: 33.1 pg (ref 26.0–34.0)
MCHC: 36.1 g/dL — ABNORMAL HIGH (ref 30.0–36.0)
MCV: 91.7 fL (ref 80.0–100.0)
Monocytes Absolute: 0.8 K/uL (ref 0.1–1.0)
Monocytes Relative: 14 %
Neutro Abs: 2.6 K/uL (ref 1.7–7.7)
Neutrophils Relative %: 44 %
Platelets: 161 K/uL (ref 150–400)
RBC: 3.62 MIL/uL — ABNORMAL LOW (ref 4.22–5.81)
RDW: 13 % (ref 11.5–15.5)
WBC: 5.9 K/uL (ref 4.0–10.5)
nRBC: 0 % (ref 0.0–0.2)

## 2021-12-27 LAB — COMPREHENSIVE METABOLIC PANEL WITH GFR
ALT: 46 U/L — ABNORMAL HIGH (ref 0–44)
AST: 35 U/L (ref 15–41)
Albumin: 3.5 g/dL (ref 3.5–5.0)
Alkaline Phosphatase: 50 U/L (ref 38–126)
Anion gap: 6 (ref 5–15)
BUN: 23 mg/dL (ref 8–23)
CO2: 27 mmol/L (ref 22–32)
Calcium: 8.7 mg/dL — ABNORMAL LOW (ref 8.9–10.3)
Chloride: 104 mmol/L (ref 98–111)
Creatinine, Ser: 1.57 mg/dL — ABNORMAL HIGH (ref 0.61–1.24)
GFR, Estimated: 49 mL/min — ABNORMAL LOW
Glucose, Bld: 172 mg/dL — ABNORMAL HIGH (ref 70–99)
Potassium: 3.8 mmol/L (ref 3.5–5.1)
Sodium: 137 mmol/L (ref 135–145)
Total Bilirubin: 0.7 mg/dL (ref 0.3–1.2)
Total Protein: 6 g/dL — ABNORMAL LOW (ref 6.5–8.1)

## 2021-12-27 NOTE — Progress Notes (Signed)
Pt to d/c home today per MD order. Discharge instructions discussed with pt. Patient verbalized understanding. Pt awaiting ride, per pt son will be here around 3 pm. To pick him up.

## 2021-12-27 NOTE — Clinical Social Work Note (Signed)
Occupational Therapy * Physical Therapy * Speech Therapy          DATE _____1/16/23________ PATIENT NAME___[name]______ PATIENT MRN__[id number]__________________  DIAGNOSIS/DIAGNOSIS CODE __U07.1, J12.82___ DATE OF DISCHARGE: ___1/16/23___________  PRIMARY CARE PHYSICIAN ____Dr Klein______________________ PCP PHONE/FAX_____[phone]______________________     Dear Provider (Name: __________________   Fax: ___________________________):   I certify that I have examined this patient and that occupational/physical/speech therapy is necessary on an outpatient basis.    The patient has expressed interest in completing their recommended course of therapy at your location.  Once a formal order from the patient's primary care physician has been obtained, please contact him/her to schedule an appointment for evaluation at your earliest convenience.   Arly.Keller  ]  Physical Therapy Evaluate and Treat          [  ]  Occupational Therapy Evaluate and Treat                                    [  ]  Speech Therapy Evaluate and Treat       The patient's primary care physician (listed above) must furnish and be responsible for a formal order such that the recommended services may be furnished while under the primary physician's care, and that the plan of care will be established and reviewed every 30 days (or more often if condition necessitates).

## 2021-12-29 LAB — CULTURE, BLOOD (ROUTINE X 2)
Culture: NO GROWTH
Special Requests: ADEQUATE
Special Requests: ADEQUATE

## 2021-12-30 ENCOUNTER — Telehealth: Payer: Self-pay | Admitting: Student

## 2021-12-30 NOTE — Telephone Encounter (Signed)
Palliative NP spoke with wife. She states patient was dx with Covid and is now home, feeling better but has cough. She is giving tessalon perles. Fluids are encouraged. She wanted to clarify if patient had Covid pneumonia. Reviewed x-ray and recent discharge summary with wife. She is to call if any needs arise.

## 2021-12-31 LAB — CULTURE, BLOOD (ROUTINE X 2)
Culture: NO GROWTH
Culture: NO GROWTH
Special Requests: ADEQUATE

## 2022-02-14 ENCOUNTER — Other Ambulatory Visit: Payer: Self-pay

## 2022-02-14 ENCOUNTER — Other Ambulatory Visit: Payer: Medicare Other | Admitting: Student

## 2022-02-14 DIAGNOSIS — F0153 Vascular dementia, unspecified severity, with mood disturbance: Secondary | ICD-10-CM

## 2022-02-14 DIAGNOSIS — Z515 Encounter for palliative care: Secondary | ICD-10-CM

## 2022-02-14 DIAGNOSIS — R52 Pain, unspecified: Secondary | ICD-10-CM

## 2022-02-14 NOTE — Progress Notes (Signed)
? ? ?Manufacturing engineer ?Community Palliative Care Consult Note ?Telephone: [phone]  ?Fax: [phone]  ? ? ?Date of encounter: 02/14/22 ?11:09 AM ?PATIENT NAME: Jesse Black ?[place] [id number]   ?[phone] (home)  ?DOB: 1956-05-29 ?MRN: [id number] ?PRIMARY CARE PROVIDER:    ?Adin Hector, MD,  ?[place] [facility]- ?[place] [address] ?[phone] ? ?REFERRING PROVIDER:   ?Adin Hector, MD ?[facility]- ?[place],  [place] [address] ?[phone] ? ?RESPONSIBLE PARTY:    ?Contact Information   ? ? Name Relation Home Work Mobile  ? Claudis, Giovanelli Spouse [phone]  [phone]  ? Audon, Heymann [phone]  [phone]  ? chandler,Aimee Daughter [phone]  [phone]  ? ?  ? ? ? ?I met face to face with patient and family in the home. Palliative Care was asked to follow this patient by consultation request of  Adin Hector, MD to address advance care planning and complex medical decision making. This is a follow up visit. ? ?                                 ASSESSMENT AND PLAN / RECOMMENDATIONS:  ? ?Advance Care Planning/Goals of Care: Goals include to maximize quality of life and symptom management. Patient/health care surrogate gave his/her permission to discuss. ?CODE STATUS: Full Code ? ?Symptom Management/Plan: ? ?Vascular dementia-continue statin, aspirin as directed.  Patient has been stable. Reorient and redirect as needed. Monitor for cognitive and functional changes. Assist with adl's as needed.  ?  ?Pain-has OA; patient reports pain is managed. Received steroid injection to right shoulder in February. Continue acetaminophen PRN as directed.  ? ?Follow up Palliative Care Visit: Palliative care will continue to follow for complex medical decision making, advance care planning, and clarification of goals. Return in 8 weeks or prn. ? ? ?This visit was coded based on medical decision  making (MDM). ? ?PPS: 60% ? ?HOSPICE ELIGIBILITY/DIAGNOSIS: TBD ? ?Chief Complaint: Palliative Medicine follow up visit.  ? ?HISTORY OF PRESENT ILLNESS:  Jesse Black is a 66 y.o. year old male  with vascular dementia, fronto-temporal dementia, CKD 3,  hypertension, hyperlipidemia, right shoulder osteoarthritis, depression, bipolar 1, OSA,  hx of CVA. Patient had Covid-19 since last palliative visit; reports being back to his baseline. ? ? ?Patient and wife reports patient doing well. Denies pain, occasional pain to his right shoulder. He had a steroid injection to right shoulder in February 2023 with effectiveness.  No falls or injury. Endorses a good appetite. Blood sugars daily range from 152-170's  mg/dL usually. No recent medication changes except metformin to be increased to BID with start of next pill pack; hemoglobin A1c 6.7. Mood has been stable; no behavioral changes. Patient sees a new psychiatrist next week. A 10-point review of systems is negative, except for the pertinent positives and negatives detailed in the HPI.  ? ?History obtained from review of EMR, discussion with primary team, and interview with family, facility staff/caregiver and/or Mr. Monnier.  ?I reviewed available labs, medications, imaging, studies and related documents from the EMR.  Records reviewed and summarized above.  ? ? ?Physical Exam: ?Pulse 78, resp 16, b/p 100/60, sats 97% on room air. ?Constitutional: NAD ?General: frail appearing ?EYES: anicteric sclera, lids intact, no discharge  ?ENMT: intact hearing, oral mucous membranes moist, dentition intact ?CV: S1S2, RRR, no LE edema ?Pulmonary:  LCTA, no increased work of breathing, no cough, room air ?Abdomen: normo-active BS + 4 quadrants, soft and non tender, no ascites ?GU: deferred ?MSK: moves all extremities, ambulatory ?Skin: warm and dry, no rashes or wounds on visible skin ?Neuro:  no generalized weakness, A & O x 3, forgetful ?Psych: non-anxious affect,  pleasant ?Hem/lymph/immuno: no widespread bruising ? ? ?Thank you for the opportunity to participate in the care of Jesse Black.  The palliative care team will continue to follow. Please call our office at [phone] if we can be of additional assistance.  ? ?Ezekiel Slocumb, NP  ? ?COVID-19 PATIENT SCREENING TOOL ?Asked and negative response unless otherwise noted:  ? ?Have you had symptoms of covid, tested positive or been in contact with someone with symptoms/positive test in the past 5-10 days? No ? ?

## 2022-04-12 ENCOUNTER — Other Ambulatory Visit: Payer: Medicare Other | Admitting: Student

## 2022-06-15 ENCOUNTER — Telehealth: Payer: Self-pay

## 2022-06-15 NOTE — Telephone Encounter (Signed)
Palliative care SW outreached patient/family for monthly telephonic visit and to schedule in person visit.  Call unsuccessful. SW left HIPPA compliant VM

## 2022-10-31 NOTE — Progress Notes (Signed)
Patient no show. Patient not home for scheduled Palliative visit.

## 2024-05-27 ENCOUNTER — Ambulatory Visit: Attending: Neurology | Admitting: Physical Therapy

## 2024-05-27 ENCOUNTER — Encounter: Payer: Self-pay | Admitting: Physical Therapy

## 2024-05-27 DIAGNOSIS — R2689 Other abnormalities of gait and mobility: Secondary | ICD-10-CM | POA: Diagnosis present

## 2024-05-27 DIAGNOSIS — R269 Unspecified abnormalities of gait and mobility: Secondary | ICD-10-CM | POA: Diagnosis present

## 2024-05-27 DIAGNOSIS — Z9181 History of falling: Secondary | ICD-10-CM | POA: Insufficient documentation

## 2024-05-27 DIAGNOSIS — M6281 Muscle weakness (generalized): Secondary | ICD-10-CM | POA: Diagnosis present

## 2024-05-27 NOTE — Therapy (Incomplete)
 OUTPATIENT PHYSICAL THERAPY NEURO EVALUATION   Patient Name: Jesse Black MRN: [id number] DOB:11/16/1956, 68 y.o., male Today's Date: 05/27/2024   PCP: *** REFERRING PROVIDER: ***  END OF SESSION:   Past Medical History:  Diagnosis Date   High blood pressure    Hyperlipidemia    Memory loss    Prediabetes    Stroke (cerebrum) (HCC)    Stroke [facility])    Past Surgical History:  Procedure Laterality Date   COLONOSCOPY WITH PROPOFOL  N/A 10/05/2015   Procedure: COLONOSCOPY WITH PROPOFOL ;  Surgeon: Cassie Click, MD;  Location: [facility] ENDOSCOPY;  Service: Endoscopy;  Laterality: N/A;   IR ANGIO INTRA EXTRACRAN SEL COM CAROTID INNOMINATE BILAT MOD SED  02/05/2019   IR ANGIO VERTEBRAL SEL SUBCLAVIAN INNOMINATE UNI R MOD SED  02/05/2019   IR ANGIO VERTEBRAL SEL VERTEBRAL UNI L MOD SED  02/05/2019   IR US  GUIDE VASC ACCESS RIGHT  02/05/2019   LOOP RECORDER INSERTION N/A 05/22/2019   Procedure: LOOP RECORDER INSERTION;  Surgeon: Percival Brace, MD;  Location: [facility] INVASIVE CV LAB;  Service: Cardiovascular;  Laterality: N/A;   Patient Active Problem List   Diagnosis Date Noted   CKD (chronic kidney disease), stage III (HCC) 12/25/2021   Encephalopathy 12/25/2021   COVID-19 12/25/2021   Pneumonia due to COVID-19 virus 12/24/2021   Erectile dysfunction 10/02/2019   Prediabetes 05/17/2019   OSA on CPAP 05/17/2019   Fatty liver 04/17/2019   Gallbladder polyp 04/17/2019   Insomnia 02/28/2019   Hyperlipidemia 01/31/2019   Intracranial atherosclerosis 01/31/2019   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   Vitamin D  deficiency 01/31/2019   CVA (cerebral vascular accident) (HCC) 01/24/2019   Annual physical exam 07/31/2015   Hypertension 07/31/2015    ONSET DATE: ***  REFERRING DIAG: ***  THERAPY DIAG:  No diagnosis found.  Rationale for Evaluation and Treatment: {HABREHAB:27488}  SUBJECTIVE:                                                                                                                                                                                              SUBJECTIVE STATEMENT: *** Pt accompanied by: {accompnied:27141}  PERTINENT HISTORY: ***  PAIN:  Are you having pain? {OPRCPAIN:27236}  PRECAUTIONS: {Therapy precautions:24002}  RED FLAGS: {PT Red Flags:29287}   WEIGHT BEARING RESTRICTIONS: {Yes ***/No:24003}  FALLS: Has patient fallen in last 6 months? {fallsyesno:27318}  LIVING ENVIRONMENT: Lives with: {OPRC lives with:25569::lives with their family} Lives in: {Lives in:25570} Stairs: {opstairs:27293} Has following equipment at home: {Assistive devices:23999}  PLOF: {PLOF:24004}  PATIENT GOALS: ***  OBJECTIVE:  Note: Objective measures were completed at Evaluation unless  otherwise noted.  DIAGNOSTIC FINDINGS: ***  COGNITION: Overall cognitive status: {cognition:24006}   SENSATION: {sensation:27233}  COORDINATION: ***  EDEMA:  {edema:24020}  MUSCLE TONE: {LE tone:25568}  MUSCLE LENGTH: Hamstrings: Right *** deg; Left *** deg Andy Bannister test: Right *** deg; Left *** deg  DTRs:  {DTR SITE:24025}  POSTURE: {posture:25561}  LOWER EXTREMITY ROM:     {AROM/PROM:27142}  Right Eval Left Eval  Hip flexion    Hip extension    Hip abduction    Hip adduction    Hip internal rotation    Hip external rotation    Knee flexion    Knee extension    Ankle dorsiflexion    Ankle plantarflexion    Ankle inversion    Ankle eversion     (Blank rows = not tested)  LOWER EXTREMITY MMT:    MMT Right Eval Left Eval  Hip flexion    Hip extension    Hip abduction    Hip adduction    Hip internal rotation    Hip external rotation    Knee flexion    Knee extension    Ankle dorsiflexion    Ankle plantarflexion    Ankle inversion    Ankle eversion    (Blank rows = not tested)  BED MOBILITY:  {bed mobility:32615:p}  TRANSFERS: {transfers eval:32620}  RAMP:  {ramp eval:32616}  CURB:   {curb eval:32617}  STAIRS: {stairs eval:32618} GAIT: Findings: {GaitneuroPT:32644::Distance walked: ***,Comments: ***}  FUNCTIONAL TESTS:  {Functional tests:24029}  PATIENT SURVEYS:  {rehab surveys:24030}                                                                                                                              TREATMENT DATE: ***    PATIENT EDUCATION: Education details: *** Person educated: {Person educated:25204} Education method: {Education Method:25205} Education comprehension: {Education Comprehension:25206}  HOME EXERCISE PROGRAM: ***  GOALS: Goals reviewed with patient? {yes/no:20286}  SHORT TERM GOALS: Target date: ***  *** Baseline: Goal status: INITIAL  2.  *** Baseline:  Goal status: INITIAL  3.  *** Baseline:  Goal status: INITIAL  4.  *** Baseline:  Goal status: INITIAL  5.  *** Baseline:  Goal status: INITIAL  6.  *** Baseline:  Goal status: INITIAL  LONG TERM GOALS: Target date: ***  *** Baseline:  Goal status: INITIAL  2.  *** Baseline:  Goal status: INITIAL  3.  *** Baseline:  Goal status: INITIAL  4.  *** Baseline:  Goal status: INITIAL  5.  *** Baseline:  Goal status: INITIAL  6.  *** Baseline:  Goal status: INITIAL  ASSESSMENT:  CLINICAL IMPRESSION: Patient is a *** y.o. *** who was seen today for physical therapy evaluation and treatment for ***.   OBJECTIVE IMPAIRMENTS: {opptimpairments:25111}.   ACTIVITY LIMITATIONS: {activitylimitations:27494}  PARTICIPATION LIMITATIONS: {participationrestrictions:25113}  PERSONAL FACTORS: {Personal factors:25162} are also affecting patient's functional outcome.   REHAB POTENTIAL: {rehabpotential:25112}  CLINICAL DECISION MAKING: {clinical decision making:25114}  EVALUATION COMPLEXITY: {  Evaluation complexity:25115}  PLAN:  PT FREQUENCY: {rehab frequency:25116}  PT DURATION: {rehab duration:25117}  PLANNED INTERVENTIONS: {rehab  planned interventions:25118::97110-Therapeutic exercises,97530- Therapeutic [phone]- Neuromuscular re-education,97535- Self JXBJ,47829- Manual therapy}  PLAN FOR NEXT SESSION: ***   Edwina Gram, PT 05/27/2024, 10:16 AM

## 2024-05-27 NOTE — Therapy (Unsigned)
 OUTPATIENT PHYSICAL THERAPY NEURO EVALUATION   Patient Name: Jesse Black MRN: [id number] DOB:September 01, 1956, 68 y.o., male Today's Date: 05/28/2024   PCP: Melchor Spoon, MD REFERRING PROVIDER: Rosan Comfort, MD   END OF SESSION:  PT End of Session - 05/27/24 1328     Visit Number 1    Number of Visits 16 (P)     Date for PT Re-Evaluation 11/13/24    PT Start Time 1445    PT Stop Time 1535    PT Time Calculation (min) 50 min    Equipment Utilized During Treatment Gait belt    Activity Tolerance Patient tolerated treatment well    Behavior During Therapy WFL for tasks assessed/performed          Past Medical History:  Diagnosis Date   High blood pressure    Hyperlipidemia    Memory loss    Prediabetes    Stroke (cerebrum) (HCC)    Stroke [facility])    Past Surgical History:  Procedure Laterality Date   COLONOSCOPY WITH PROPOFOL  N/A 10/05/2015   Procedure: COLONOSCOPY WITH PROPOFOL ;  Surgeon: Cassie Click, MD;  Location: [facility] ENDOSCOPY;  Service: Endoscopy;  Laterality: N/A;   IR ANGIO INTRA EXTRACRAN SEL COM CAROTID INNOMINATE BILAT MOD SED  02/05/2019   IR ANGIO VERTEBRAL SEL SUBCLAVIAN INNOMINATE UNI R MOD SED  02/05/2019   IR ANGIO VERTEBRAL SEL VERTEBRAL UNI L MOD SED  02/05/2019   IR US  GUIDE VASC ACCESS RIGHT  02/05/2019   LOOP RECORDER INSERTION N/A 05/22/2019   Procedure: LOOP RECORDER INSERTION;  Surgeon: Percival Brace, MD;  Location: [facility] INVASIVE CV LAB;  Service: Cardiovascular;  Laterality: N/A;   Patient Active Problem List   Diagnosis Date Noted   CKD (chronic kidney disease), stage III (HCC) 12/25/2021   Encephalopathy 12/25/2021   COVID-19 12/25/2021   Pneumonia due to COVID-19 virus 12/24/2021   Erectile dysfunction 10/02/2019   Prediabetes 05/17/2019   OSA on CPAP 05/17/2019   Fatty liver 04/17/2019   Gallbladder polyp 04/17/2019   Insomnia 02/28/2019   Hyperlipidemia 01/31/2019   Intracranial atherosclerosis 01/31/2019    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   Vitamin D  deficiency 01/31/2019   CVA (cerebral vascular accident) (HCC) 01/24/2019   Annual physical exam 07/31/2015   Hypertension 07/31/2015    ONSET DATE: Jan 25, 2019   REFERRING DIAG: M62.59 (ICD-10-CM) - Muscle wasting and atrophy, not elsewhere classified, multiple sites   THERAPY DIAG:  Unspecified abnormalities of gait and mobility  Muscle weakness (generalized)  Personal history of fall  Other abnormalities of gait and mobility  Rationale for Evaluation and Treatment: Rehabilitation  SUBJECTIVE:  SUBJECTIVE STATEMENT: Pt reports to PT feeling okay. States he has been doing okay, and that he feels he can do everything he wants to do. Pt wife sates that he has had some cognitive deficits and has previously been displaying erratic behavior which friends and family corroborate. States that it has been more under control recently with updated meds. Pt presents today at the clinic for a referral of muscle wasting/atrophy.   Reports he jogs 8/10 of a mile in the evenings. Wife reports worry with this as he has no alert system to notify anyone if he falls or gets lost. She reports he witnessed him jogging and claims he does more of a hunched over fast paced walk. Wife described this situation to the SPT in a whisper so that her husband could not hear.   Pt wife reports that he often downplays or misremember's his limitations. AEB their significantly different scores on his LEFS.   Pt's son is a Systems analyst, so he states he will be able to help out as needed with getting rehab equipment, life alert system and help him with HEP.     Pt accompanied by: significant other  PERTINENT HISTORY: From recent visit c Dr. Mason Sole: Jesse Black is a 68 year  old male with cognitive impairment and a history of stroke who presents for follow-up of memory problems.  He has not experienced significant changes in mood or memory since his last visit. He denies any stroke-like symptoms. He has a history of a fall in May 2025 without serious injuries. He has been evaluated for cognitive impairment post-stroke versus mixed dementia at the [facility] and has seen multiple specialists. His current medications include lamotrigine , gabapentin , and olanzapine .  He experiences right-sided jaw pain radiating towards his ear, which occasionally pops when eating, though the pain is not present today. He has been taking Tylenol  and ibuprofen for this pain. A recent dental evaluation noted changes in his chewing pattern due to missing teeth, which may contribute to the jaw pain.  He has a history of generalized slow shuffling gait, hypophonia, decreased range of motion, mild bradykinesia, mild coagulant of his left hand, and left hand resting tremor. There is no family history of Parkinson's disease, and a skin biopsy for alpha-synuclein accumulation was negative.  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 Initially, there was concern for frontotemporal dementia, but a psychiatrist considered it may be related to bipolar disorder. He has shown improvement with treatment.  He is on multiple medications including rosuvastatin 20 mg, aspirin , Plavix , metformin, lisinopril , and metoprolol . He is concerned about his glucose levels and sweet eating habits and has been trying to manage his diet by avoiding sweets and opting for healthier alternatives.  He engages in physical activity, walking about a mile daily, but there are concerns about his safety during these walks as he does not carry any alert device. He has moved to a new community with no  stairs, which is beneficial for his mobility.   PAIN:  Are you having pain? No   PRECAUTIONS: None  RED FLAGS: None   WEIGHT BEARING RESTRICTIONS: No  FALLS: Has patient fallen in last 6 months? Yes. Number of falls 1 approx. 1 mo ago - at a Jet show - Head was turned up looking at a Jet flying overhead while walking and lost his balance.   LIVING ENVIRONMENT: Lives with:  lives with their family and lives with their spouse Lives in: House/apartment Stairs: No Watches granddaughter on Mondays and Fridays Has following equipment at home: None   PLOF: Independent  PATIENT GOALS: Get stronger, Get back to driving (Failed driving eval twice), increase jogging distance, reduce shuffling c gait.   OBJECTIVE:  Note: Objective measures were completed at Evaluation unless otherwise noted.  DIAGNOSTIC FINDINGS: CT Head 01/24/2019: Small lacunar infarcts within the LEFT thalamus and LEFT external capsule, favored to be remote or subacute. Periventricular white matter changes consistent with small vessel disease. No evidence for acute intracranial abnormality.  MRI brain 01/24/2019: 1.2 cm acute infarction at the base of the brain on the left at the junction of the cerebral peduncle, anterior thalamus and radiating white matter tracts. Extensive old small vessel type infarctions elsewhere throughout the brain as outlined above.  MRI Brain with and without contrast 05/30/2019: No acute intracranial abnormality. Moderate chronic small vessel ischemic disease with multiple chronic lacunar infarcts as above.Unchanged chronic microhemorrhages in the left basal ganglia and pons.   COGNITION: Overall cognitive status: History of cognitive impairments - at baseline    MUSCLE TONE: Increased tone BUE bicep cogwheel rigidity   POSTURE: forward head  LOWER EXTREMITY ROM:     Active  Right Eval Left Eval  Hip flexion [facility]  Hip extension    Hip abduction [facility]  Hip adduction [facility]   Hip internal rotation    Hip external rotation    Knee flexion [facility] WFL  Knee extension [facility]  Ankle dorsiflexion [facility] WFL  Ankle plantarflexion [facility] WFL  Ankle inversion    Ankle eversion     (Blank rows = not tested)  LOWER EXTREMITY MMT:    MMT Right Eval Left Eval  Hip flexion 4 4+   Hip extension    Hip abduction 4+ 4+  Hip adduction 5 5  Hip internal rotation    Hip external rotation    Knee flexion 5 4+  Knee extension 5 5  Ankle dorsiflexion 4+ 4+  Ankle plantarflexion    Ankle inversion    Ankle eversion    (Blank rows = not tested)  BED MOBILITY:  Not tested  TRANSFERS: Sit to stand: Complete Independence  Assistive device utilized: None      GAIT: Findings: Gait Characteristics: step through pattern, decreased arm swing- Right, decreased arm swing- Left, poor foot clearance- Right, and poor foot clearance- Left, Distance walked: 40, Level of assistance: Complete Independence, and Comments:    FUNCTIONAL TESTS:  30 seconds chair stand test = 17  PATIENT SURVEYS:  LEFS  Extreme difficulty/unable (0), Quite a bit of difficulty (1), Moderate difficulty (2), Little difficulty (3), No difficulty (4) Survey date:    Any of your usual work, housework or school activities 4  2. Usual hobbies, recreational or sporting activities 2  3. Getting into/out of the bath 4  4. Walking between rooms 4  5. Putting on socks/shoes 2  6. Squatting  4  7. Lifting an object, like a bag of groceries from the floor 3  8. Performing light activities around your home 4  9. Performing heavy activities around your home 4  10. Getting into/out of a car 3  11. Walking 2 blocks 3  12. Walking 1 mile 3  13. Going up/down 10 stairs (1 flight) 4  14. Standing for 1 hour 3  15.  sitting for 1 hour 4  16. Running on even ground 2  17. Running on uneven ground 2  18. Making sharp turns while running fast 4*  19. Hopping  4*  20. Rolling over in bed 4  Score total:  63    (*)  indicates his wife was not able to confirm or deny pt's self reported score. - Above score pt wife fills out as pt is reported to not be completely aware of his deficits                                                                                                                               TREATMENT DATE: 05/27/2024   Physical therapy treatment session today consisted of subjective history taking and administration of testing as demonstrated and documented in flow sheet, treatment, and goals section of this note. Addition treatments may be found below.    Self-Care  Educated pt on available alert system resources (life alert, Garmin watch, Apple watch), reasoning behind MMTs, and education on tone.  PATIENT EDUCATION: Education details: Educated pt as to various life alert systems, reasoning for muscle testing, tone  Person educated: Patient and Spouse Education method: Explanation Education comprehension: verbalized understanding  HOME EXERCISE PROGRAM: Deferred d/t time - give next session  GOALS: Goals reviewed with patient? No  SHORT TERM GOALS: Target date: 07/08/2024    Pt will have no difficulty performing and maintaining HEP. Baseline:  Not yet given Goal status: INITIAL    LONG TERM GOALS: Target date: 07/08/2024    Pt will display no difficulty lifting a 5-10 pound object off the floor with no signs of imbalance for improved safety with functional tasks and chores Baseline: A little bit of difficulty Goal status: INITIAL  2.  Pt will increase reps during 30 second chair stand test by 3 to demonstrate improved LE power for functional activities.  Baseline: 17 Goal status: INITIAL  4.  Pt will increase his score on the LEFS by 9 points to indicate improved capacity for functional activities.  Baseline: 67 Goal status: INITIAL  5.  Pt will increase score on mini-best by 3 points in order to decrease risk for falls associated with static and dynamic balance.   Baseline: Not yet tested Goal status: INITIAL  6.  Pt will be able to jog 1 mile independently with no difficulty to demonstrate ability to perform endurance and strength training as part of his personal home exercise program Baseline: a little bit of difficulty, barely a jog ( more like fast walk) per his wife   Goal status: INITIAL     ASSESSMENT:  CLINICAL IMPRESSION: Patient is a 68 y.o. Male who was seen today for physical therapy evaluation and treatment for muscle wasting/atrophy. Pt presents to PT in good spirits with his wife. He states that he currently can do mostly everything he wants to do functionally, however his wife disagrees AEB vastly different LEFS scores. Pt displays good LE strength AEB 30 second chair stand test and MMTs  however does exhibit some deficits. Pt would benefit from skilled PT, specifically higher level strengthening interventions, to address aforementioned deficits. Pt's wife seems to avoid talking about his dementia in front of the pt. Conversing with PT in a quiet tone out of range of her husbands hearing when discussing how the dementia has affected him. Will conduct high level formal balance testing next session to assess impairments related to balance. Pt may benefit from caregiver education to address questions related to dementia and inform her about aspects of dementia such as sundowning.   OBJECTIVE IMPAIRMENTS: Abnormal gait, decreased cognition, decreased knowledge of condition, decreased safety awareness, impaired perceived functional ability, and impaired tone.   ACTIVITY LIMITATIONS: carrying and lifting  PARTICIPATION LIMITATIONS: driving and community activity  PERSONAL FACTORS: Time since onset of injury/illness/exacerbation and 1-2 comorbidities: CVA and dementia are also affecting patient's functional outcome.   REHAB POTENTIAL: Good  CLINICAL DECISION MAKING: Stable/uncomplicated  EVALUATION COMPLEXITY: Moderate  PLAN:  PT  FREQUENCY: 2x/week  PT DURATION: 6 weeks  PLANNED INTERVENTIONS: 97110-Therapeutic exercises, 97530- Therapeutic activity, 97112- Neuromuscular re-education, 97535- Self Care, and 19147- Manual therapy  PLAN FOR NEXT SESSION: Mini-BesTest, assessment of pt running form, Functional endurance outcome measure, give HEP, initiate LE strengthening, examine and address R shoulder if warranted.   Edwina Gram, PT 05/28/2024, 8:08 AM

## 2024-05-30 ENCOUNTER — Ambulatory Visit: Admitting: Physical Therapy

## 2024-05-30 DIAGNOSIS — R269 Unspecified abnormalities of gait and mobility: Secondary | ICD-10-CM

## 2024-05-30 DIAGNOSIS — Z9181 History of falling: Secondary | ICD-10-CM

## 2024-05-30 DIAGNOSIS — R2689 Other abnormalities of gait and mobility: Secondary | ICD-10-CM

## 2024-05-30 DIAGNOSIS — M6281 Muscle weakness (generalized): Secondary | ICD-10-CM

## 2024-05-30 NOTE — Therapy (Signed)
 OUTPATIENT PHYSICAL THERAPY NEURO TREATMENT   Patient Name: Jesse Black MRN: [id number] DOB:[birth year], 68 y.o., male Today's Date: 05/30/2024   PCP: Melchor Spoon, MD REFERRING PROVIDER: Rosan Comfort, MD   END OF SESSION:  PT End of Session - 05/30/24 1404     Visit Number 2    Number of Visits 12    Date for PT Re-Evaluation 11/13/24    PT Start Time 1405    PT Stop Time 1446    PT Time Calculation (min) 41 min    Equipment Utilized During Treatment Gait belt    Activity Tolerance Patient tolerated treatment well    Behavior During Therapy WFL for tasks assessed/performed          Past Medical History:  Diagnosis Date   High blood pressure    Hyperlipidemia    Memory loss    Prediabetes    Stroke (cerebrum) (HCC)    Stroke [facility])    Past Surgical History:  Procedure Laterality Date   COLONOSCOPY WITH PROPOFOL  N/A 10/05/2015   Procedure: COLONOSCOPY WITH PROPOFOL ;  Surgeon: Cassie Click, MD;  Location: [facility] ENDOSCOPY;  Service: Endoscopy;  Laterality: N/A;   IR ANGIO INTRA EXTRACRAN SEL COM CAROTID INNOMINATE BILAT MOD SED  02/05/2019   IR ANGIO VERTEBRAL SEL SUBCLAVIAN INNOMINATE UNI R MOD SED  02/05/2019   IR ANGIO VERTEBRAL SEL VERTEBRAL UNI L MOD SED  02/05/2019   IR US  GUIDE VASC ACCESS RIGHT  02/05/2019   LOOP RECORDER INSERTION N/A 05/22/2019   Procedure: LOOP RECORDER INSERTION;  Surgeon: Percival Brace, MD;  Location: [facility] INVASIVE CV LAB;  Service: Cardiovascular;  Laterality: N/A;   Patient Active Problem List   Diagnosis Date Noted   CKD (chronic kidney disease), stage III (HCC) 12/25/2021   Encephalopathy 12/25/2021   COVID-19 12/25/2021   Pneumonia due to COVID-19 virus 12/24/2021   Erectile dysfunction 10/02/2019   Prediabetes 05/17/2019   OSA on CPAP 05/17/2019   Fatty liver 04/17/2019   Gallbladder polyp 04/17/2019   Insomnia 02/28/2019   Hyperlipidemia 01/31/2019   Intracranial atherosclerosis 01/31/2019   Stenosis of  right carotid artery 01/31/2019   Vitamin D  deficiency 01/31/2019   CVA (cerebral vascular accident) (HCC) 01/24/2019   Annual physical exam 07/31/2015   Hypertension 07/31/2015    ONSET DATE: Jan 25, 2019   REFERRING DIAG: M62.59 (ICD-10-CM) - Muscle wasting and atrophy, not elsewhere classified, multiple sites   THERAPY DIAG:  Unspecified abnormalities of gait and mobility  Other abnormalities of gait and mobility  Personal history of fall  Muscle weakness (generalized)  Rationale for Evaluation and Treatment: Rehabilitation  SUBJECTIVE:  SUBJECTIVE STATEMENT:  Pt reports he is feeling good today. He is accompanied by his wife. No pain or falls since last visit.    From Eval: Pt reports to PT feeling okay. States he has been doing okay, and that he feels he can do everything he wants to do. Pt wife sates that he has had some cognitive deficits and has previously been displaying erratic behavior which friends and family corroborate. States that it has been more under control recently with updated meds. Pt presents today at the clinic for a referral of muscle wasting/atrophy.   Reports he jogs 8/10 of a mile in the evenings. Wife reports worry with this as he has no alert system to notify anyone if he falls or gets lost. She reports he witnessed him jogging and claims he does more of a hunched over fast paced walk. Wife described this situation to the SPT in a whisper so that her husband could not hear.   Pt wife reports that he often downplays or misremember's his limitations. AEB their significantly different scores on his LEFS.   Pt's son is a Systems analyst, so he states he will be able to help out as needed with getting rehab equipment, life alert system and help him with HEP.     Pt  accompanied by: significant other  PERTINENT HISTORY: From recent visit c Dr. Mason Sole: Sumner Kirchman is a 68 year old male with cognitive impairment and a history of stroke who presents for follow-up of memory problems.  He has not experienced significant changes in mood or memory since his last visit. He denies any stroke-like symptoms. He has a history of a fall in May 2025 without serious injuries. He has been evaluated for cognitive impairment post-stroke versus mixed dementia at the [facility] and has seen multiple specialists. His current medications include lamotrigine , gabapentin , and olanzapine .  He experiences right-sided jaw pain radiating towards his ear, which occasionally pops when eating, though the pain is not present today. He has been taking Tylenol  and ibuprofen for this pain. A recent dental evaluation noted changes in his chewing pattern due to missing teeth, which may contribute to the jaw pain.  He has a history of generalized slow shuffling gait, hypophonia, decreased range of motion, mild bradykinesia, mild coagulant of his left hand, and left hand resting tremor. There is no family history of Parkinson's disease, and a skin biopsy for alpha-synuclein accumulation was negative.  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 Initially, there was concern for frontotemporal dementia, but a psychiatrist considered it may be related to bipolar disorder. He has shown improvement with treatment.  He is on multiple medications including rosuvastatin 20 mg, aspirin , Plavix , metformin, lisinopril , and metoprolol . He is concerned about his glucose levels and sweet eating habits and has been trying to manage his diet by avoiding sweets and opting for healthier alternatives.  He engages in physical activity, walking about a mile daily, but there are concerns  about his safety during these walks as he does not carry any alert device. He has moved to a new community with no stairs, which is beneficial for his mobility.   PAIN:  Are you having pain? No   PRECAUTIONS: None  RED FLAGS: None   WEIGHT BEARING RESTRICTIONS: No  FALLS: Has patient fallen in last 6 months? Yes. Number of falls 1 approx. 1 mo ago - at  a Jet show - Head was turned up looking at a Jet flying overhead while walking and lost his balance.   LIVING ENVIRONMENT: Lives with: lives with their family and lives with their spouse Lives in: House/apartment Stairs: No Watches granddaughter on Mondays and Fridays Has following equipment at home: None   PLOF: Independent  PATIENT GOALS: Get stronger, Get back to driving (Failed driving eval twice), increase jogging distance, reduce shuffling c gait.   OBJECTIVE:  Note: Objective measures were completed at Evaluation unless otherwise noted.  DIAGNOSTIC FINDINGS: CT Head 01/24/2019: Small lacunar infarcts within the LEFT thalamus and LEFT external capsule, favored to be remote or subacute. Periventricular white matter changes consistent with small vessel disease. No evidence for acute intracranial abnormality.  MRI brain 01/24/2019: 1.2 cm acute infarction at the base of the brain on the left at the junction of the cerebral peduncle, anterior thalamus and radiating white matter tracts. Extensive old small vessel type infarctions elsewhere throughout the brain as outlined above.  MRI Brain with and without contrast 05/30/2019: No acute intracranial abnormality. Moderate chronic small vessel ischemic disease with multiple chronic lacunar infarcts as above.Unchanged chronic microhemorrhages in the left basal ganglia and pons.   COGNITION: Overall cognitive status: History of cognitive impairments - at baseline    MUSCLE TONE: Increased tone BUE bicep cogwheel rigidity   POSTURE: forward head  LOWER EXTREMITY ROM:     Active   Right Eval Left Eval  Hip flexion [facility]  Hip extension    Hip abduction [facility]  Hip adduction [facility]  Hip internal rotation    Hip external rotation    Knee flexion [facility] WFL  Knee extension [facility]  Ankle dorsiflexion [facility] WFL  Ankle plantarflexion [facility] WFL  Ankle inversion    Ankle eversion     (Blank rows = not tested)  LOWER EXTREMITY MMT:    MMT Right Eval Left Eval  Hip flexion 4 4+   Hip extension    Hip abduction 4+ 4+  Hip adduction 5 5  Hip internal rotation    Hip external rotation    Knee flexion 5 4+  Knee extension 5 5  Ankle dorsiflexion 4+ 4+  Ankle plantarflexion    Ankle inversion    Ankle eversion    (Blank rows = not tested)  BED MOBILITY:  Not tested  TRANSFERS: Sit to stand: Complete Independence  Assistive device utilized: None      GAIT: Findings: Gait Characteristics: step through pattern, decreased arm swing- Right, decreased arm swing- Left, poor foot clearance- Right, and poor foot clearance- Left, Distance walked: 40, Level of assistance: Complete Independence, and Comments:    FUNCTIONAL TESTS:  30 seconds chair stand test = 17  PATIENT SURVEYS:  LEFS  Extreme difficulty/unable (0), Quite a bit of difficulty (1), Moderate difficulty (2), Little difficulty (3), No difficulty (4) Survey date:    Any of your usual work, housework or school activities 4  2. Usual hobbies, recreational or sporting activities 2  3. Getting into/out of the bath 4  4. Walking between rooms 4  5. Putting on socks/shoes 2  6. Squatting  4  7. Lifting an object, like a bag of groceries from the floor 3  8. Performing light activities around your home 4  9. Performing heavy activities around your home 4  10. Getting into/out of a car 3  11. Walking 2 blocks 3  12. Walking 1 mile 3  13. Going up/down 10  stairs (1 flight) 4  14. Standing for 1 hour 3  15.  sitting for 1 hour 4  16. Running on even ground 2  17. Running on uneven ground 2  18. Making  sharp turns while running fast 4*  19. Hopping  4*  20. Rolling over in bed 4  Score total:  64    (*) indicates his wife was not able to confirm or deny pt's self reported score. - Above score pt wife fills out as pt is reported to not be completely aware of his deficits                                                                                                                               TREATMENT DATE: 05/27/2024   Physical therapy treatment session today consisted of administration of testing as demonstrated and documented in flow sheet, treatment, and goals section of this note. Addition treatments may be found below.                          Physical Performance   Pt scores 23 / 28 on mini BEST balance test. Scores < 16 indicate increased risk for falls [facility]arie Gauze, [place], [facility] [place]) and MCID is 4 (Godi,et al, 2013)    [facility] PT Assessment - 05/30/24 0001       Standardized Balance Assessment   Standardized Balance Assessment Mini-BESTest      Mini-BESTest   Sit To Stand Normal: Comes to stand without use of hands and stabilizes independently.    Rise to Toes Moderate: Heels up, but not full range (smaller than when holding hands), OR noticeable instability for 3 s.    Stand on one leg (left) Normal: 20 s.    Stand on one leg (right) Moderate: < 20 s    Stand on one leg - lowest score 1    Compensatory Stepping Correction - Forward Normal: Recovers independently with a single, large step (second realignement is allowed).    Compensatory Stepping Correction - Backward Moderate: More than one step is required to recover equilibrium    Compensatory Stepping Correction - Left Lateral Normal: Recovers independently with 1 step (crossover or lateral OK)    Compensatory Stepping Correction - Right Lateral Moderate: Several steps to recover equilibrium    Stepping Corredtion Lateral - lowest score 1    Stance - Feet together, eyes open, firm surface  Normal: 30s    Stance - Feet  together, eyes closed, foam surface  Normal: 30s    Incline - Eyes Closed Normal: Stands independently 30s and aligns with gravity    Change in Gait Speed Normal: Significantly changes walkling speed without imbalance    Walk with head turns - Horizontal Normal: performs head turns with no change in gait speed and good balance    Walk with pivot turns Normal: Turns with feet close FAST (< 3 steps) with good  balance.    Step over obstacles Normal: Able to step over box with minimal change of gait speed and with good balance.    Timed UP & GO with Dual Task Severe: Stops counting while walking OR stops walking while counting.    Mini-BEST total score 22             Run assessment - Length of hallway x2.  3 min treadmill with incline gradually increased to 4.0 and speed of 3.33mph. Noted decreased arm swing.   Self-Care  Educated pt on proper arm swing during jogging to aid in balance during runs. Educated pt and caregiver on silver sneaker classes offered at [facility] and questions regarding operation hours and class schedule.     PATIENT EDUCATION: Education details: Educated pt as to various life alert systems, reasoning for muscle testing, tone  Person educated: Patient and Spouse Education method: Explanation Education comprehension: verbalized understanding  HOME EXERCISE PROGRAM: Deferred d/t time - give next session  GOALS: Goals reviewed with patient? No  SHORT TERM GOALS: Target date: 07/08/2024    Pt will have no difficulty performing and maintaining HEP. Baseline:  Not yet given Goal status: INITIAL    LONG TERM GOALS: Target date: 07/08/2024    Pt will display no difficulty lifting a 5-10 pound object off the floor with no signs of imbalance for improved safety with functional tasks and chores Baseline: A little bit of difficulty Goal status: INITIAL  2.  Pt will increase reps during 30 second chair stand test by 3 to demonstrate improved LE power for  functional activities.  Baseline: 17 Goal status: INITIAL  4.  Pt will increase his score on the LEFS by 9 points to indicate improved capacity for functional activities.  Baseline: 67 Goal status: INITIAL  5.  Pt will increase score on mini-best by 3 points in order to decrease risk for falls associated with static and dynamic balance.  Baseline: 6/19: 22 Goal status: INITIAL  6.  Pt will be able to jog 1 mile independently with no difficulty to demonstrate ability to perform endurance and strength training as part of his personal home exercise program Baseline: a little bit of difficulty, barely a jog ( more like fast walk) per his wife   Goal status: INITIAL     ASSESSMENT:  CLINICAL IMPRESSION:  Pt presented to clinic in good spirits ready for PT. He was accompanied by his wife. Pt displays good balance as evident by his score on the MiniBESTest, which places him as a low fall risk. Pt did exhibit some difficulties with the mini-bestest including: rising to toes, lateral and backwards postural reactions, and dual tasking. Run assessments showed decreased arm swing during running activities which may lead to decreased balance and displaced momentum during runs increasing his risk of falling. Pt will benefit from skilled PT to address balance deficits and improve running form to decrease fall risk and improve overall QOL.   OBJECTIVE IMPAIRMENTS: Abnormal gait, decreased cognition, decreased knowledge of condition, decreased safety awareness, impaired perceived functional ability, and impaired tone.   ACTIVITY LIMITATIONS: carrying and lifting  PARTICIPATION LIMITATIONS: driving and community activity  PERSONAL FACTORS: Time since onset of injury/illness/exacerbation and 1-2 comorbidities: CVA and dementia are also affecting patient's functional outcome.   REHAB POTENTIAL: Good  CLINICAL DECISION MAKING: Stable/uncomplicated  EVALUATION COMPLEXITY: Moderate  PLAN:  PT  FREQUENCY: 2x/week  PT DURATION: 6 weeks  PLANNED INTERVENTIONS: 97110-Therapeutic exercises, 97530- Therapeutic activity, [id number]- Neuromuscular re-education, 97535- Self  Care, and [id number]- Manual therapy  PLAN FOR NEXT SESSION: give HEP, initiate LE strengthening, examine and address R shoulder if warranted, balance training to address deficits according to mini-BESTest. Educate caregiver on sundowning for dementia pts.    Edwina Gram, PT 05/30/2024, 3:59 PM

## 2024-06-04 ENCOUNTER — Ambulatory Visit: Admitting: Physical Therapy

## 2024-06-06 ENCOUNTER — Ambulatory Visit: Admitting: Physical Therapy

## 2024-06-06 ENCOUNTER — Telehealth: Payer: Self-pay | Admitting: Physical Therapy

## 2024-06-06 ENCOUNTER — Encounter: Payer: Self-pay | Admitting: Physical Therapy

## 2024-06-06 DIAGNOSIS — R2689 Other abnormalities of gait and mobility: Secondary | ICD-10-CM

## 2024-06-06 DIAGNOSIS — M6281 Muscle weakness (generalized): Secondary | ICD-10-CM

## 2024-06-06 DIAGNOSIS — R269 Unspecified abnormalities of gait and mobility: Secondary | ICD-10-CM

## 2024-06-06 DIAGNOSIS — Z9181 History of falling: Secondary | ICD-10-CM

## 2024-06-06 NOTE — Telephone Encounter (Signed)
 Pt contacted via telephone on 06/04/24 and author left voice mail informing of missed appointment and informed pt of future PT appointment date and time.   Lonni Gainer PT, DPT

## 2024-06-06 NOTE — Therapy (Signed)
 OUTPATIENT PHYSICAL THERAPY NEURO TREATMENT   Patient Name: Jesse Black MRN: [id number] DOB:09/08/1956, 68 y.o., male Today's Date: 06/06/2024   PCP: Jesse DOROTHA Fernande DOUGLAS, MD REFERRING PROVIDER: [name], MD   END OF SESSION:  PT End of Session - 06/06/24 1348     Visit Number 3 (P)     Number of Visits 12 (P)     Date for PT Re-Evaluation 11/13/24 (P)     PT Start Time 1400 (P)     PT Stop Time 1445 (P)     PT Time Calculation (min) 45 min (P)     Equipment Utilized During Treatment Gait belt (P)     Activity Tolerance Patient tolerated treatment well (P)     Behavior During Therapy WFL for tasks assessed/performed (P)           Past Medical History:  Diagnosis Date   High blood pressure    Hyperlipidemia    Memory loss    Prediabetes    Stroke (cerebrum) (HCC)    Stroke [facility])    Past Surgical History:  Procedure Laterality Date   COLONOSCOPY WITH PROPOFOL  N/A 10/05/2015   Procedure: COLONOSCOPY WITH PROPOFOL ;  Surgeon: Jesse ONEIDA Holmes, MD;  Location: [facility] ENDOSCOPY;  Service: Endoscopy;  Laterality: N/A;   IR ANGIO INTRA EXTRACRAN SEL COM CAROTID INNOMINATE BILAT MOD SED  02/05/2019   IR ANGIO VERTEBRAL SEL SUBCLAVIAN INNOMINATE UNI R MOD SED  02/05/2019   IR ANGIO VERTEBRAL SEL VERTEBRAL UNI L MOD SED  02/05/2019   IR US  GUIDE VASC ACCESS RIGHT  02/05/2019   LOOP RECORDER INSERTION N/A 05/22/2019   Procedure: LOOP RECORDER INSERTION;  Surgeon: Jesse Blunt, MD;  Location: [facility] INVASIVE CV LAB;  Service: Cardiovascular;  Laterality: N/A;   Patient Active Problem List   Diagnosis Date Noted   CKD (chronic kidney disease), stage III (HCC) 12/25/2021   Encephalopathy 12/25/2021   COVID-19 12/25/2021   Pneumonia due to COVID-19 virus 12/24/2021   Erectile dysfunction 10/02/2019   Prediabetes 05/17/2019   OSA on CPAP 05/17/2019   Fatty liver 04/17/2019   Gallbladder polyp 04/17/2019   Insomnia 02/28/2019   Hyperlipidemia 01/31/2019    Intracranial atherosclerosis 01/31/2019   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   Vitamin D  deficiency 01/31/2019   CVA (cerebral vascular accident) (HCC) 01/24/2019   Annual physical exam 07/31/2015   Hypertension 07/31/2015    ONSET DATE: Jan 25, 2019   REFERRING DIAG: M62.59 (ICD-10-CM) - Muscle wasting and atrophy, not elsewhere classified, multiple sites   THERAPY DIAG:  Unspecified abnormalities of gait and mobility  Other abnormalities of gait and mobility  Personal history of fall  Muscle weakness (generalized)  Rationale for Evaluation and Treatment: Rehabilitation  SUBJECTIVE:  SUBJECTIVE STATEMENT:  Pt reports he is doing well today. 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    From Eval: Pt reports to PT feeling okay. States he has been doing okay, and that he feels he can do everything he wants to do. Pt wife sates that he has had some cognitive deficits and has previously been displaying erratic behavior which friends and family corroborate. States that it has been more under control recently with updated meds. Pt presents today at the clinic for a referral of muscle wasting/atrophy.   Reports he jogs 8/10 of a mile in the evenings. Wife reports worry with this as he has no alert system to notify anyone if he falls or gets lost. She reports he witnessed him jogging and claims he does more of a hunched over fast paced walk. Wife described this situation to the SPT in a whisper so that her husband could not hear.   Pt wife reports that he often downplays or misremember's his limitations. AEB their significantly different scores on his LEFS.   Pt's son is a Systems analyst, so he states he will be able  to help out as needed with getting rehab equipment, life alert system and help him with HEP.     Pt accompanied by: significant other  PERTINENT HISTORY: From recent visit c Dr. Maree: Jesse Black is a 68 year old male with cognitive impairment and a history of stroke who presents for follow-up of memory problems.  He has not experienced significant changes in mood or memory since his last visit. He denies any stroke-like symptoms. He has a history of a fall in May 2025 without serious injuries. He has [facility] impairment post[facility]stroke versus [facility] and has seen multiple specialists. His current medications include lamotrigine , gabapentin , and olanzapine .  He experiences right-sided jaw pain radiating towards his ear, which occasionally pops when eating, though the pain is not present today. He has been taking Tylenol  and ibuprofen for this pain. A recent dental evaluation noted changes in his chewing pattern due to missing teeth, which may contribute to the jaw pain.  He has a history of generalized slow shuffling gait, hypophonia, decreased range of motion, mild bradykinesia, mild coagulant of his left hand, and left hand resting tremor. There is no family history of Parkinson's disease, and a skin biopsy for alpha-synuclein accumulation was negative.  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 Initially, there was concern for frontotemporal dementia, but a psychiatrist considered it may be related to bipolar disorder. He has shown improvement with treatment.  He is on multiple medications including rosuvastatin 20 mg, aspirin , Plavix , metformin, lisinopril , and metoprolol . He is concerned about his glucose levels and sweet eating habits and has been trying to manage his diet by avoiding sweets and opting for healthier  alternatives.  He engages in physical activity, walking about a mile daily, but there are concerns about his safety during these walks as he does not carry any alert device. He has moved to a new community with no stairs, which is beneficial for his mobility.   PAIN:  Are you having pain? No   PRECAUTIONS: None  RED FLAGS: None   WEIGHT BEARING RESTRICTIONS: No  FALLS: Has patient fallen in last 6 months? Yes. Number of falls 1 approx.  1 mo ago - at a Jet show - Head was turned up looking at a Jet flying overhead while walking and lost his balance.   LIVING ENVIRONMENT: Lives with: lives with their family and lives with their spouse Lives in: House/apartment Stairs: No Watches granddaughter on Mondays and Fridays Has following equipment at home: None   PLOF: Independent  PATIENT GOALS: Get stronger, Get back to driving (Failed driving eval twice), increase jogging distance, reduce shuffling c gait.   OBJECTIVE:  Note: Objective measures were completed at Evaluation unless otherwise noted.  DIAGNOSTIC FINDINGS: CT Head 01/24/2019: Small lacunar infarcts within the LEFT thalamus and LEFT external capsule, favored to be remote or subacute. Periventricular white matter changes consistent with small vessel disease. No evidence for acute intracranial abnormality.  MRI brain 01/24/2019: 1.2 cm acute infarction at the base of the brain on the left at the junction of the cerebral peduncle, anterior thalamus and radiating white matter tracts. Extensive old small vessel type infarctions elsewhere throughout the brain as outlined above.  MRI Brain with and without contrast 05/30/2019: No acute intracranial abnormality. Moderate chronic small vessel ischemic disease with multiple chronic lacunar infarcts as above.Unchanged chronic microhemorrhages in the left basal ganglia and pons.   COGNITION: Overall cognitive status: History of cognitive impairments - at baseline    MUSCLE TONE:  Increased tone BUE bicep cogwheel rigidity   POSTURE: forward head  LOWER EXTREMITY ROM:     Active  Right Eval Left Eval  Hip flexion [facility]  Hip extension    Hip abduction [facility]  Hip adduction [facility]  Hip internal rotation    Hip external rotation    Knee flexion [facility] WFL  Knee extension [facility]  Ankle dorsiflexion [facility] WFL  Ankle plantarflexion [facility] WFL  Ankle inversion    Ankle eversion     (Blank rows = not tested)  LOWER EXTREMITY MMT:    MMT Right Eval Left Eval  Hip flexion 4 4+   Hip extension    Hip abduction 4+ 4+  Hip adduction 5 5  Hip internal rotation    Hip external rotation    Knee flexion 5 4+  Knee extension 5 5  Ankle dorsiflexion 4+ 4+  Ankle plantarflexion    Ankle inversion    Ankle eversion    (Blank rows = not tested)  BED MOBILITY:  Not tested  TRANSFERS: Sit to stand: Complete Independence  Assistive device utilized: None      GAIT: Findings: Gait Characteristics: step through pattern, decreased arm swing- Right, decreased arm swing- Left, poor foot clearance- Right, and poor foot clearance- Left, Distance walked: 40, Level of assistance: Complete Independence, and Comments:    FUNCTIONAL TESTS:  30 seconds chair stand test = 17  PATIENT SURVEYS:  LEFS  Extreme difficulty/unable (0), Quite a bit of difficulty (1), Moderate difficulty (2), Little difficulty (3), No difficulty (4) Survey date:    Any of your usual work, housework or school activities 4  2. Usual hobbies, recreational or sporting activities 2  3. Getting into/out of the bath 4  4. Walking between rooms 4  5. Putting on socks/shoes 2  6. Squatting  4  7. Lifting an object, like a bag of groceries from the floor 3  8. Performing light activities around your home 4  9. Performing heavy activities around your home 4  10. Getting into/out of a car 3  11. Walking 2 blocks 3  12. Walking 1 mile 3  13. Going up/down 10 stairs (1 flight) 4  14. Standing for 1 hour  3  15.  sitting for 1 hour 4  16. Running on even ground 2  17. Running on uneven ground 2  18. Making sharp turns while running fast 4*  19. Hopping  4*  20. Rolling over in bed 4  Score total:  65    (*) indicates his wife was not able to confirm or deny pt's self reported score. - Above score pt wife fills out as pt is reported to not be completely aware of his deficits                                                                                                                               TREATMENT DATE: 05/27/2024    TE Leg Press - 3x8 55# first 2 sets, 70# 3rd set SPT assist to keep foot from going off plate   Calf raise on leg press machine 40#. SPT assist to get into position    Gait Training  Grocery store walking - pt walks in hallway and removes sticky notes from he wall (back and forth = 1 lap) 3 laps total, second and third laps included variable gait speed  Red light green light in Hallway - 3 laps tota. Set up red, green and yellow cones at different lengths in hallway. Instructed patient to run at a green cone, walk at yellow cone, and come to a stop at red cone.    Well-zone - Treadmill training 6 minutes - increased speed from 0.5 to 3.9 mph gradually (approx 0.5 mph every 30 sec). VC for arm swing and to run closer to the front of the treadmill  -With conversation pt gait changed and balance was more difficult.   PATIENT EDUCATION: Education details: Educated pt as to various life alert systems, reasoning for muscle testing, tone  Person educated: Patient and Spouse Education method: Explanation Education comprehension: verbalized understanding  HOME EXERCISE PROGRAM: Deferred d/t time - give next session  GOALS: Goals reviewed with patient? No  SHORT TERM GOALS: Target date: 07/08/2024    Pt will have no difficulty performing and maintaining HEP. Baseline:  Not yet given Goal status: INITIAL    LONG TERM GOALS: Target date:  07/08/2024    Pt will display no difficulty lifting a 5-10 pound object off the floor with no signs of imbalance for improved safety with functional tasks and chores Baseline: A little bit of difficulty Goal status: INITIAL  2.  Pt will increase reps during 30 second chair stand test by 3 to demonstrate improved LE power for functional activities.  Baseline: 17 Goal status: INITIAL  4.  Pt will increase his score on the LEFS by 9 points to indicate improved capacity for functional activities.  Baseline: 67 Goal status: INITIAL  5.  Pt will increase score on mini-best by 3 points in order to decrease risk for falls associated  with static and dynamic balance.  Baseline: 6/19: 22 Goal status: INITIAL  6.  Pt will be able to jog 1 mile independently with no difficulty to demonstrate ability to perform endurance and strength training as part of his personal home exercise program Baseline: a little bit of difficulty, barely a jog ( more like fast walk) per his wife   Goal status: INITIAL     ASSESSMENT:  CLINICAL IMPRESSION:  Pt presented to clinic in good spirits ready for PT. He was accompanied by his wife. Pt exhibited foot dragging on treadmill after approx. 5 minutes of jogging at speeds from 3.5-3.9 mph. Given pt tendency for going on long runs, pt would benefit from increased endurance training to reduce the risk of foot drag and falling. Additionally, pt was noted to lose his focus during jogging when SPT asked him a question and had a small stumble. Pt will benefit from dual task training during fast walking or slow jogging to reduce his risk of falls when jogging.  OBJECTIVE IMPAIRMENTS: Abnormal gait, decreased cognition, decreased knowledge of condition, decreased safety awareness, impaired perceived functional ability, and impaired tone.   ACTIVITY LIMITATIONS: carrying and lifting  PARTICIPATION LIMITATIONS: driving and community activity  PERSONAL FACTORS: Time since  onset of injury/illness/exacerbation and 1-2 comorbidities: CVA and dementia are also affecting patient's functional outcome.   REHAB POTENTIAL: Good  CLINICAL DECISION MAKING: Stable/uncomplicated  EVALUATION COMPLEXITY: Moderate  PLAN:  PT FREQUENCY: 2x/week  PT DURATION: 6 weeks  PLANNED INTERVENTIONS: 97110-Therapeutic exercises, 97530- Therapeutic activity, 97112- Neuromuscular re-education, 97535- Self Care, and [id number]- Manual therapy  PLAN FOR NEXT SESSION: give HEP, initiate LE strengthening, examine and address R shoulder if warranted, balance training to address deficits according to mini-BESTest. Blaze pod dual tasking with intermittent jogs or head turns. Educate caregiver on sundowning for dementia pts.    Casilda Human, Student-PT 06/06/2024, 1:53 PM

## 2024-06-08 ENCOUNTER — Encounter (HOSPITAL_COMMUNITY): Payer: Self-pay | Admitting: Interventional Radiology

## 2024-06-10 ENCOUNTER — Telehealth: Payer: Self-pay | Admitting: Physical Therapy

## 2024-06-10 ENCOUNTER — Ambulatory Visit: Admitting: Physical Therapy

## 2024-06-10 NOTE — Telephone Encounter (Signed)
 Pt contacted via telephone and dino spoke with family member of pt. She reported that she thought the appointment had been re-scheduled for Wednesday. Plans to have pt present for Next scheduled appointment on 06/11/24 at 2:00pm   Massie Dollar PT, DPT  Physical Therapist - [facility]  3:17 PM 06/10/24

## 2024-06-11 ENCOUNTER — Ambulatory Visit: Admitting: Physical Therapy

## 2024-06-13 ENCOUNTER — Ambulatory Visit: Admitting: Physical Therapy

## 2024-06-18 ENCOUNTER — Telehealth: Payer: Self-pay

## 2024-06-18 ENCOUNTER — Ambulatory Visit: Attending: Neurology | Admitting: Physical Therapy

## 2024-06-18 DIAGNOSIS — M6281 Muscle weakness (generalized): Secondary | ICD-10-CM | POA: Insufficient documentation

## 2024-06-18 DIAGNOSIS — R2689 Other abnormalities of gait and mobility: Secondary | ICD-10-CM | POA: Insufficient documentation

## 2024-06-18 DIAGNOSIS — R269 Unspecified abnormalities of gait and mobility: Secondary | ICD-10-CM | POA: Insufficient documentation

## 2024-06-18 DIAGNOSIS — Z9181 History of falling: Secondary | ICD-10-CM | POA: Insufficient documentation

## 2024-06-18 NOTE — Telephone Encounter (Signed)
 Contacted patient this afternoon regarding today's missed appt (06/18/24 2:00pm). Left a voicemail stating that his next appointment is scheduled for 7/10 at 2:00pm.

## 2024-06-20 ENCOUNTER — Ambulatory Visit: Admitting: Physical Therapy

## 2024-06-25 ENCOUNTER — Ambulatory Visit: Admitting: Physical Therapy

## 2024-06-27 ENCOUNTER — Ambulatory Visit: Admitting: Physical Therapy

## 2024-07-02 ENCOUNTER — Ambulatory Visit: Admitting: Physical Therapy

## 2024-07-02 ENCOUNTER — Ambulatory Visit

## 2024-07-02 DIAGNOSIS — Z9181 History of falling: Secondary | ICD-10-CM

## 2024-07-02 DIAGNOSIS — R2689 Other abnormalities of gait and mobility: Secondary | ICD-10-CM | POA: Diagnosis present

## 2024-07-02 DIAGNOSIS — M6281 Muscle weakness (generalized): Secondary | ICD-10-CM

## 2024-07-02 DIAGNOSIS — R269 Unspecified abnormalities of gait and mobility: Secondary | ICD-10-CM | POA: Diagnosis present

## 2024-07-02 NOTE — Therapy (Signed)
 OUTPATIENT PHYSICAL THERAPY NEURO TREATMENT   Patient Name: Jesse Black MRN: [id number] DOB:[birth year], 68 y.o., male Today's Date: 07/03/2024   PCP: Jesse DOROTHA Fernande DOUGLAS, MD REFERRING PROVIDER: [name], MD   END OF SESSION:  PT End of Session - 07/02/24 1147     Visit Number 4    Number of Visits 12    Date for PT Re-Evaluation 11/13/24    PT Start Time 1148    PT Stop Time 1231    PT Time Calculation (min) 43 min    Equipment Utilized During Treatment Gait belt    Activity Tolerance Patient tolerated treatment well    Behavior During Therapy WFL for tasks assessed/performed          Past Medical History:  Diagnosis Date   High blood pressure    Hyperlipidemia    Memory loss    Prediabetes    Stroke (cerebrum) (HCC)    Stroke [facility])    Past Surgical History:  Procedure Laterality Date   COLONOSCOPY WITH PROPOFOL  N/A 10/05/2015   Procedure: COLONOSCOPY WITH PROPOFOL ;  Surgeon: Jesse ONEIDA Holmes, MD;  Location: [facility] ENDOSCOPY;  Service: Endoscopy;  Laterality: N/A;   IR ANGIO INTRA EXTRACRAN SEL COM CAROTID INNOMINATE BILAT MOD SED  02/05/2019   IR ANGIO VERTEBRAL SEL SUBCLAVIAN INNOMINATE UNI R MOD SED  02/05/2019   IR ANGIO VERTEBRAL SEL VERTEBRAL UNI L MOD SED  02/05/2019   IR US  GUIDE VASC ACCESS RIGHT  02/05/2019   LOOP RECORDER INSERTION N/A 05/22/2019   Procedure: LOOP RECORDER INSERTION;  Surgeon: Jesse Blunt, MD;  Location: [facility] INVASIVE CV LAB;  Service: Cardiovascular;  Laterality: N/A;   Patient Active Problem List   Diagnosis Date Noted   CKD (chronic kidney disease), stage III (HCC) 12/25/2021   Encephalopathy 12/25/2021   COVID-19 12/25/2021   Pneumonia due to COVID-19 virus 12/24/2021   Erectile dysfunction 10/02/2019   Prediabetes 05/17/2019   OSA on CPAP 05/17/2019   Fatty liver 04/17/2019   Gallbladder polyp 04/17/2019   Insomnia 02/28/2019   Hyperlipidemia 01/31/2019   Intracranial atherosclerosis 01/31/2019   Stenosis of  right carotid artery 01/31/2019   Vitamin D  deficiency 01/31/2019   CVA (cerebral vascular accident) (HCC) 01/24/2019   Annual physical exam 07/31/2015   Hypertension 07/31/2015    ONSET DATE: Jan 25, 2019   REFERRING DIAG: M62.59 (ICD-10-CM) - Muscle wasting and atrophy, not elsewhere classified, multiple sites   THERAPY DIAG:  Unspecified abnormalities of gait and mobility  Other abnormalities of gait and mobility  Personal history of fall  Muscle weakness (generalized)  Rationale for Evaluation and Treatment: Rehabilitation  SUBJECTIVE:  SUBJECTIVE STATEMENT:  Pt reports doing okay- trying to jog some.   From Eval: Pt reports to PT feeling okay. States he has been doing okay, and that he feels he can do everything he wants to do. Pt wife sates that he has had some cognitive deficits and has previously been displaying erratic behavior which friends and family corroborate. States that it has been more under control recently with updated meds. Pt presents today at the clinic for a referral of muscle wasting/atrophy.   Reports he jogs 8/10 of a mile in the evenings. Wife reports worry with this as he has no alert system to notify anyone if he falls or gets lost. She reports he witnessed him jogging and claims he does more of a hunched over fast paced walk. Wife described this situation to the SPT in a whisper so that her husband could not hear.   Pt wife reports that he often downplays or misremember's his limitations. AEB their significantly different scores on his LEFS.   Pt's son is a Systems analyst, so he states he will be able to help out as needed with getting rehab equipment, life alert system and help him with HEP.     Pt accompanied by: significant other  PERTINENT HISTORY: From  recent visit c Dr. Maree: Jesse Black is a 68 year old male with cognitive impairment and a history of stroke who presents for follow-up of memory problems.  He has not experienced significant changes in mood or memory since his last visit. He denies any stroke-like symptoms. He has a history of a fall in May 2025 without serious injuries. He has been evaluated for cognitive impairment post-stroke versus mixed dementia at the [facility] and has seen multiple specialists. His current medications include lamotrigine , gabapentin , and olanzapine .  He experiences right-sided jaw pain radiating towards his ear, which occasionally pops when eating, though the pain is not present today. He has been taking Tylenol  and ibuprofen for this pain. A recent dental evaluation noted changes in his chewing pattern due to missing teeth, which may contribute to the jaw pain.  He has a history of generalized slow shuffling gait, hypophonia, decreased range of motion, mild bradykinesia, mild coagulant of his left hand, and left hand resting tremor. There is no family history of Parkinson's disease, and a skin biopsy for alpha-synuclein accumulation was negative.  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 Initially, there was concern for frontotemporal dementia, but a psychiatrist considered it may be related to bipolar disorder. He has shown improvement with treatment.  He is on multiple medications including rosuvastatin 20 mg, aspirin , Plavix , metformin, lisinopril , and metoprolol . He is concerned about his glucose levels and sweet eating habits and has been trying to manage his diet by avoiding sweets and opting for healthier alternatives.  He engages in physical activity, walking about a mile daily, but there are concerns about his safety during these walks as he does not carry any  alert device. He has moved to a new community with no stairs, which is beneficial for his mobility.   PAIN:  Are you having pain? No   PRECAUTIONS: None  RED FLAGS: None   WEIGHT BEARING RESTRICTIONS: No  FALLS: Has patient fallen in last 6 months? Yes. Number of falls 1 approx. 1 mo ago - at a [employer] show - Head was turned up looking at a [employer]  overhead while walking and lost his balance.   LIVING ENVIRONMENT: Lives with: lives with their family and lives with their spouse Lives in: House/apartment Stairs: No Watches granddaughter on Mondays and Fridays Has following equipment at home: None   PLOF: Independent  PATIENT GOALS: Get stronger, Get back to driving (Failed driving eval twice), increase jogging distance, reduce shuffling c gait.   OBJECTIVE:  Note: Objective measures were completed at Evaluation unless otherwise noted.  DIAGNOSTIC FINDINGS: CT Head 01/24/2019: Small lacunar infarcts within the LEFT thalamus and LEFT external capsule, favored to be remote or subacute. Periventricular white matter changes consistent with small vessel disease. No evidence for acute intracranial abnormality.  MRI brain 01/24/2019: 1.2 cm acute infarction at the base of the brain on the left at the junction of the cerebral peduncle, anterior thalamus and radiating white matter tracts. Extensive old small vessel type infarctions elsewhere throughout the brain as outlined above.  MRI Brain with and without contrast 05/30/2019: No acute intracranial abnormality. Moderate chronic small vessel ischemic disease with multiple chronic lacunar infarcts as above.Unchanged chronic microhemorrhages in the left basal ganglia and pons.   COGNITION: Overall cognitive status: History of cognitive impairments - at baseline    MUSCLE TONE: Increased tone BUE bicep cogwheel rigidity   POSTURE: forward head  LOWER EXTREMITY ROM:     Active  Right Eval Left Eval  Hip flexion [facility]  Hip extension     Hip abduction [facility]  Hip adduction [facility]  Hip internal rotation    Hip external rotation    Knee flexion [facility] WFL  Knee extension [facility]  Ankle dorsiflexion [facility] WFL  Ankle plantarflexion [facility] WFL  Ankle inversion    Ankle eversion     (Blank rows = not tested)  LOWER EXTREMITY MMT:    MMT Right Eval Left Eval  Hip flexion 4 4+   Hip extension    Hip abduction 4+ 4+  Hip adduction 5 5  Hip internal rotation    Hip external rotation    Knee flexion 5 4+  Knee extension 5 5  Ankle dorsiflexion 4+ 4+  Ankle plantarflexion    Ankle inversion    Ankle eversion    (Blank rows = not tested)  BED MOBILITY:  Not tested  TRANSFERS: Sit to stand: Complete Independence  Assistive device utilized: None      GAIT: Findings: Gait Characteristics: step through pattern, decreased arm swing- Right, decreased arm swing- Left, poor foot clearance- Right, and poor foot clearance- Left, Distance walked: 40, Level of assistance: Complete Independence, and Comments:    FUNCTIONAL TESTS:  30 seconds chair stand test = 17  PATIENT SURVEYS:  LEFS  Extreme difficulty/unable (0), Quite a bit of difficulty (1), Moderate difficulty (2), Little difficulty (3), No difficulty (4) Survey date:    Any of your usual work, housework or school activities 4  2. Usual hobbies, recreational or sporting activities 2  3. Getting into/out of the bath 4  4. Walking between rooms 4  5. Putting on socks/shoes 2  6. Squatting  4  7. Lifting an object, like a bag of groceries from the floor 3  8. Performing light activities around your home 4  9. Performing heavy activities around your home 4  10. Getting into/out of a car 3  11. Walking 2 blocks 3  12. Walking 1 mile 3  13. Going up/down 10 stairs (1 flight) 4  14. Standing for 1 hour 3  15.  sitting for 1 hour 4  16. Running on even ground 2  17. Running on uneven ground 2  18. Making sharp turns while running fast 4*  19. Hopping  4*  20.  Rolling over in bed 4  Score total:  6    (*) indicates his wife was not able to confirm or deny pt's self reported score. - Above score pt wife fills out as pt is reported to not be completely aware of his deficits                                                                                                                               TREATMENT DATE: 07/02/2024   Therapeutic Activities: dynamic therapeutic activities designed to achieve improved functional performance    Step tap alt LE onto 6 step- x 2 min approx 50 steps (counted every R step) wearing 5# AW Step up alt LE onto 6 step with 5# AW x 20 reps each  Sit to stand (holding onto 5kg ball) x 12 reps (VC to perform slow eccentric control)    Neuromuscular Re-Education: neuromuscular reeducation of movement, balance, coordination, kinesthetic sense, posture and proprioception for sitting and/or standing   -Activity Description: At dry erase board- standing- 3 pods positioned near bar and 3 on dry board- 2 colors (instructed to tap pink with left side (either UE/LE depending on pod location) and tap blue with Right side of body.  Activity Setting:  The Blaze Pod Random setting was chosen to enhance cognitive processing and agility, providing an unpredictable environment to simulate real-world scenarios, and fostering quick reactions and adaptability.   Number of Pods:  6 Cycles/Sets:  10 Duration (Time or Hit Count):  30 sec Patient Stats: Hits:   varied between 9-16  Activity Description: 6 pods positioned on floor to focus on fwd/side/bwd stepping Activity Setting:  The Blaze Pod Random setting was chosen to enhance cognitive processing and agility, providing an unpredictable environment to simulate real-world scenarios, and fostering quick reactions and adaptability.   Number of Pods:  6 Cycles/Sets:  10 Duration (Time or Hit Count):  30 sec  Patient Stats Hits:   varied from 10-17    PATIENT EDUCATION: Education  details: Educated pt as to various life alert systems, reasoning for muscle testing, tone  Person educated: Patient and Spouse Education method: Explanation Education comprehension: verbalized understanding  HOME EXERCISE PROGRAM: Deferred d/t time - give next session  GOALS: Goals reviewed with patient? No  SHORT TERM GOALS: Target date: 07/08/2024    Pt will have no difficulty performing and maintaining HEP. Baseline:  Not yet given Goal status: INITIAL    LONG TERM GOALS: Target date: 07/08/2024    Pt will display no difficulty lifting a 5-10 pound object off the floor with no signs of imbalance for improved safety with functional tasks and chores Baseline: A little bit of difficulty Goal status: INITIAL  2.  Pt will increase  reps during 30 second chair stand test by 3 to demonstrate improved LE power for functional activities.  Baseline: 17 Goal status: INITIAL  4.  Pt will increase his score on the LEFS by 9 points to indicate improved capacity for functional activities.  Baseline: 67 Goal status: INITIAL  5.  Pt will increase score on mini-best by 3 points in order to decrease risk for falls associated with static and dynamic balance.  Baseline: 6/19: 22 Goal status: INITIAL  6.  Pt will be able to jog 1 mile independently with no difficulty to demonstrate ability to perform endurance and strength training as part of his personal home exercise program Baseline: a little bit of difficulty, barely a jog ( more like fast walk) per his wife   Goal status: INITIAL     ASSESSMENT:  CLINICAL IMPRESSION:  Treatment continued with plan to incorporate blaze pods activity and patient performed well for majority of session engaging in dual tasks activities. Some imbalance and reminders to perform balance activities slower. He was able to follow all directions today including how to use blaze pods and all step activities. He will continue to benefit from dual task  activities. Pt will benefit from dual task training during fast walking or slow jogging to reduce his risk of falls when jogging.  OBJECTIVE IMPAIRMENTS: Abnormal gait, decreased cognition, decreased knowledge of condition, decreased safety awareness, impaired perceived functional ability, and impaired tone.   ACTIVITY LIMITATIONS: carrying and lifting  PARTICIPATION LIMITATIONS: driving and community activity  PERSONAL FACTORS: Time since onset of injury/illness/exacerbation and 1-2 comorbidities: CVA and dementia are also affecting patient's functional outcome.   REHAB POTENTIAL: Good  CLINICAL DECISION MAKING: Stable/uncomplicated  EVALUATION COMPLEXITY: Moderate  PLAN:  PT FREQUENCY: 2x/week  PT DURATION: 6 weeks  PLANNED INTERVENTIONS: 97110-Therapeutic exercises, 97530- Therapeutic activity, [id number]- Neuromuscular re-education, 97535- Self Care, and [id number]- Manual therapy  PLAN FOR NEXT SESSION:  FOCUS ON IMPLEMENTING and issuing HEP Observed patient jog and ensure safety- implement safety/gait/run safety Continue with LE strengthening examine and address R shoulder if warranted Balance training to address deficits according to mini-BESTest. Continue with  Blaze pod dual tasking with intermittent jogs or head turns. Educate caregiver on sundowning for dementia care as appropriate.    Reyes LOISE London, PT 07/03/2024, 1:44 PM

## 2024-07-04 ENCOUNTER — Encounter: Payer: Self-pay | Admitting: Physical Therapy

## 2024-07-04 ENCOUNTER — Ambulatory Visit: Admitting: Physical Therapy

## 2024-07-04 DIAGNOSIS — M6281 Muscle weakness (generalized): Secondary | ICD-10-CM

## 2024-07-04 DIAGNOSIS — R269 Unspecified abnormalities of gait and mobility: Secondary | ICD-10-CM | POA: Diagnosis not present

## 2024-07-04 DIAGNOSIS — Z9181 History of falling: Secondary | ICD-10-CM

## 2024-07-04 DIAGNOSIS — R2689 Other abnormalities of gait and mobility: Secondary | ICD-10-CM

## 2024-07-04 NOTE — Therapy (Unsigned)
 OUTPATIENT PHYSICAL THERAPY NEURO TREATMENT   Patient Name: Jesse Black MRN: [id number] DOB:09-Jul-1956, 68 y.o., male Today's Date: 07/05/2024   PCP: Jesse DOROTHA Fernande DOUGLAS, MD REFERRING PROVIDER: [name], MD   END OF SESSION:  PT End of Session - 07/04/24 1434     Visit Number 5    Number of Visits 12    Date for PT Re-Evaluation 11/13/24    PT Start Time 1445 (P)     PT Stop Time 1530 (P)     PT Time Calculation (min) 45 min (P)     Equipment Utilized During Treatment Gait belt (P)     Activity Tolerance Patient tolerated treatment well (P)     Behavior During Therapy WFL for tasks assessed/performed (P)           Past Medical History:  Diagnosis Date   High blood pressure    Hyperlipidemia    Memory loss    Prediabetes    Stroke (cerebrum) (HCC)    Stroke [facility])    Past Surgical History:  Procedure Laterality Date   COLONOSCOPY WITH PROPOFOL  N/A 10/05/2015   Procedure: COLONOSCOPY WITH PROPOFOL ;  Surgeon: Jesse ONEIDA Holmes, MD;  Location: [facility] ENDOSCOPY;  Service: Endoscopy;  Laterality: N/A;   IR ANGIO INTRA EXTRACRAN SEL COM CAROTID INNOMINATE BILAT MOD SED  02/05/2019   IR ANGIO VERTEBRAL SEL SUBCLAVIAN INNOMINATE UNI R MOD SED  02/05/2019   IR ANGIO VERTEBRAL SEL VERTEBRAL UNI L MOD SED  02/05/2019   IR US  GUIDE VASC ACCESS RIGHT  02/05/2019   LOOP RECORDER INSERTION N/A 05/22/2019   Procedure: LOOP RECORDER INSERTION;  Surgeon: Jesse Blunt, MD;  Location: [facility] INVASIVE CV LAB;  Service: Cardiovascular;  Laterality: N/A;   Patient Active Problem List   Diagnosis Date Noted   CKD (chronic kidney disease), stage III (HCC) 12/25/2021   Encephalopathy 12/25/2021   COVID-19 12/25/2021   Pneumonia due to COVID-19 virus 12/24/2021   Erectile dysfunction 10/02/2019   Prediabetes 05/17/2019   OSA on CPAP 05/17/2019   Fatty liver 04/17/2019   Gallbladder polyp 04/17/2019   Insomnia 02/28/2019   Hyperlipidemia 01/31/2019   Intracranial  atherosclerosis 01/31/2019   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   Vitamin D  deficiency 01/31/2019   CVA (cerebral vascular accident) (HCC) 01/24/2019   Annual physical exam 07/31/2015   Hypertension 07/31/2015    ONSET DATE: Jan 25, 2019   REFERRING DIAG: M62.59 (ICD-10-CM) - Muscle wasting and atrophy, not elsewhere classified, multiple sites   THERAPY DIAG:  Unspecified abnormalities of gait and mobility  Other abnormalities of gait and mobility  Muscle weakness (generalized)  Personal history of fall  Rationale for Evaluation and Treatment: Rehabilitation  SUBJECTIVE:  SUBJECTIVE STATEMENT:  Pt reports doing okay, apologizes for missing last few visits.   From Eval: Pt reports to PT feeling okay. States he has been doing okay, and that he feels he can do everything he wants to do. Pt wife sates that he has had some cognitive deficits and has previously been displaying erratic behavior which friends and family corroborate. States that it has been more under control recently with updated meds. Pt presents today at the clinic for a referral of muscle wasting/atrophy.   Reports he jogs 8/10 of a mile in the evenings. Wife reports worry with this as he has no alert system to notify anyone if he falls or gets lost. She reports he witnessed him jogging and claims he does more of a hunched over fast paced walk. Wife described this situation to the SPT in a whisper so that her husband could not hear.   Pt wife reports that he often downplays or misremember's his limitations. AEB their significantly different scores on his LEFS.   Pt's son is a Systems analyst, so he states he will be able to help out as needed with getting rehab equipment, life alert system and help him with HEP.     Pt  accompanied by: significant other  PERTINENT HISTORY: From recent visit c Dr. Maree: Jesse Black is a 68 year old male with cognitive impairment and a history of stroke who presents for follow-up of memory problems.  He has not experienced significant changes in mood or memory since his last visit. He denies any stroke-like symptoms. He has a history of a fall in May 2025 without serious injuries. He has been evaluated for cognitive impairment post-stroke versus mixed dementia at the [facility] and has seen multiple specialists. His current medications include lamotrigine , gabapentin , and olanzapine .  He experiences right-sided jaw pain radiating towards his ear, which occasionally pops when eating, though the pain is not present today. He has been taking Tylenol  and ibuprofen for this pain. A recent dental evaluation noted changes in his chewing pattern due to missing teeth, which may contribute to the jaw pain.  He has a history of generalized slow shuffling gait, hypophonia, decreased range of motion, mild bradykinesia, mild coagulant of his left hand, and left hand resting tremor. There is no family history of Parkinson's disease, and a skin biopsy for alpha-synuclein accumulation was negative.  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 Initially, there was concern for frontotemporal dementia, but a psychiatrist considered it may be related to bipolar disorder. He has shown improvement with treatment.  He is on multiple medications including rosuvastatin 20 mg, aspirin , Plavix , metformin, lisinopril , and metoprolol . He is concerned about his glucose levels and sweet eating habits and has been trying to manage his diet by avoiding sweets and opting for healthier alternatives.  He engages in physical activity, walking about a mile daily, but there are concerns  about his safety during these walks as he does not carry any alert device. He has moved to a new community with no stairs, which is beneficial for his mobility.   PAIN:  Are you having pain? No   PRECAUTIONS: None  RED FLAGS: None   WEIGHT BEARING RESTRICTIONS: No  FALLS: Has patient fallen in last 6 months? Yes. Number of falls 1 approx. 1 mo ago - at a Jet show - Head was turned up looking at a  Jet flying overhead while walking and lost his balance.   LIVING ENVIRONMENT: Lives with: lives with their family and lives with their spouse Lives in: House/apartment Stairs: No Watches granddaughter on Mondays and Fridays Has following equipment at home: None   PLOF: Independent  PATIENT GOALS: Get stronger, Get back to driving (Failed driving eval twice), increase jogging distance, reduce shuffling c gait.   OBJECTIVE:  Note: Objective measures were completed at Evaluation unless otherwise noted.  DIAGNOSTIC FINDINGS: CT Head 01/24/2019: Small lacunar infarcts within the LEFT thalamus and LEFT external capsule, favored to be remote or subacute. Periventricular white matter changes consistent with small vessel disease. No evidence for acute intracranial abnormality.  MRI brain 01/24/2019: 1.2 cm acute infarction at the base of the brain on the left at the junction of the cerebral peduncle, anterior thalamus and radiating white matter tracts. Extensive old small vessel type infarctions elsewhere throughout the brain as outlined above.  MRI Brain with and without contrast 05/30/2019: No acute intracranial abnormality. Moderate chronic small vessel ischemic disease with multiple chronic lacunar infarcts as above.Unchanged chronic microhemorrhages in the left basal ganglia and pons.   COGNITION: Overall cognitive status: History of cognitive impairments - at baseline    MUSCLE TONE: Increased tone BUE bicep cogwheel rigidity   POSTURE: forward head  LOWER EXTREMITY ROM:     Active   Right Eval Left Eval  Hip flexion [facility]  Hip extension    Hip abduction [facility]  Hip adduction [facility]  Hip internal rotation    Hip external rotation    Knee flexion [facility] WFL  Knee extension [facility]  Ankle dorsiflexion [facility] WFL  Ankle plantarflexion [facility] WFL  Ankle inversion    Ankle eversion     (Blank rows = not tested)  LOWER EXTREMITY MMT:    MMT Right Eval Left Eval  Hip flexion 4 4+   Hip extension    Hip abduction 4+ 4+  Hip adduction 5 5  Hip internal rotation    Hip external rotation    Knee flexion 5 4+  Knee extension 5 5  Ankle dorsiflexion 4+ 4+  Ankle plantarflexion    Ankle inversion    Ankle eversion    (Blank rows = not tested)  BED MOBILITY:  Not tested  TRANSFERS: Sit to stand: Complete Independence  Assistive device utilized: None      GAIT: Findings: Gait Characteristics: step through pattern, decreased arm swing- Right, decreased arm swing- Left, poor foot clearance- Right, and poor foot clearance- Left, Distance walked: 40, Level of assistance: Complete Independence, and Comments:    FUNCTIONAL TESTS:  30 seconds chair stand test = 17  PATIENT SURVEYS:  LEFS  Extreme difficulty/unable (0), Quite a bit of difficulty (1), Moderate difficulty (2), Little difficulty (3), No difficulty (4) Survey date:    Any of your usual work, housework or school activities 4  2. Usual hobbies, recreational or sporting activities 2  3. Getting into/out of the bath 4  4. Walking between rooms 4  5. Putting on socks/shoes 2  6. Squatting  4  7. Lifting an object, like a bag of groceries from the floor 3  8. Performing light activities around your home 4  9. Performing heavy activities around your home 4  10. Getting into/out of a car 3  11. Walking 2 blocks 3  12. Walking 1 mile 3  13. Going up/down 10 stairs (1 flight) 4  14. Standing for 1 hour 3  15.  sitting for 1 hour 4  16. Running on even ground 2  17. Running on uneven ground 2  18. Making  sharp turns while running fast 4*  19. Hopping  4*  20. Rolling over in bed 4  Score total:  42    (*) indicates his wife was not able to confirm or deny pt's self reported score. - Above score pt wife fills out as pt is reported to not be completely aware of his deficits                                                                                                                               TREATMENT DATE: 07/02/2024  Unless otherwise stated, CGA was provided and gait belt donned in order to ensure pt safety    Therapeutic Activities: dynamic therapeutic activities designed to achieve improved functional performance    Step tap alt LE onto 6 step- 2x 1 min approx 50 , 1st set no dual tasking, second set dual tasking via magnets on whiteboard   Step up alt LE onto 6 step 2x30, 1st set no dual tasking, 2nd set dual tasking via magnets on whiteboard  Sit to stand c cross body punches 2x15 reps  (VC to perform slow eccentric control)   TE- To improve strength, endurance, mobility, and function of specific targeted muscle groups or improve joint range of motion or improve muscle flexibility   RDL c 2 9# dumbbells - place dumbbell on green chair - vc for keeping back straight, squeezing glutes and extension through finish.   (Verbally added to HEP 3x10 3-5/week)    Neuromuscular Re-Education: neuromuscular reeducation of movement, balance, coordination, kinesthetic sense, posture and proprioception for sitting and/or standing   Dual task Treadmill training - 10 min total, ramp up speed gradually to 3.3 mph, asked pt to identify colors, spell out words, do simple math, associate a color with a college (red= [place]).    [employer] TM- gait trainer using UE support and gait belt-   Step length= _0.68_ m RLE__0.67 m L  Time on each foot _50_%R/_50_%L  Gait velocity= 1.29 m/s  Total Time on TM =  10 min  PATIENT EDUCATION: Education details: Educated pt as to various life alert  systems, reasoning for muscle testing, tone  Person educated: Patient and Spouse Education method: Explanation Education comprehension: verbalized understanding  HOME EXERCISE PROGRAM: Deferred d/t time - give next session  GOALS: Goals reviewed with patient? No  SHORT TERM GOALS: Target date: 07/08/2024    Pt will have no difficulty performing and maintaining HEP. Baseline:  Not yet given Goal status: INITIAL    LONG TERM GOALS: Target date: 07/08/2024    Pt will display no difficulty lifting a 5-10 pound object off the floor with no signs of imbalance for improved safety with functional tasks and chores Baseline: A little bit of difficulty Goal status: INITIAL  2.  Pt will  increase reps during 30 second chair stand test by 3 to demonstrate improved LE power for functional activities.  Baseline: 17 Goal status: INITIAL  4.  Pt will increase his score on the LEFS by 9 points to indicate improved capacity for functional activities.  Baseline: 67 Goal status: INITIAL  5.  Pt will increase score on mini-best by 3 points in order to decrease risk for falls associated with static and dynamic balance.  Baseline: 6/19: 22 Goal status: INITIAL  6.  Pt will be able to jog 1 mile independently with no difficulty to demonstrate ability to perform endurance and strength training as part of his personal home exercise program Baseline: a little bit of difficulty, barely a jog ( more like fast walk) per his wife   Goal status: INITIAL     ASSESSMENT:  CLINICAL IMPRESSION:  Treatment continued with plan to incorporate dual tasking activities to pt program in order to improve overall community function. Pt performed step training while dual tasking today with noted increase in difficulty maintaining step rhythm c dual tasking. On treadmill, pt displays good gait without dual tasking, but can veer to L side when asked to dual tasks and had 1 small stumble which he was able to correct  with right reactions and support from PT. Pt would benefit from additional dual tasking activities during ambulation to further increase function. Pt will continue to benefit from skilled physical therapy intervention to address impairments, improve QOL, and attain therapy goals.     OBJECTIVE IMPAIRMENTS: Abnormal gait, decreased cognition, decreased knowledge of condition, decreased safety awareness, impaired perceived functional ability, and impaired tone.   ACTIVITY LIMITATIONS: carrying and lifting  PARTICIPATION LIMITATIONS: driving and community activity  PERSONAL FACTORS: Time since onset of injury/illness/exacerbation and 1-2 comorbidities: CVA and dementia are also affecting patient's functional outcome.   REHAB POTENTIAL: Good  CLINICAL DECISION MAKING: Stable/uncomplicated  EVALUATION COMPLEXITY: Moderate  PLAN:  PT FREQUENCY: 2x/week  PT DURATION: 6 weeks  PLANNED INTERVENTIONS: 97110-Therapeutic exercises, 97530- Therapeutic activity, [id number]- Neuromuscular re-education, 97535- Self Care, and [id number]- Manual therapy  PLAN FOR NEXT SESSION:  FOCUS ON IMPLEMENTING and issuing HEP Observed patient jog and ensure safety- implement safety/gait/run safety Continue with LE strengthening examine and address R shoulder if warranted Balance training to address deficits according to mini-BESTest. Continue with  Blaze pod dual tasking with intermittent jogs or head turns. Educate caregiver on sundowning for dementia care as appropriate.    Lonni KATHEE Gainer, PT 07/05/2024, 8:49 AM

## 2024-07-09 ENCOUNTER — Ambulatory Visit: Admitting: Physical Therapy

## 2024-07-11 ENCOUNTER — Ambulatory Visit: Admitting: Physical Therapy

## 2024-07-16 ENCOUNTER — Telehealth: Payer: Self-pay

## 2024-07-16 ENCOUNTER — Ambulatory Visit: Attending: Neurology | Admitting: Physical Therapy

## 2024-07-16 ENCOUNTER — Ambulatory Visit: Admitting: Physical Therapy

## 2024-07-16 DIAGNOSIS — M6281 Muscle weakness (generalized): Secondary | ICD-10-CM | POA: Insufficient documentation

## 2024-07-16 DIAGNOSIS — R2689 Other abnormalities of gait and mobility: Secondary | ICD-10-CM | POA: Insufficient documentation

## 2024-07-16 DIAGNOSIS — R269 Unspecified abnormalities of gait and mobility: Secondary | ICD-10-CM | POA: Insufficient documentation

## 2024-07-16 DIAGNOSIS — Z9181 History of falling: Secondary | ICD-10-CM | POA: Insufficient documentation

## 2024-07-16 NOTE — Telephone Encounter (Signed)
 PT reached out to pt & pt's guardian (spouse) via secure phone line due to missed visit/no-show today for PT appointment with Lonni Gainer, PT. Pt's spouse reports they forgot about today's visit. PT provided information regarding next visit date/time.  Darryle Patten PT, DPT

## 2024-07-18 ENCOUNTER — Ambulatory Visit

## 2024-07-18 ENCOUNTER — Encounter: Payer: Self-pay | Admitting: Physical Therapy

## 2024-07-18 DIAGNOSIS — M6281 Muscle weakness (generalized): Secondary | ICD-10-CM | POA: Diagnosis present

## 2024-07-18 DIAGNOSIS — Z9181 History of falling: Secondary | ICD-10-CM

## 2024-07-18 DIAGNOSIS — R2689 Other abnormalities of gait and mobility: Secondary | ICD-10-CM

## 2024-07-18 DIAGNOSIS — R269 Unspecified abnormalities of gait and mobility: Secondary | ICD-10-CM | POA: Diagnosis present

## 2024-07-18 NOTE — Therapy (Signed)
 OUTPATIENT PHYSICAL THERAPY NEURO TREATMENT   Patient Name: Jesse Black MRN: [id number] DOB:1956-05-17, 68 y.o., male Today's Date: 07/18/2024   PCP: Jesse DOROTHA Fernande DOUGLAS, MD REFERRING PROVIDER: [name], MD   END OF SESSION:  PT End of Session - 07/18/24 1308     Visit Number 6    Number of Visits 12    Date for PT Re-Evaluation 11/13/24    PT Start Time 1315    PT Stop Time 1355    PT Time Calculation (min) 40 min    Equipment Utilized During Treatment Gait belt    Activity Tolerance Patient tolerated treatment well    Behavior During Therapy WFL for tasks assessed/performed          Past Medical History:  Diagnosis Date   High blood pressure    Hyperlipidemia    Memory loss    Prediabetes    Stroke (cerebrum) (HCC)    Stroke [facility])    Past Surgical History:  Procedure Laterality Date   COLONOSCOPY WITH PROPOFOL  N/A 10/05/2015   Procedure: COLONOSCOPY WITH PROPOFOL ;  Surgeon: Jesse ONEIDA Holmes, MD;  Location: [facility] ENDOSCOPY;  Service: Endoscopy;  Laterality: N/A;   IR ANGIO INTRA EXTRACRAN SEL COM CAROTID INNOMINATE BILAT MOD SED  02/05/2019   IR ANGIO VERTEBRAL SEL SUBCLAVIAN INNOMINATE UNI R MOD SED  02/05/2019   IR ANGIO VERTEBRAL SEL VERTEBRAL UNI L MOD SED  02/05/2019   IR US  GUIDE VASC ACCESS RIGHT  02/05/2019   LOOP RECORDER INSERTION N/A 05/22/2019   Procedure: LOOP RECORDER INSERTION;  Surgeon: Jesse Blunt, MD;  Location: [facility] INVASIVE CV LAB;  Service: Cardiovascular;  Laterality: N/A;   Patient Active Problem List   Diagnosis Date Noted   CKD (chronic kidney disease), stage III (HCC) 12/25/2021   Encephalopathy 12/25/2021   COVID-19 12/25/2021   Pneumonia due to COVID-19 virus 12/24/2021   Erectile dysfunction 10/02/2019   Prediabetes 05/17/2019   OSA on CPAP 05/17/2019   Fatty liver 04/17/2019   Gallbladder polyp 04/17/2019   Insomnia 02/28/2019   Hyperlipidemia 01/31/2019   Intracranial atherosclerosis 01/31/2019   Stenosis of  right carotid artery 01/31/2019   Vitamin D  deficiency 01/31/2019   CVA (cerebral vascular accident) (HCC) 01/24/2019   Annual physical exam 07/31/2015   Hypertension 07/31/2015    ONSET DATE: Jan 25, 2019   REFERRING DIAG: M62.59 (ICD-10-CM) - Muscle wasting and atrophy, not elsewhere classified, multiple sites   THERAPY DIAG:  Unspecified abnormalities of gait and mobility  Other abnormalities of gait and mobility  Muscle weakness (generalized)  Personal history of fall  Rationale for Evaluation and Treatment: Rehabilitation  SUBJECTIVE:  SUBJECTIVE STATEMENT:  Pt reports overall doing well. No concerns or new updates. Pt and spouse think he is close to d/c. Wanting to maybe after next week. Want updated strength exercises as they feel this is his biggest limiter.     From Eval: Pt reports to PT feeling okay. States he has been doing okay, and that he feels he can do everything he wants to do. Pt wife sates that he has had some cognitive deficits and has previously been displaying erratic behavior which friends and family corroborate. States that it has been more under control recently with updated meds. Pt presents today at the clinic for a referral of muscle wasting/atrophy.   Reports he jogs 8/10 of a mile in the evenings. Wife reports worry with this as he has no alert system to notify anyone if he falls or gets lost. She reports he witnessed him jogging and claims he does more of a hunched over fast paced walk. Wife described this situation to the SPT in a whisper so that her husband could not hear.   Pt wife reports that he often downplays or misremember's his limitations. AEB their significantly different scores on his LEFS.   Pt's son is a Systems analyst, so he states he will be able  to help out as needed with getting rehab equipment, life alert system and help him with HEP.     Pt accompanied by: significant other  PERTINENT HISTORY: From recent visit c Dr. Maree: Jesse Black is a 68 year old male with cognitive impairment and a history of stroke who presents for follow-up of memory problems.  He has not experienced significant changes in mood or memory since his last visit. He denies any stroke-like symptoms. He has a history of a fall in May 2025 without serious injuries. He has been evaluated for cognitive impairment post-stroke versus mixed dementia at the [facility] and has seen multiple specialists. His current medications include lamotrigine , gabapentin , and olanzapine .  He experiences right-sided jaw pain radiating towards his ear, which occasionally pops when eating, though the pain is not present today. He has been taking Tylenol  and ibuprofen for this pain. A recent dental evaluation noted changes in his chewing pattern due to missing teeth, which may contribute to the jaw pain.  He has a history of generalized slow shuffling gait, hypophonia, decreased range of motion, mild bradykinesia, mild coagulant of his left hand, and left hand resting tremor. There is no family history of Parkinson's disease, and a skin biopsy for alpha-synuclein accumulation was negative.  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 Initially, there was concern for frontotemporal dementia, but a psychiatrist considered it may be related to bipolar disorder. He has shown improvement with treatment.  He is on multiple medications including rosuvastatin 20 mg, aspirin , Plavix , metformin, lisinopril , and metoprolol . He is concerned about his glucose levels and sweet eating habits and has been trying to manage his diet by avoiding sweets and opting for healthier  alternatives.  He engages in physical activity, walking about a mile daily, but there are concerns about his safety during these walks as he does not carry any alert device. He has moved to a new community with no stairs, which is beneficial for his mobility.   PAIN:  Are you having pain? No   PRECAUTIONS: None  RED FLAGS: None   WEIGHT BEARING RESTRICTIONS: No  FALLS:  Has patient fallen in last 6 months? Yes. Number of falls 1 approx. 1 mo ago - at a Jet show - Head was turned up looking at a Jet flying overhead while walking and lost his balance.   LIVING ENVIRONMENT: Lives with: lives with their family and lives with their spouse Lives in: House/apartment Stairs: No Watches granddaughter on Mondays and Fridays Has following equipment at home: None   PLOF: Independent  PATIENT GOALS: Get stronger, Get back to driving (Failed driving eval twice), increase jogging distance, reduce shuffling c gait.   OBJECTIVE:  Note: Objective measures were completed at Evaluation unless otherwise noted.  DIAGNOSTIC FINDINGS: CT Head 01/24/2019: Small lacunar infarcts within the LEFT thalamus and LEFT external capsule, favored to be remote or subacute. Periventricular white matter changes consistent with small vessel disease. No evidence for acute intracranial abnormality.  MRI brain 01/24/2019: 1.2 cm acute infarction at the base of the brain on the left at the junction of the cerebral peduncle, anterior thalamus and radiating white matter tracts. Extensive old small vessel type infarctions elsewhere throughout the brain as outlined above.  MRI Brain with and without contrast 05/30/2019: No acute intracranial abnormality. Moderate chronic small vessel ischemic disease with multiple chronic lacunar infarcts as above.Unchanged chronic microhemorrhages in the left basal ganglia and pons.   COGNITION: Overall cognitive status: History of cognitive impairments - at baseline    MUSCLE TONE:  Increased tone BUE bicep cogwheel rigidity   POSTURE: forward head  LOWER EXTREMITY ROM:     Active  Right Eval Left Eval  Hip flexion [facility]  Hip extension    Hip abduction [facility]  Hip adduction [facility]  Hip internal rotation    Hip external rotation    Knee flexion [facility] WFL  Knee extension [facility]  Ankle dorsiflexion [facility] WFL  Ankle plantarflexion [facility] WFL  Ankle inversion    Ankle eversion     (Blank rows = not tested)  LOWER EXTREMITY MMT:    MMT Right Eval Left Eval  Hip flexion 4 4+   Hip extension    Hip abduction 4+ 4+  Hip adduction 5 5  Hip internal rotation    Hip external rotation    Knee flexion 5 4+  Knee extension 5 5  Ankle dorsiflexion 4+ 4+  Ankle plantarflexion    Ankle inversion    Ankle eversion    (Blank rows = not tested)  BED MOBILITY:  Not tested  TRANSFERS: Sit to stand: Complete Independence  Assistive device utilized: None      GAIT: Findings: Gait Characteristics: step through pattern, decreased arm swing- Right, decreased arm swing- Left, poor foot clearance- Right, and poor foot clearance- Left, Distance walked: 40, Level of assistance: Complete Independence, and Comments:    FUNCTIONAL TESTS:  30 seconds chair stand test = 17  PATIENT SURVEYS:  LEFS  Extreme difficulty/unable (0), Quite a bit of difficulty (1), Moderate difficulty (2), Little difficulty (3), No difficulty (4) Survey date:    Any of your usual work, housework or school activities 4  2. Usual hobbies, recreational or sporting activities 2  3. Getting into/out of the bath 4  4. Walking between rooms 4  5. Putting on socks/shoes 2  6. Squatting  4  7. Lifting an object, like a bag of groceries from the floor 3  8. Performing light activities around your home 4  9. Performing heavy activities around your home 4  10. Getting into/out of a car  3  11. Walking 2 blocks 3  12. Walking 1 mile 3  13. Going up/down 10 stairs (1 flight) 4  14. Standing for 1 hour  3  15.  sitting for 1 hour 4  16. Running on even ground 2  17. Running on uneven ground 2  18. Making sharp turns while running fast 4*  19. Hopping  4*  20. Rolling over in bed 4  Score total:  63    (*) indicates his wife was not able to confirm or deny pt's self reported score. - Above score pt wife fills out as pt is reported to not be completely aware of his deficits                                                                                                                               TREATMENT DATE: 07/18/2024    Therapeutic Activities: dynamic therapeutic activities designed to achieve improved functional performance   STS: x8. Minimal anterior weight shift. X8 with TC's for anterior weight shift. X8 holding 5 KG med ball   Lunge alternating with SUE support: 2x8/side. Min VC's for form/technique.   Squats with BUE support: 2x8, use of chair for TC for depth  SLS: 2x30 sec/LE, SBA next to bar for support if needed.    Self care/ Home mgmt:  Time spent discussing possible d/c next week. Importance of HEP compliance at home. Provided updated HEP reviewing reps/sets/frequency.     PATIENT EDUCATION: Education details: Educated pt as to various life alert systems, reasoning for muscle testing, tone  Person educated: Patient and Spouse Education method: Explanation Education comprehension: verbalized understanding  HOME EXERCISE PROGRAM: Access Code: [email or web address] URL: [email or web address] Date: 07/18/2024 Prepared by: Dorina Kingfisher  Exercises - Standard Lunge  - 1 x daily - 3 x weekly - 2 sets - 8 reps - Squat  - 1 x daily - 3 x weekly - 3 sets - 8 reps - Single Leg Stance  - 1 x daily - 7 x weekly - 1 sets - 3 reps - 30 hold  GOALS: Goals reviewed with patient? No  SHORT TERM GOALS: Target date: 07/08/2024    Pt will have no difficulty performing and maintaining HEP. Baseline:  Not yet given Goal status: INITIAL    LONG TERM GOALS:  Target date: 07/08/2024    Pt will display no difficulty lifting a 5-10 pound object off the floor with no signs of imbalance for improved safety with functional tasks and chores Baseline: A little bit of difficulty Goal status: INITIAL  2.  Pt will increase reps during 30 second chair stand test by 3 to demonstrate improved LE power for functional activities.  Baseline: 17 Goal status: INITIAL  4.  Pt will increase his score on the LEFS by 9 points to indicate improved capacity for functional activities.  Baseline: 67 Goal status: INITIAL  5.  Pt will increase score on mini-best by 3  points in order to decrease risk for falls associated with static and dynamic balance.  Baseline: 6/19: 22 Goal status: INITIAL  6.  Pt will be able to jog 1 mile independently with no difficulty to demonstrate ability to perform endurance and strength training as part of his personal home exercise program Baseline: a little bit of difficulty, barely a jog ( more like fast walk) per his wife   Goal status: INITIAL     ASSESSMENT:  CLINICAL IMPRESSION: Focus of session on HEP updates. Pt and spouse verbalizing possible discharge after next week sessions. Time spent updating HEP with progressive strengthening exercises. Encouraged pt and spouse to f/u next session and report tolerance for updated HEP and will modify reps/sets/frequency as needed. Pt and spouse understanding. Pt will continue to benefit from skilled physical therapy intervention to address impairments, improve QOL, and attain therapy goals.     OBJECTIVE IMPAIRMENTS: Abnormal gait, decreased cognition, decreased knowledge of condition, decreased safety awareness, impaired perceived functional ability, and impaired tone.   ACTIVITY LIMITATIONS: carrying and lifting  PARTICIPATION LIMITATIONS: driving and community activity  PERSONAL FACTORS: Time since onset of injury/illness/exacerbation and 1-2 comorbidities: CVA and dementia are also  affecting patient's functional outcome.   REHAB POTENTIAL: Good  CLINICAL DECISION MAKING: Stable/uncomplicated  EVALUATION COMPLEXITY: Moderate  PLAN:  PT FREQUENCY: 2x/week  PT DURATION: 6 weeks  PLANNED INTERVENTIONS: 97110-Therapeutic exercises, 97530- Therapeutic activity, [id number]- Neuromuscular re-education, 97535- Self Care, and [id number]- Manual therapy  PLAN FOR NEXT SESSION:  FOCUS ON IMPLEMENTING and issuing HEP Observed patient jog and ensure safety- implement safety/gait/run safety Continue with LE strengthening examine and address R shoulder if warranted Balance training to address deficits according to mini-BESTest. Continue with  Blaze pod dual tasking with intermittent jogs or head turns. Educate caregiver on sundowning for dementia care as appropriate.    Dorina HERO. Black IV, PT, DPT Physical Therapist- [place]  [facility] 07/18/2024, 3:09 PM

## 2024-07-19 ENCOUNTER — Ambulatory Visit: Admitting: Physical Therapy

## 2024-07-22 ENCOUNTER — Ambulatory Visit

## 2024-07-22 ENCOUNTER — Telehealth: Payer: Self-pay

## 2024-07-22 NOTE — Telephone Encounter (Signed)
 Patient Name: Garin Mata MRN: [id number] DOB:1956-05-29, 68 y.o., male Today's Date: 07/22/2024  Pt contacted via telephone and author left voice mail informing of missed appointment and informed pt of future PT appointment date and time.      Reyes LOISE London, PT 07/22/2024, 10:42 AM

## 2024-07-22 NOTE — Therapy (Deleted)
 OUTPATIENT PHYSICAL THERAPY NEURO TREATMENT   Patient Name: Bethel Gaglio MRN: [id number] DOB:1956-10-26, 68 y.o., male Today's Date: 07/22/2024   PCP: [name], MD REFERRING PROVIDER: [name], MD   END OF SESSION:    Past Medical History:  Diagnosis Date   High blood pressure    Hyperlipidemia    Memory loss    Prediabetes    Stroke (cerebrum) (HCC)    Stroke [facility])    Past Surgical History:  Procedure Laterality Date   COLONOSCOPY WITH PROPOFOL  N/A 10/05/2015   Procedure: COLONOSCOPY WITH PROPOFOL ;  Surgeon: Lamar ONEIDA Holmes, MD;  Location: [facility] ENDOSCOPY;  Service: Endoscopy;  Laterality: N/A;   IR ANGIO INTRA EXTRACRAN SEL COM CAROTID INNOMINATE BILAT MOD SED  02/05/2019   IR ANGIO VERTEBRAL SEL SUBCLAVIAN INNOMINATE UNI R MOD SED  02/05/2019   IR ANGIO VERTEBRAL SEL VERTEBRAL UNI L MOD SED  02/05/2019   IR US  GUIDE VASC ACCESS RIGHT  02/05/2019   LOOP RECORDER INSERTION N/A 05/22/2019   Procedure: LOOP RECORDER INSERTION;  Surgeon: Ammon Blunt, MD;  Location: [facility] INVASIVE CV LAB;  Service: Cardiovascular;  Laterality: N/A;   Patient Active Problem List   Diagnosis Date Noted   CKD (chronic kidney disease), stage III (HCC) 12/25/2021   Encephalopathy 12/25/2021   COVID-19 12/25/2021   Pneumonia due to COVID-19 virus 12/24/2021   Erectile dysfunction 10/02/2019   Prediabetes 05/17/2019   OSA on CPAP 05/17/2019   Fatty liver 04/17/2019   Gallbladder polyp 04/17/2019   Insomnia 02/28/2019   Hyperlipidemia 01/31/2019   Intracranial atherosclerosis 01/31/2019   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   Vitamin D  deficiency 01/31/2019   CVA (cerebral vascular accident) (HCC) 01/24/2019   Annual physical exam 07/31/2015   Hypertension 07/31/2015    ONSET DATE: Jan 25, 2019   REFERRING DIAG: M62.59 (ICD-10-CM) - Muscle wasting and atrophy, not elsewhere classified, multiple sites   THERAPY DIAG:  No diagnosis found.  Rationale  for Evaluation and Treatment: Rehabilitation  SUBJECTIVE:                                                                                                                                                                                             SUBJECTIVE STATEMENT:  Pt reports overall doing well. No concerns or new updates. Pt and spouse think he is close to d/c. Wanting to maybe after next week. Want updated strength exercises as they feel this is his biggest limiter.     From Eval: Pt reports to PT feeling okay. States he has been doing okay, and that he feels he can  do everything he wants to do. Pt wife sates that he has had some cognitive deficits and has previously been displaying erratic behavior which friends and family corroborate. States that it has been more under control recently with updated meds. Pt presents today at the clinic for a referral of muscle wasting/atrophy.   Reports he jogs 8/10 of a mile in the evenings. Wife reports worry with this as he has no alert system to notify anyone if he falls or gets lost. She reports he witnessed him jogging and claims he does more of a hunched over fast paced walk. Wife described this situation to the SPT in a whisper so that her husband could not hear.   Pt wife reports that he often downplays or misremember's his limitations. AEB their significantly different scores on his LEFS.   Pt's son is a Systems analyst, so he states he will be able to help out as needed with getting rehab equipment, life alert system and help him with HEP.     Pt accompanied by: significant other  PERTINENT HISTORY: From recent visit c Dr. Maree: Rena Sweeden is a 68 year old male with cognitive impairment and a history of stroke who presents for follow-up of memory problems.  He has not experienced significant changes in mood or memory since his last visit. He denies any stroke-like symptoms. He has a history of a fall in May 2025 without serious injuries.  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 His current medications include lamotrigine , gabapentin , and olanzapine .  He experiences right-sided jaw pain radiating towards his ear, which occasionally pops when eating, though the pain is not present today. He has been taking Tylenol  and ibuprofen for this pain. A recent dental evaluation noted changes in his chewing pattern due to missing teeth, which may contribute to the jaw pain.  He has a history of generalized slow shuffling gait, hypophonia, decreased range of motion, mild bradykinesia, mild coagulant of his left hand, and left hand resting tremor. There is no family history of Parkinson's disease, and a skin biopsy for alpha-synuclein accumulation was negative.  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 Initially, there was concern for frontotemporal dementia, but a psychiatrist considered it may be related to bipolar disorder. He has shown improvement with treatment.  He is on multiple medications including rosuvastatin 20 mg, aspirin , Plavix , metformin, lisinopril , and metoprolol . He is concerned about his glucose levels and sweet eating habits and has been trying to manage his diet by avoiding sweets and opting for healthier alternatives.  He engages in physical activity, walking about a mile daily, but there are concerns about his safety during these walks as he does not carry any alert device. He has moved to a new community with no stairs, which is beneficial for his mobility.   PAIN:  Are you having pain? No   PRECAUTIONS: None  RED FLAGS: None   WEIGHT BEARING RESTRICTIONS: No  FALLS: Has patient fallen in last 6 months? Yes. Number of falls 1 approx. 1 mo ago - at a Jet show - Head was turned up looking  at a Jet flying overhead while walking and lost his balance.   LIVING ENVIRONMENT: Lives with: lives with their family and lives with their spouse Lives in: House/apartment Stairs: No Watches granddaughter on Mondays and Fridays  Has following equipment at home: None   PLOF: Independent  PATIENT GOALS: Get stronger, Get back to driving (Failed driving eval twice), increase jogging distance, reduce shuffling c gait.   OBJECTIVE:  Note: Objective measures were completed at Evaluation unless otherwise noted.  DIAGNOSTIC FINDINGS: CT Head 01/24/2019: Small lacunar infarcts within the LEFT thalamus and LEFT external capsule, favored to be remote or subacute. Periventricular white matter changes consistent with small vessel disease. No evidence for acute intracranial abnormality.  MRI brain 01/24/2019: 1.2 cm acute infarction at the base of the brain on the left at the junction of the cerebral peduncle, anterior thalamus and radiating white matter tracts. Extensive old small vessel type infarctions elsewhere throughout the brain as outlined above.  MRI Brain with and without contrast 05/30/2019: No acute intracranial abnormality. Moderate chronic small vessel ischemic disease with multiple chronic lacunar infarcts as above.Unchanged chronic microhemorrhages in the left basal ganglia and pons.   COGNITION: Overall cognitive status: History of cognitive impairments - at baseline    MUSCLE TONE: Increased tone BUE bicep cogwheel rigidity   POSTURE: forward head  LOWER EXTREMITY ROM:     Active  Right Eval Left Eval  Hip flexion [facility]  Hip extension    Hip abduction [facility]  Hip adduction [facility]  Hip internal rotation    Hip external rotation    Knee flexion [facility] WFL  Knee extension [facility]  Ankle dorsiflexion [facility] WFL  Ankle plantarflexion [facility] WFL  Ankle inversion    Ankle eversion     (Blank rows = not tested)  LOWER EXTREMITY MMT:    MMT Right Eval Left Eval  Hip flexion  4 4+   Hip extension    Hip abduction 4+ 4+  Hip adduction 5 5  Hip internal rotation    Hip external rotation    Knee flexion 5 4+  Knee extension 5 5  Ankle dorsiflexion 4+ 4+  Ankle plantarflexion    Ankle inversion    Ankle eversion    (Blank rows = not tested)  BED MOBILITY:  Not tested  TRANSFERS: Sit to stand: Complete Independence  Assistive device utilized: None      GAIT: Findings: Gait Characteristics: step through pattern, decreased arm swing- Right, decreased arm swing- Left, poor foot clearance- Right, and poor foot clearance- Left, Distance walked: 40, Level of assistance: Complete Independence, and Comments:    FUNCTIONAL TESTS:  30 seconds chair stand test = 17  PATIENT SURVEYS:  LEFS  Extreme difficulty/unable (0), Quite a bit of difficulty (1), Moderate difficulty (2), Little difficulty (3), No difficulty (4) Survey date:    Any of your usual work, housework or school activities 4  2. Usual hobbies, recreational or sporting activities 2  3. Getting into/out of the bath 4  4. Walking between rooms 4  5. Putting on socks/shoes 2  6. Squatting  4  7. Lifting an object, like a bag of groceries from the floor 3  8. Performing light activities around your home 4  9. Performing heavy activities around your home 4  10. Getting into/out of a car 3  11. Walking 2 blocks 3  12. Walking 1 mile 3  13. Going up/down 10 stairs (1 flight) 4  14. Standing for 1 hour 3  15.  sitting for 1 hour 4  16. Running on even ground 2  17. Running on uneven ground 2  18. Making sharp turns while running fast 4*  19. Hopping  4*  20. Rolling over in bed 4  Score total:  18    (*) indicates his wife was not able to confirm or deny pt's self reported score. - Above score pt wife fills out as pt is reported to not be completely aware of his deficits                                                                                                                                TREATMENT DATE: 07/18/2024    Therapeutic Activities: dynamic therapeutic activities designed to achieve improved functional performance   STS: x8. Minimal anterior weight shift. X8 with TC's for anterior weight shift. X8 holding 5 KG med ball   Lunge alternating with SUE support: 2x8/side. Min VC's for form/technique.   Squats with BUE support: 2x8, use of chair for TC for depth  SLS: 2x30 sec/LE, SBA next to bar for support if needed.    Self care/ Home mgmt:  Time spent discussing possible d/c next week. Importance of HEP compliance at home. Provided updated HEP reviewing reps/sets/frequency.     PATIENT EDUCATION: Education details: Educated pt as to various life alert systems, reasoning for muscle testing, tone  Person educated: Patient and Spouse Education method: Explanation Education comprehension: verbalized understanding  HOME EXERCISE PROGRAM: Access Code: [email or web address] URL: [email or web address] Date: 07/18/2024 Prepared by: Dorina Kingfisher  Exercises - Standard Lunge  - 1 x daily - 3 x weekly - 2 sets - 8 reps - Squat  - 1 x daily - 3 x weekly - 3 sets - 8 reps - Single Leg Stance  - 1 x daily - 7 x weekly - 1 sets - 3 reps - 30 hold  GOALS: Goals reviewed with patient? No  SHORT TERM GOALS: Target date: 07/08/2024    Pt will have no difficulty performing and maintaining HEP. Baseline:  Not yet given Goal status: INITIAL    LONG TERM GOALS: Target date: 07/08/2024    Pt will display no difficulty lifting a 5-10 pound object off the floor with no signs of imbalance for improved safety with functional tasks and chores Baseline: A little bit of difficulty Goal status: INITIAL  2.  Pt will increase reps during 30 second chair stand test by 3 to demonstrate improved LE power for functional activities.  Baseline: 17 Goal status: INITIAL  4.  Pt will increase his score on the LEFS by 9 points to indicate improved capacity for functional  activities.  Baseline: 67 Goal status: INITIAL  5.  Pt will increase score on mini-best by 3 points in order to decrease risk for falls associated with static and dynamic balance.  Baseline: 6/19: 22 Goal status: INITIAL  6.  Pt will be able to jog 1 mile independently with no difficulty to demonstrate ability to perform endurance and strength training as part of his personal home exercise program Baseline: a little bit of difficulty, barely a jog ( more  like fast walk) per his wife   Goal status: INITIAL     ASSESSMENT:  CLINICAL IMPRESSION: Focus of session on HEP updates. Pt and spouse verbalizing possible discharge after next week sessions. Time spent updating HEP with progressive strengthening exercises. Encouraged pt and spouse to f/u next session and report tolerance for updated HEP and will modify reps/sets/frequency as needed. Pt and spouse understanding. Pt will continue to benefit from skilled physical therapy intervention to address impairments, improve QOL, and attain therapy goals.     OBJECTIVE IMPAIRMENTS: Abnormal gait, decreased cognition, decreased knowledge of condition, decreased safety awareness, impaired perceived functional ability, and impaired tone.   ACTIVITY LIMITATIONS: carrying and lifting  PARTICIPATION LIMITATIONS: driving and community activity  PERSONAL FACTORS: Time since onset of injury/illness/exacerbation and 1-2 comorbidities: CVA and dementia are also affecting patient's functional outcome.   REHAB POTENTIAL: Good  CLINICAL DECISION MAKING: Stable/uncomplicated  EVALUATION COMPLEXITY: Moderate  PLAN:  PT FREQUENCY: 2x/week  PT DURATION: 6 weeks  PLANNED INTERVENTIONS: 97110-Therapeutic exercises, 97530- Therapeutic activity, [id number]- Neuromuscular re-education, 97535- Self Care, and [id number]- Manual therapy  PLAN FOR NEXT SESSION:  FOCUS ON IMPLEMENTING and issuing HEP Observed patient jog and ensure safety- implement safety/gait/run  safety Continue with LE strengthening examine and address R shoulder if warranted Balance training to address deficits according to mini-BESTest. Continue with  Blaze pod dual tasking with intermittent jogs or head turns. Educate caregiver on sundowning for dementia care as appropriate.   Chyrl London, PT Physical Therapist- [facility] 07/22/2024, 8:32 AM

## 2024-07-23 ENCOUNTER — Ambulatory Visit: Admitting: Physical Therapy

## 2024-07-24 ENCOUNTER — Ambulatory Visit: Admitting: Physical Therapy

## 2024-07-24 DIAGNOSIS — R2689 Other abnormalities of gait and mobility: Secondary | ICD-10-CM

## 2024-07-24 DIAGNOSIS — R269 Unspecified abnormalities of gait and mobility: Secondary | ICD-10-CM | POA: Diagnosis not present

## 2024-07-24 NOTE — Therapy (Signed)
 OUTPATIENT PHYSICAL THERAPY NEURO TREATMENT/ Discharge therapy/ Recert    Patient Name: Jesse Black MRN: [id number] DOB:[birth year], 68 y.o., male Today's Date: 07/24/2024   PCP: Jesse DOROTHA Fernande DOUGLAS, MD REFERRING PROVIDER: [name], MD   END OF SESSION:  PT End of Session - 07/24/24 1452     Visit Number 7    Number of Visits 12    Date for PT Re-Evaluation 07/24/24    Progress Note Due on Visit 7    PT Start Time 1100    PT Stop Time 1133    PT Time Calculation (min) 33 min    Equipment Utilized During Treatment Gait belt    Activity Tolerance Patient tolerated treatment well    Behavior During Therapy WFL for tasks assessed/performed           Past Medical History:  Diagnosis Date   High blood pressure    Hyperlipidemia    Memory loss    Prediabetes    Stroke (cerebrum) (HCC)    Stroke [facility])    Past Surgical History:  Procedure Laterality Date   COLONOSCOPY WITH PROPOFOL  N/A 10/05/2015   Procedure: COLONOSCOPY WITH PROPOFOL ;  Surgeon: Jesse ONEIDA Holmes, MD;  Location: [facility] ENDOSCOPY;  Service: Endoscopy;  Laterality: N/A;   IR ANGIO INTRA EXTRACRAN SEL COM CAROTID INNOMINATE BILAT MOD SED  02/05/2019   IR ANGIO VERTEBRAL SEL SUBCLAVIAN INNOMINATE UNI R MOD SED  02/05/2019   IR ANGIO VERTEBRAL SEL VERTEBRAL UNI L MOD SED  02/05/2019   IR US  GUIDE VASC ACCESS RIGHT  02/05/2019   LOOP RECORDER INSERTION N/A 05/22/2019   Procedure: LOOP RECORDER INSERTION;  Surgeon: Jesse Blunt, MD;  Location: [facility] INVASIVE CV LAB;  Service: Cardiovascular;  Laterality: N/A;   Patient Active Problem List   Diagnosis Date Noted   CKD (chronic kidney disease), stage III (HCC) 12/25/2021   Encephalopathy 12/25/2021   COVID-19 12/25/2021   Pneumonia due to COVID-19 virus 12/24/2021   Erectile dysfunction 10/02/2019   Prediabetes 05/17/2019   OSA on CPAP 05/17/2019   Fatty liver 04/17/2019   Gallbladder polyp 04/17/2019   Insomnia 02/28/2019   Hyperlipidemia  01/31/2019   Intracranial atherosclerosis 01/31/2019   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   Vitamin D  deficiency 01/31/2019   CVA (cerebral vascular accident) (HCC) 01/24/2019   Annual physical exam 07/31/2015   Hypertension 07/31/2015    ONSET DATE: Jan 25, 2019   REFERRING DIAG: M62.59 (ICD-10-CM) - Muscle wasting and atrophy, not elsewhere classified, multiple sites   THERAPY DIAG:  No diagnosis found.  Rationale for Evaluation and Treatment: Rehabilitation  SUBJECTIVE:  SUBJECTIVE STATEMENT:  Pt reports overall doing well. Has bene enjoying new exercise routine and has been completing it some with his wife.  Patient and caregiver are both pleased with therapy progress and are okay with discharge at this time.    From Eval: Pt reports to PT feeling okay. States he has been doing okay, and that he feels he can do everything he wants to do. Pt wife sates that he has had some cognitive deficits and has previously been displaying erratic behavior which friends and family corroborate. States that it has been more under control recently with updated meds. Pt presents today at the clinic for a referral of muscle wasting/atrophy.   Reports he jogs 8/10 of a mile in the evenings. Wife reports worry with this as he has no alert system to notify anyone if he falls or gets lost. She reports he witnessed him jogging and claims he does more of a hunched over fast paced walk. Wife described this situation to the SPT in a whisper so that her husband could not hear.   Pt wife reports that he often downplays or misremember's his limitations. AEB their significantly different scores on his LEFS.   Pt's son is a Systems analyst, so he states he will be able to help out as needed with getting rehab  equipment, life alert system and help him with HEP.     Pt accompanied by: significant other  PERTINENT HISTORY: From recent visit c Dr. Maree: Jesse Black is a 68 year old male with cognitive impairment and a history of stroke who presents for follow-up of memory problems.  He has not experienced significant changes in mood or memory since his last visit. He denies any stroke-like symptoms. He has a history of a fall in May 2025 without serious injuries. He has been evaluated for cognitive impairment post-stroke versus mixed dementia at the [facility] and has seen multiple specialists. His current medications include lamotrigine , gabapentin , and olanzapine .  He experiences right-sided jaw pain radiating towards his ear, which occasionally pops when eating, though the pain is not present today. He has been taking Tylenol  and ibuprofen for this pain. A recent dental evaluation noted changes in his chewing pattern due to missing teeth, which may contribute to the jaw pain.  He has a history of generalized slow shuffling gait, hypophonia, decreased range of motion, mild bradykinesia, mild coagulant of his left hand, and left hand resting tremor. There is no family history of Parkinson's disease, and a skin biopsy for alpha-synuclein accumulation was negative.  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 Initially, there was concern for frontotemporal dementia, but a psychiatrist considered it may be related to bipolar disorder. He has shown improvement with treatment.  He is on multiple medications including rosuvastatin 20 mg, aspirin , Plavix , metformin, lisinopril , and metoprolol . He is concerned about his glucose levels and sweet eating habits and has been trying to manage his diet by avoiding sweets and opting for healthier alternatives.  He engages in physical  activity, walking about a mile daily, but there are concerns about his safety during these walks as he does not carry any alert device. He has moved to a new community with no stairs, which is beneficial for his mobility.   PAIN:  Are you having pain? No   PRECAUTIONS: None  RED FLAGS: None   WEIGHT BEARING RESTRICTIONS: No  FALLS:  Has patient fallen in last 6 months? Yes. Number of falls 1 approx. 1 mo ago - at a Jet show - Head was turned up looking at a Jet flying overhead while walking and lost his balance.   LIVING ENVIRONMENT: Lives with: lives with their family and lives with their spouse Lives in: House/apartment Stairs: No Watches granddaughter on Mondays and Fridays Has following equipment at home: None   PLOF: Independent  PATIENT GOALS: Get stronger, Get back to driving (Failed driving eval twice), increase jogging distance, reduce shuffling c gait.   OBJECTIVE:  Note: Objective measures were completed at Evaluation unless otherwise noted.  DIAGNOSTIC FINDINGS: CT Head 01/24/2019: Small lacunar infarcts within the LEFT thalamus and LEFT external capsule, favored to be remote or subacute. Periventricular white matter changes consistent with small vessel disease. No evidence for acute intracranial abnormality.  MRI brain 01/24/2019: 1.2 cm acute infarction at the base of the brain on the left at the junction of the cerebral peduncle, anterior thalamus and radiating white matter tracts. Extensive old small vessel type infarctions elsewhere throughout the brain as outlined above.  MRI Brain with and without contrast 05/30/2019: No acute intracranial abnormality. Moderate chronic small vessel ischemic disease with multiple chronic lacunar infarcts as above.Unchanged chronic microhemorrhages in the left basal ganglia and pons.   COGNITION: Overall cognitive status: History of cognitive impairments - at baseline    MUSCLE TONE: Increased tone BUE bicep cogwheel  rigidity   POSTURE: forward head  LOWER EXTREMITY ROM:     Active  Right Eval Left Eval  Hip flexion [facility]  Hip extension    Hip abduction [facility]  Hip adduction [facility]  Hip internal rotation    Hip external rotation    Knee flexion [facility] WFL  Knee extension [facility]  Ankle dorsiflexion [facility] WFL  Ankle plantarflexion [facility] WFL  Ankle inversion    Ankle eversion     (Blank rows = not tested)  LOWER EXTREMITY MMT:    MMT Right Eval Left Eval  Hip flexion 4 4+   Hip extension    Hip abduction 4+ 4+  Hip adduction 5 5  Hip internal rotation    Hip external rotation    Knee flexion 5 4+  Knee extension 5 5  Ankle dorsiflexion 4+ 4+  Ankle plantarflexion    Ankle inversion    Ankle eversion    (Blank rows = not tested)  BED MOBILITY:  Not tested  TRANSFERS: Sit to stand: Complete Independence  Assistive device utilized: None      GAIT: Findings: Gait Characteristics: step through pattern, decreased arm swing- Right, decreased arm swing- Left, poor foot clearance- Right, and poor foot clearance- Left, Distance walked: 40, Level of assistance: Complete Independence, and Comments:    FUNCTIONAL TESTS:  30 seconds chair stand test = 17  PATIENT SURVEYS:  LEFS  Extreme difficulty/unable (0), Quite a bit of difficulty (1), Moderate difficulty (2), Little difficulty (3), No difficulty (4) Survey date:    Any of your usual work, housework or school activities 4  2. Usual hobbies, recreational or sporting activities 2  3. Getting into/out of the bath 4  4. Walking between rooms 4  5. Putting on socks/shoes 2  6. Squatting  4  7. Lifting an object, like a bag of groceries from the floor 3  8. Performing light activities around your home 4  9. Performing heavy activities around your home 4  10. Getting into/out of a car  3  11. Walking 2 blocks 3  12. Walking 1 mile 3  13. Going up/down 10 stairs (1 flight) 4  14. Standing for 1 hour 3  15.  sitting for 1 hour 4   16. Running on even ground 2  17. Running on uneven ground 2  18. Making sharp turns while running fast 4*  19. Hopping  4*  20. Rolling over in bed 4  Score total:  73    (*) indicates his wife was not able to confirm or deny pt's self reported score. - Above score pt wife fills out as pt is reported to not be completely aware of his deficits                                                                                                                               TREATMENT DATE: 07/24/24   Physical Performance Test or Measurement: a  physical performance test(s) or measurement (eg,  musculoskeletal, functional capacity), with written report,  each 15 mins   30 sec chair stand test: pt achieves 20 STS in 30 sec  The 30-second chair stand test is a simple assessment of lower body strength and endurance. Participants are instructed to sit in a chair, cross their arms over their chest, and stand up and sit down as many times as possible in 30 seconds. The number of successful repetitions is recorded, and this score can be compared to age- and gender-specific norms to assess functional fitness  Pt scores 25 / 28 on mini BEST balance test. Scores < 16 indicate increased risk for falls [place] [place] [place]) and MCID is 4 (Godi,et al, 2013)    [facility] PT Assessment - 07/24/24 0001       Mini-BESTest   Sit To Stand Normal: Comes to stand without use of hands and stabilizes independently. (P)     Rise to Toes Normal: Stable for 3 s with maximum height. (P)     Stand on one leg (left) Moderate: < 20 s (P)     Stand on one leg (right) Normal: 20 s. (P)     Stand on one leg - lowest score 1 (P)     Compensatory Stepping Correction - Forward Normal: Recovers independently with a single, large step (second realignement is allowed). (P)     Compensatory Stepping Correction - Backward Normal: Recovers independently with a single, large step (P)     Compensatory Stepping Correction - Left  Lateral Normal: Recovers independently with 1 step (crossover or lateral OK) (P)     Compensatory Stepping Correction - Right Lateral Normal: Recovers independently with 1 step (crossover or lateral OK) (P)     Stepping Corredtion Lateral - lowest score 2 (P)     Stance - Feet together, eyes open, firm surface  Normal: 30s (P)     Stance - Feet together, eyes closed, foam surface  Normal: 30s (P)  Incline - Eyes Closed Moderate: Stands independently < 30s OR aligns with surface (P)     Change in Gait Speed Normal: Significantly changes walkling speed without imbalance (P)     Walk with head turns - Horizontal Normal: performs head turns with no change in gait speed and good balance (P)     Walk with pivot turns Normal: Turns with feet close FAST (< 3 steps) with good balance. (P)     Step over obstacles Normal: Able to step over box with minimal change of gait speed and with good balance. (P)     Timed UP & GO with Dual Task Moderate: Dual Task affects either counting OR walking (>10%) when compared to the TUG without Dual Task. (P)    dual task counting backwards from 100 by 1s since other task was not doable   Mini-BEST total score 25 (P)          TA  Pt tasked with picking up 15# weight from the floor and placing on chair, pt does with good form and relative ease     PATIENT EDUCATION: Education details: Educated pt as to various life alert systems, reasoning for muscle testing, tone  Person educated: Patient and Spouse Education method: Explanation Education comprehension: verbalized understanding  HOME EXERCISE PROGRAM: Access Code: [email or web address] URL: [email or web address] Date: 07/18/2024 Prepared by: Dorina Kingfisher  Exercises - Standard Lunge  - 1 x daily - 3 x weekly - 2 sets - 8 reps - Squat  - 1 x daily - 3 x weekly - 3 sets - 8 reps - Single Leg Stance  - 1 x daily - 7 x weekly - 1 sets - 3 reps - 30 hold  GOALS: Goals reviewed with patient? No  SHORT  TERM GOALS: Target date: 07/08/2024    Pt will have no difficulty performing and maintaining HEP. Baseline:  Not yet given 8/13: completing regularly  Goal status: MET    LONG TERM GOALS: Target date: 07/24/24    Pt will display no difficulty lifting a 5-10 pound object off the floor with no signs of imbalance for improved safety with functional tasks and chores Baseline: A little bit of difficulty Goal status: MET  2.  Pt will increase reps during 30 second chair stand test by 3 to demonstrate improved LE power for functional activities.  Baseline: 17 8/13:20 Goal status: MET  4.  Pt will increase his score on the LEFS by 9 points to indicate improved capacity for functional activities.  Baseline: 67 8/13:64 Goal status: NOT MET  5.  Pt will increase score on mini-best by 3 points in order to decrease risk for falls associated with static and dynamic balance.  Baseline: 6/19: 22 8/13:25 Goal status: MET  6.  Pt will be able to jog 1 mile independently with no difficulty to demonstrate ability to perform endurance and strength training as part of his personal home exercise program Baseline: a little bit of difficulty, barely a jog ( more like fast walk) per his wife   8/13: pt going about 2 miles when running/ jogging at home, using tracking watch to ensure safety per his wife  Goal status: MET     ASSESSMENT:  CLINICAL IMPRESSION: Patient presents with good motivation for completion of physical therapy activities.  With exception of lower extremity functional scale patient has met all of his long-term goals including improvement in his mini best score, 30 sec chair stand score, and his ability to pick up objects  from the floor.  All indicating improvement in overall function as well as improvement in patient's balance and safety with the home and community.  Patient will be discharged to skilled physical therapy at this time with home exercise program in place and with  progress and plan discussed thoroughly with caregiver.  OBJECTIVE IMPAIRMENTS: Abnormal gait, decreased cognition, decreased knowledge of condition, decreased safety awareness, impaired perceived functional ability, and impaired tone.   ACTIVITY LIMITATIONS: carrying and lifting  PARTICIPATION LIMITATIONS: driving and community activity  PERSONAL FACTORS: Time since onset of injury/illness/exacerbation and 1-2 comorbidities: CVA and dementia are also affecting patient's functional outcome.   REHAB POTENTIAL: Good  CLINICAL DECISION MAKING: Stable/uncomplicated  EVALUATION COMPLEXITY: Moderate  PLAN:  PT FREQUENCY: 2x/week  PT DURATION: 6 weeks  PLANNED INTERVENTIONS: [id number]-Therapeutic exercises, 97530- Therapeutic activity, [id number]- Neuromuscular re-education, 97535- Self Care, and [id number]- Manual therapy  PLAN FOR NEXT SESSION:  FOCUS ON IMPLEMENTING and issuing HEP Observed patient jog and ensure safety- implement safety/gait/run safety Continue with LE strengthening examine and address R shoulder if warranted Balance training to address deficits according to mini-BESTest. Continue with  Blaze pod dual tasking with intermittent jogs or head turns. Educate caregiver on sundowning for dementia care as appropriate.   Note: Portions of this document were prepared using Dragon voice recognition software and although reviewed may contain unintentional dictation errors in syntax, grammar, or spelling.  Lonni KATHEE Gainer PT ,DPT Physical Therapist- [facility]   07/24/2024, 2:54 PM

## 2024-07-25 ENCOUNTER — Ambulatory Visit: Admitting: Physical Therapy

## 2024-07-30 ENCOUNTER — Ambulatory Visit: Admitting: Physical Therapy

## 2024-08-01 ENCOUNTER — Ambulatory Visit: Admitting: Physical Therapy

## 2024-08-06 ENCOUNTER — Ambulatory Visit: Admitting: Physical Therapy

## 2024-08-08 ENCOUNTER — Ambulatory Visit: Admitting: Physical Therapy

## 2024-08-13 ENCOUNTER — Ambulatory Visit: Admitting: Physical Therapy

## 2024-08-15 ENCOUNTER — Ambulatory Visit: Admitting: Physical Therapy

## 2024-08-20 ENCOUNTER — Ambulatory Visit: Admitting: Physical Therapy

## 2024-08-22 ENCOUNTER — Ambulatory Visit: Admitting: Physical Therapy
